# Patient Record
Sex: Female | Born: 1955 | Race: Black or African American | Hispanic: No | Marital: Single | Smoking: Former smoker
Health system: Southern US, Community
[De-identification: ages and names within clinical notes are randomized; demographics above are authoritative.]

## PROBLEM LIST (undated history)

## (undated) DIAGNOSIS — E785 Hyperlipidemia, unspecified: Secondary | ICD-10-CM

## (undated) DIAGNOSIS — M1990 Unspecified osteoarthritis, unspecified site: Secondary | ICD-10-CM

## (undated) DIAGNOSIS — I10 Essential (primary) hypertension: Secondary | ICD-10-CM

## (undated) DIAGNOSIS — F32A Depression, unspecified: Secondary | ICD-10-CM

## (undated) DIAGNOSIS — D649 Anemia, unspecified: Secondary | ICD-10-CM

## (undated) DIAGNOSIS — Z87410 Personal history of cervical dysplasia: Secondary | ICD-10-CM

## (undated) DIAGNOSIS — T7840XA Allergy, unspecified, initial encounter: Secondary | ICD-10-CM

## (undated) DIAGNOSIS — K219 Gastro-esophageal reflux disease without esophagitis: Secondary | ICD-10-CM

## (undated) DIAGNOSIS — G709 Myoneural disorder, unspecified: Secondary | ICD-10-CM

## (undated) HISTORY — DX: Allergy, unspecified, initial encounter: T78.40XA

## (undated) HISTORY — DX: Unspecified osteoarthritis, unspecified site: M19.90

## (undated) HISTORY — DX: Anemia, unspecified: D64.9

## (undated) HISTORY — DX: Myoneural disorder, unspecified: G70.9

## (undated) HISTORY — DX: Hyperlipidemia, unspecified: E78.5

## (undated) HISTORY — DX: Depression, unspecified: F32.A

## (undated) HISTORY — DX: Essential (primary) hypertension: I10

---

## 2006-12-22 HISTORY — PX: LEEP: SHX91

## 2010-11-05 ENCOUNTER — Other Ambulatory Visit: Admission: RE | Admit: 2010-11-05 | Discharge: 2010-11-05 | Payer: Self-pay | Admitting: Obstetrics and Gynecology

## 2010-11-18 ENCOUNTER — Ambulatory Visit (HOSPITAL_COMMUNITY)
Admission: RE | Admit: 2010-11-18 | Discharge: 2010-11-18 | Payer: Self-pay | Source: Home / Self Care | Admitting: Obstetrics and Gynecology

## 2011-11-03 ENCOUNTER — Other Ambulatory Visit (HOSPITAL_COMMUNITY): Payer: Self-pay | Admitting: Obstetrics and Gynecology

## 2011-11-03 DIAGNOSIS — Z1231 Encounter for screening mammogram for malignant neoplasm of breast: Secondary | ICD-10-CM

## 2011-11-11 ENCOUNTER — Other Ambulatory Visit (HOSPITAL_COMMUNITY)
Admission: RE | Admit: 2011-11-11 | Discharge: 2011-11-11 | Disposition: A | Discharge status: Home / Self Care | Payer: 59 | Source: Ambulatory Visit | Attending: Obstetrics and Gynecology | Admitting: Obstetrics and Gynecology

## 2011-11-11 DIAGNOSIS — Z01419 Encounter for gynecological examination (general) (routine) without abnormal findings: Secondary | ICD-10-CM | POA: Insufficient documentation

## 2011-12-03 ENCOUNTER — Ambulatory Visit (HOSPITAL_COMMUNITY)
Admission: RE | Admit: 2011-12-03 | Discharge: 2011-12-03 | Disposition: A | Discharge status: Home / Self Care | Payer: 59 | Source: Ambulatory Visit | Attending: Obstetrics and Gynecology | Admitting: Obstetrics and Gynecology

## 2011-12-03 DIAGNOSIS — Z1231 Encounter for screening mammogram for malignant neoplasm of breast: Secondary | ICD-10-CM | POA: Insufficient documentation

## 2016-09-23 ENCOUNTER — Other Ambulatory Visit: Payer: Self-pay | Admitting: Internal Medicine

## 2016-09-23 DIAGNOSIS — Z1231 Encounter for screening mammogram for malignant neoplasm of breast: Secondary | ICD-10-CM

## 2016-09-29 ENCOUNTER — Ambulatory Visit
Admission: RE | Admit: 2016-09-29 | Discharge: 2016-09-29 | Disposition: A | Discharge status: Home / Self Care | Payer: Commercial Managed Care - HMO | Source: Ambulatory Visit | Attending: Internal Medicine | Admitting: Internal Medicine

## 2016-09-29 DIAGNOSIS — Z1231 Encounter for screening mammogram for malignant neoplasm of breast: Secondary | ICD-10-CM

## 2019-12-29 ENCOUNTER — Ambulatory Visit: Payer: Self-pay | Attending: Internal Medicine

## 2019-12-29 DIAGNOSIS — Z20822 Contact with and (suspected) exposure to covid-19: Secondary | ICD-10-CM

## 2019-12-31 LAB — NOVEL CORONAVIRUS, NAA: SARS-CoV-2, NAA: NOT DETECTED

## 2020-03-24 ENCOUNTER — Ambulatory Visit: Payer: Self-pay | Attending: Internal Medicine

## 2020-03-24 DIAGNOSIS — Z23 Encounter for immunization: Secondary | ICD-10-CM

## 2020-03-24 NOTE — Progress Notes (Signed)
   Covid-19 Vaccination Clinic  Name:  Madison Becker    MRN: [id number] DOB: 1956-10-07  03/24/2020  [name] was observed post Covid-19 immunization for 15 minutes without incident. She was provided with Vaccine Information Sheet and instruction to access the V-Safe system.   [name] was instructed to call 911 with any severe reactions post vaccine: Marland Kitchen Difficulty breathing  . Swelling of face and throat  . A fast heartbeat  . A bad rash all over body  . Dizziness and weakness   Immunizations Administered    Name Date Dose VIS Date Route   Pfizer COVID-19 Vaccine 03/24/2020  8:23 AM 0.3 mL 12/02/2019 Intramuscular   Manufacturer: [place]   Lot: [id number]   [place]: [id number]

## 2020-04-17 ENCOUNTER — Ambulatory Visit: Payer: Self-pay

## 2020-04-23 ENCOUNTER — Ambulatory Visit: Payer: Self-pay | Attending: Internal Medicine

## 2020-04-23 DIAGNOSIS — Z23 Encounter for immunization: Secondary | ICD-10-CM

## 2020-04-23 NOTE — Progress Notes (Signed)
   Covid-19 Vaccination Clinic  Name:  Madison Becker    MRN: [id number] DOB: [birth year]  04/23/2020  [name] was observed post Covid-19 immunization for 15 minutes without incident. She was provided with Vaccine Information Sheet and instruction to access the V-Safe system.   [name] was instructed to call 911 with any severe reactions post vaccine: Marland Kitchen Difficulty breathing  . Swelling of face and throat  . A fast heartbeat  . A bad rash all over body  . Dizziness and weakness   Immunizations Administered    Name Date Dose VIS Date Route   Pfizer COVID-19 Vaccine 04/23/2020 12:35 PM 0.3 mL 02/15/2019 Intramuscular   Manufacturer: [place]   Lot: [id number]   [place]: [id number]

## 2021-09-21 DIAGNOSIS — C50919 Malignant neoplasm of unspecified site of unspecified female breast: Secondary | ICD-10-CM

## 2021-09-21 HISTORY — DX: Malignant neoplasm of unspecified site of unspecified female breast: C50.919

## 2021-10-04 ENCOUNTER — Other Ambulatory Visit: Payer: Self-pay | Admitting: Surgery

## 2021-10-09 ENCOUNTER — Other Ambulatory Visit: Payer: Self-pay | Admitting: Radiation Oncology

## 2021-10-09 ENCOUNTER — Ambulatory Visit
Admission: RE | Admit: 2021-10-09 | Discharge: 2021-10-09 | Disposition: A | Discharge status: Home / Self Care | Payer: Self-pay | Source: Ambulatory Visit | Attending: Radiation Oncology | Admitting: Radiation Oncology

## 2021-10-09 ENCOUNTER — Telehealth: Payer: Self-pay | Admitting: Hematology and Oncology

## 2021-10-09 ENCOUNTER — Inpatient Hospital Stay
Admission: RE | Admit: 2021-10-09 | Discharge: 2021-10-09 | Disposition: A | Discharge status: Home / Self Care | Payer: Self-pay | Source: Ambulatory Visit | Attending: Radiation Oncology | Admitting: Radiation Oncology

## 2021-10-09 ENCOUNTER — Other Ambulatory Visit: Payer: Self-pay | Admitting: Surgery

## 2021-10-09 DIAGNOSIS — Z853 Personal history of malignant neoplasm of breast: Secondary | ICD-10-CM

## 2021-10-09 NOTE — Telephone Encounter (Signed)
Scheduled appt per 10/19 referral. Pt is aware of appt date and time.  

## 2021-10-21 NOTE — Progress Notes (Signed)
New Breast Cancer Diagnosis: Left Breast UIQ  Did patient present with symptoms (if so, please note symptoms) or screening mammography?:Palpable mass    Location and Extent of disease :left breast. Located at 10.30 position, measured  2.2 cm in greatest dimension. Adenopathy no.  MRI Breast 11/02/2021:  Histology per Pathology Report: grade 3, Invasive Ductal Carcinoma and DCIS   Receptor Status: ER(positive), PR (positive), Her2-neu (negative), Ki-(50%)  Surgeon and surgical plan, if any:  Dr. Ninfa Linden 10/04/2021 -She has 2 separate areas in the breast, 1 with invasive cancer and the other with DCIS. -Total size of both areas is greater than 6 cm, I am uncertain of how far apart these 2 areas are. -I suspect this will eventually need a mastectomy given it is multiple quadrants. -We will get a MRI to determine the exact size as well as to make sure there is nothing in the left breast preoperatively. -Referral to medical and radiation oncology. -She reports currently she is not interested in immediate reconstruction for work reasons. -I will tentatively schedule her for a mastectomy as she is leaning that way. -She will see the oncologist and have the MRI preoperatively.   I will call her back with the results and again we will determine how to further proceed.   Medical oncologist, treatment if any:   Dr. Lindi Adie 10/28/2021  Family History of Breast/Ovarian/Prostate Cancer: Maternal great Aunt had breast cancer.  Lymphedema issues, if any: No     Pain issues, if any: has mild tenderness     SAFETY ISSUES: Prior radiation? No Pacemaker/ICD? No Possible current pregnancy? Postmenopausal Is the patient on methotrexate? No  Current Complaints / other details:

## 2021-10-22 ENCOUNTER — Other Ambulatory Visit: Payer: Self-pay

## 2021-10-22 ENCOUNTER — Encounter: Payer: Self-pay | Admitting: Radiation Oncology

## 2021-10-22 ENCOUNTER — Ambulatory Visit
Admission: RE | Admit: 2021-10-22 | Discharge: 2021-10-22 | Disposition: A | Discharge status: Home / Self Care | Payer: BC Managed Care – PPO | Source: Ambulatory Visit | Attending: Radiation Oncology | Admitting: Radiation Oncology

## 2021-10-22 VITALS — BP 139/74 | HR 64 | Temp 97.7°F | Resp 18 | Ht 64.5 in | Wt 225.2 lb

## 2021-10-22 DIAGNOSIS — Z170 Estrogen receptor positive status [ER+]: Secondary | ICD-10-CM | POA: Insufficient documentation

## 2021-10-22 DIAGNOSIS — C50212 Malignant neoplasm of upper-inner quadrant of left female breast: Secondary | ICD-10-CM | POA: Diagnosis present

## 2021-10-22 DIAGNOSIS — Z87891 Personal history of nicotine dependence: Secondary | ICD-10-CM | POA: Insufficient documentation

## 2021-10-22 DIAGNOSIS — K219 Gastro-esophageal reflux disease without esophagitis: Secondary | ICD-10-CM | POA: Diagnosis not present

## 2021-10-22 DIAGNOSIS — Z803 Family history of malignant neoplasm of breast: Secondary | ICD-10-CM | POA: Diagnosis not present

## 2021-10-22 HISTORY — DX: Personal history of cervical dysplasia: Z87.410

## 2021-10-22 HISTORY — DX: Gastro-esophageal reflux disease without esophagitis: K21.9

## 2021-10-22 NOTE — Progress Notes (Signed)
Radiation Oncology         [phone]) [phone] ________________________________  Name: Madison Becker        MRN: [id number]  Date of Service: 10/22/2021 DOB: 1956/09/13  [id number], No Pcp Per (Inactive)  [name], MD     REFERRING PHYSICIAN: [name], MD   DIAGNOSIS: The encounter diagnosis was Malignant neoplasm of upper-inner quadrant of left breast in female, estrogen receptor positive ([place]).   HISTORY OF PRESENT ILLNESS: Madison Becker is a 65 y.o. female seen at the request of Dr. Ninfa Linden who noted a palpable mass in the left breast. Diagnostic imaging revealed a mass in the 10:30 position measuring up to 2.2 cm and a separate area, the total distance was greater than 6 cm. 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 Her tumor was ER/PR positive, HER2 negative with a Ki 67 of 50%. Her DCIS was also ER/PR positive. She is planning to undergo an MRI for extent of disease on 11/02/21. She may undergo mastectomy versus breast conservation surgery. She's seen today if she is to undergo breast conservation. She is scheduled to meet with Dr. Lindi Adie as well next Monday.  PREVIOUS RADIATION THERAPY: No   PAST MEDICAL HISTORY:  Past Medical History:  Diagnosis Date   Breast cancer ([place]) 09/2021   GERD (gastroesophageal reflux disease)    History of cervical dysplasia        PAST SURGICAL HISTORY: Past Surgical History:  Procedure Laterality Date   LEEP  2008      FAMILY HISTORY:  Family History  Problem Relation Age of Onset   Hypertension Mother    Deep vein thrombosis Mother    Coronary artery disease Father    Breast cancer Maternal Aunt      SOCIAL HISTORY:  reports that she has quit smoking. Her smoking use  included cigarettes. She has never used smokeless tobacco. She reports current alcohol use. She reports that she does not use drugs. The patient is single and lives in [place]. 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   ALLERGIES: Patient has no allergy information on record.   MEDICATIONS:  No current outpatient medications on file.   No current facility-administered medications for this encounter.     REVIEW OF SYSTEMS: On review of systems, the patient reports that she is doing pretty well overall. She's trying not to over investigate her diagnosis on the Internet. She is contemplating what her recovery and cosmetic outcomes would be like between breast conservation and mastectomy options. She's done well since biopsies but has had some soreness over the biopsy location. No other complaints are verbalized.       PHYSICAL EXAM:  Wt Readings from Last 3 Encounters:  10/22/21 225 lb 4 oz (102.2 kg)   Temp Readings from Last 3 Encounters:  10/22/21 97.7 F (36.5 C) (Temporal)   BP Readings from Last 3 Encounters:  10/22/21 139/74   Pulse Readings from Last 3 Encounters:  10/22/21 64    In general this is a well appearing African American female in no acute distress. She's alert and oriented x4 and appropriate throughout the examination. Cardiopulmonary assessment is negative for acute distress and she exhibits normal effort. Bilateral breast exam is deferred.    ECOG =  1  0 - Asymptomatic (Fully active, able to carry on all predisease activities without restriction)  1 - Symptomatic but completely ambulatory (Restricted in physically strenuous activity but ambulatory and able to carry out work of a light or sedentary nature. For example, light housework, office work)  2 - Symptomatic, <50% in bed during the day (Ambulatory and capable of all self care but unable to carry out any work activities. Up and about more than 50% of waking hours)  3 -  Symptomatic, >50% in bed, but not bedbound (Capable of only limited self-care, confined to bed or chair 50% or more of waking hours)  4 - Bedbound (Completely disabled. Cannot carry on any self-care. Totally confined to bed or chair)  5 - Death   Eustace Pen MM, Creech RH, Tormey DC, et al. [phone]). "Toxicity and response criteria of the [facility]". [place] Oncol. 5 (6): 649-55    LABORATORY DATA:  No results found for: WBC, HGB, HCT, MCV, PLT No results found for: NA, K, CL, CO2 No results found for: ALT, AST, GGT, ALKPHOS, BILITOT    RADIOGRAPHY: No results found.     IMPRESSION/PLAN: 1. 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 Dr. Lisbeth Renshaw discusses the pathology findings and reviews the nature of early stage breast disease. Dr. Lisbeth Renshaw agrees with the role of MRI for extent of disease. She may either undergo breast conservation versus mastectomy with sentinel node sampling. Depending on the size of the final tumor measurements rendered by pathology, the tumor may be tested for Oncotype Dx score to determine a role for systemic therapy. Provided that chemotherapy is not indicated, the patient's course would then be followed by external radiotherapy to the breast  to reduce risks of local recurrence followed by antiestrogen therapy if she has breast conservation surgery. We discussed the risks, benefits, short, and long term effects of radiotherapy, as well as the curative intent, and the patient is interested in proceeding if she is able to undergo breast conservation. Dr. Lisbeth Renshaw discusses the delivery and logistics of radiotherapy and anticipates a course of 4 or up to 6 1/2 weeks of radiotherapy to the left breast with deep inspiration breath hold technique. We will see her back a few weeks after surgery to discuss the simulation process and anticipate we starting radiotherapy about 4-6 weeks after  surgery.    In a visit lasting 60 minutes, greater than 50% of the time was spent face to face reviewing her case, as well as in preparation of, discussing, and coordinating the patient's care.  The above documentation reflects my direct findings during this shared patient visit. Please see the separate note by Dr. Lisbeth Renshaw on this date for the remainder of the patient's plan of care.    Carola Rhine, [facility]    **Disclaimer: This note was dictated with voice recognition software. Similar sounding words can inadvertently be transcribed and this note may contain transcription errors which may not have been corrected upon publication of note.**

## 2021-10-23 ENCOUNTER — Encounter: Payer: Self-pay | Admitting: General Practice

## 2021-10-23 NOTE — Progress Notes (Signed)
[place] Psychosocial Distress Screening Clinical Social Work  Clinical Social Work was referred by distress screening protocol.  The patient scored a 6 on the Psychosocial Distress Thermometer which indicates moderate distress. Clinical Social Worker contacted patient by phone to assess for distress and other psychosocial needs. She is in the process of "trying to wrap my brain around everything."  Biggest challenge is "things you dont know."  Anxious about MRI and similar.  Looking forward to having treatment plan and clarity.  CSW and patient discussed common feeling and emotions when being diagnosed with cancer, and the importance of support during treatment.  CSW informed patient of the support team and support services at [facility].  CSW provided contact information and encouraged patient to call with any questions or concerns.   ONCBCN DISTRESS SCREENING 10/22/2021  Screening Type Initial Screening  Distress experienced in past week (1-10) 6  Emotional problem type Nervousness/Anxiety;Adjusting to illness  Information Concerns Type Lack of info about diagnosis;Lack of info about treatment  Physical Problem type Tingling hands/feet;Skin dry/itchy;Swollen arms/legs  Other Contact via phone    Clinical Social Worker follow up needed: No.  If yes, follow up plan:  Beverely Pace, [place], LCSW Clinical Social Worker Phone:  [phone]

## 2021-10-26 NOTE — Progress Notes (Incomplete)
[place] CONSULT NOTE  Patient Care Team: Patient, No Pcp Per (Inactive) as PCP - General (General Practice)  CHIEF COMPLAINTS/PURPOSE OF CONSULTATION:  Newly diagnosed breast cancer  HISTORY OF PRESENTING ILLNESS:  Madison Becker 65 y.o. female is here because of recent diagnosis of mass of the left breast. She palpated a mass within her left breast. Diagnostic mammogram and [place] on 09/16/2021 showed an irregular mass within the upper inner left breast, middle to posterior depth. She presents to the clinic today for initial evaluation and discussion of treatment options.   I reviewed her records extensively and collaborated the history with the patient.  SUMMARY OF ONCOLOGIC HISTORY: Oncology History  Malignant neoplasm of upper-inner quadrant of left breast in female, estrogen receptor positive ([place])  10/22/2021 Initial Diagnosis   Malignant neoplasm of upper-inner quadrant of left breast in female, estrogen receptor positive ([place])  She palpated a mass within her left breast. Diagnostic mammogram and [place]: an irregular mass within the upper inner left breast, middle to posterior depth.      MEDICAL HISTORY:  Past Medical History:  Diagnosis Date   Breast cancer ([place]) 09/2021   GERD (gastroesophageal reflux disease)    History of cervical dysplasia     SURGICAL HISTORY: Past Surgical History:  Procedure Laterality Date   LEEP  2008    SOCIAL HISTORY: Social History   Socioeconomic History   Marital status: Single    Spouse name: Not on file   Number of children: Not on file   Years of education: Not on file   Highest education level: Not on file  Occupational History   Not on file  Tobacco Use   Smoking status: Former    Types: Cigarettes   Smokeless tobacco: Never  Vaping Use   Vaping Use: Never used  Substance and Sexual Activity   Alcohol use: Yes   Drug use: Never   Sexual activity: Not on file  Other Topics Concern   Not on file   Social History Narrative   Not on file   Social Determinants of Health   Financial Resource Strain: Not on file  Food Insecurity: Not on file  Transportation Needs: Not on file  Physical Activity: Not on file  Stress: Not on file  Social Connections: Not on file  Intimate Partner Violence: Not At Risk   Fear of Current or Ex-Partner: No   Emotionally Abused: No   Physically Abused: No   Sexually Abused: No    FAMILY HISTORY: Family History  Problem Relation Age of Onset   Hypertension Mother    Deep vein thrombosis Mother    Coronary artery disease Father    Breast cancer Maternal Aunt     ALLERGIES:  has no allergies on file.  MEDICATIONS:  No current outpatient medications on file.   No current facility-administered medications for this visit.    REVIEW OF SYSTEMS:   Constitutional: Denies fevers, chills or abnormal night sweats Eyes: Denies blurriness of vision, double vision or watery eyes Ears, nose, mouth, throat, and face: Denies mucositis or sore throat Respiratory: Denies cough, dyspnea or wheezes Cardiovascular: Denies palpitation, chest discomfort or lower extremity swelling Gastrointestinal:  Denies nausea, heartburn or change in bowel habits Skin: Denies abnormal skin rashes Lymphatics: Denies new lymphadenopathy or easy bruising Neurological:Denies numbness, tingling or new weaknesses Behavioral/Psych: Mood is stable, no new changes  Breast: *** Denies any palpable lumps or discharge All other systems were reviewed with the patient and are negative.  PHYSICAL EXAMINATION: ECOG PERFORMANCE STATUS: {CHL ONC ECOG [phone]}  There were no vitals filed for this visit. There were no vitals filed for this visit.  GENERAL:alert, no distress and comfortable SKIN: skin color, texture, turgor are normal, no rashes or significant lesions EYES: normal, conjunctiva are pink and non-injected, sclera clear OROPHARYNX:no exudate, no erythema and lips,  buccal mucosa, and tongue normal  NECK: supple, thyroid normal size, non-tender, without nodularity LYMPH:  no palpable lymphadenopathy in the cervical, axillary or inguinal LUNGS: clear to auscultation and percussion with normal breathing effort HEART: regular rate & rhythm and no murmurs and no lower extremity edema ABDOMEN:abdomen soft, non-tender and normal bowel sounds Musculoskeletal:no cyanosis of digits and no clubbing  PSYCH: alert & oriented x 3 with fluent speech NEURO: no focal motor/sensory deficits BREAST:*** No palpable nodules in breast. No palpable axillary or supraclavicular lymphadenopathy (exam performed in the presence of a chaperone)   LABORATORY DATA:  I have reviewed the data as listed No results found for: WBC, HGB, HCT, MCV, PLT No results found for: NA, K, CL, CO2  RADIOGRAPHIC STUDIES: I have personally reviewed the radiological reports and agreed with the findings in the report.  ASSESSMENT AND PLAN:  No problem-specific Assessment & Plan notes found for this encounter.   All questions were answered. The patient knows to call the clinic with any problems, questions or concerns.   Rulon Eisenmenger, MD, MPH 10/26/2021    I, Thana Ates, am acting as scribe for [name], MD.  {Add scribe attestation statement}

## 2021-10-28 ENCOUNTER — Inpatient Hospital Stay: Payer: BC Managed Care – PPO | Attending: Hematology and Oncology | Admitting: Hematology and Oncology

## 2021-10-28 NOTE — Assessment & Plan Note (Deleted)
10/22/2021:Palpable left breast mass. Diagnostic mammogram and [place]: an irregular mass within the upper inner left breast, middle to posterior depth 2.2 cm ER 99%, PR 99%, HER2 negative, Ki-67 50%.  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  Total area of involvement 6 cm  Pathology and radiology counseling:Discussed with the patient, the details of pathology including the type of breast cancer,the clinical staging, the significance of ER, PR and HER-2/neu receptors and the implications for treatment. After reviewing the pathology in detail, we proceeded to discuss the different treatment options between surgery, radiation, chemotherapy, antiestrogen therapies.  Recommendations: 1. Breast conserving surgery followed by 2. Oncotype DX testing to determine if chemotherapy would be of any benefit followed by 3. Adjuvant radiation therapy followed by 4. Adjuvant antiestrogen therapy  Oncotype counseling: I discussed Oncotype DX test. I explained to the patient that this is a 21 gene panel to evaluate patient tumors DNA to calculate recurrence score. This would help determine whether patient has high risk or low risk breast cancer. She understands that if her tumor was found to be high risk, she would benefit from systemic chemotherapy. If low risk, no need of chemotherapy.  Return to clinic after surgery to discuss final pathology report and then determine if Oncotype DX testing will need to be sent.

## 2021-10-29 ENCOUNTER — Telehealth: Payer: Self-pay | Admitting: Hematology and Oncology

## 2021-10-29 NOTE — Telephone Encounter (Signed)
Called pt to r/s appt missed from 11/7. Pt did not answer the phone. I left a msg for her to call me back to r/s appt.

## 2021-10-30 ENCOUNTER — Telehealth: Payer: Self-pay | Admitting: Hematology and Oncology

## 2021-10-30 NOTE — Telephone Encounter (Signed)
Called pt about r/s her missed appt with Dr. Lindi Adie from 11/7. Pt didn't answer, so I left a vm requesting for the pt to call me back to r/s appt.

## 2021-10-31 ENCOUNTER — Telehealth: Payer: Self-pay | Admitting: Hematology and Oncology

## 2021-10-31 NOTE — Telephone Encounter (Signed)
Called pt to r/s her missed appt with Dr. Lindi Adie on 11/7. No answer. Left msg for pt to call back to r/s appt.

## 2021-11-02 ENCOUNTER — Ambulatory Visit
Admission: RE | Admit: 2021-11-02 | Discharge: 2021-11-02 | Disposition: A | Discharge status: Home / Self Care | Payer: BC Managed Care – PPO | Source: Ambulatory Visit | Attending: Surgery | Admitting: Surgery

## 2021-11-02 ENCOUNTER — Other Ambulatory Visit: Payer: Self-pay

## 2021-11-02 DIAGNOSIS — Z853 Personal history of malignant neoplasm of breast: Secondary | ICD-10-CM

## 2021-11-02 MED ORDER — GADOBENATE DIMEGLUMINE 529 MG/ML IV SOLN
10.0000 mL | Freq: Once | INTRAVENOUS | Status: AC | PRN
  Administered 2021-11-02: 10 mL via INTRAVENOUS

## 2021-11-05 ENCOUNTER — Other Ambulatory Visit: Payer: Self-pay | Admitting: Surgery

## 2021-11-29 ENCOUNTER — Other Ambulatory Visit: Payer: Self-pay | Admitting: Surgery

## 2021-12-10 ENCOUNTER — Telehealth: Payer: Self-pay | Admitting: Hematology and Oncology

## 2021-12-10 NOTE — Telephone Encounter (Signed)
Called pt to see if she would like to r/s her missed appt with Dr. Lindi Adie per 12/19 staff msg from nurse navigator. No answer. Left msg for pt to call back to r/s appt.

## 2021-12-19 ENCOUNTER — Telehealth: Payer: Self-pay | Admitting: Hematology and Oncology

## 2021-12-19 ENCOUNTER — Encounter: Payer: Self-pay | Admitting: *Deleted

## 2021-12-19 NOTE — Telephone Encounter (Signed)
Scheduled new patient appointment per referral. Patient is aware.

## 2022-01-02 ENCOUNTER — Inpatient Hospital Stay: Payer: BC Managed Care – PPO | Attending: Hematology and Oncology | Admitting: Hematology and Oncology

## 2022-01-02 ENCOUNTER — Other Ambulatory Visit: Payer: Self-pay

## 2022-01-02 DIAGNOSIS — Z87891 Personal history of nicotine dependence: Secondary | ICD-10-CM | POA: Diagnosis not present

## 2022-01-02 DIAGNOSIS — C50212 Malignant neoplasm of upper-inner quadrant of left female breast: Secondary | ICD-10-CM

## 2022-01-02 DIAGNOSIS — Z170 Estrogen receptor positive status [ER+]: Secondary | ICD-10-CM

## 2022-01-02 NOTE — Progress Notes (Signed)
[place] CONSULT NOTE  Patient Care Team: Patient, No Pcp Per (Inactive) as PCP - General (General Practice)  CHIEF COMPLAINTS/PURPOSE OF CONSULTATION:  Newly diagnosed left breast cancer  HISTORY OF PRESENTING ILLNESS:  Madison Becker 66 y.o. female is here because of recent diagnosis of a palpable left breast mass.  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  There was a separate area of calcifications measuring 4.5 cm that was DCIS and it was ER 90% and PR 2%.  Lymph node and axilla biopsy: Negative, breast MRI 11/04/2021: 2.5 cm irregular enhancing mass UIQ left breast IDC with DCIS.  Additional non-mass enhancement extends anterior and inferior to the mass 2.5 cm total span 7.1 cm.  I reviewed her records extensively and collaborated the history with the patient.  SUMMARY OF ONCOLOGIC HISTORY: Oncology History  Malignant neoplasm of upper-inner quadrant of left breast in female, estrogen receptor positive ([place])  10/22/2021 Initial Diagnosis   Palpable left breast mass. Diagnostic mammogram and [place]: an irregular mass within the upper inner left breast, IDC 2.2 cm ER 99%, PR 99%, HER2 negative, Ki-67 50%.  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  Total area of involvement 6 cm   10/22/2021 Cancer Staging   Staging form: Breast, AJCC 8th Edition - Clinical stage from 10/22/2021: Stage IIA (cT2, cN0(f), cM0, G3, ER+, PR+, HER2-) - Signed by [name], MD on 01/02/2022 Stage prefix: Initial diagnosis Method of lymph node assessment: Core biopsy Histologic grading system: 3 grade system    11/04/2021 Breast MRI   2.5 cm irregular enhancing mass UIQ left breast IDC with DCIS.  Additional non-mass enhancement extends anterior and inferior to the mass 2.5 cm total  span 7.1 cm.      MEDICAL HISTORY:  Past Medical History:  Diagnosis Date   Breast cancer ([place]) 09/2021   GERD (gastroesophageal reflux disease)    History of cervical dysplasia     SURGICAL HISTORY: Past Surgical History:  Procedure Laterality Date   LEEP  2008    SOCIAL HISTORY: Social History   Socioeconomic History   Marital status: Single    Spouse name: Not on file   Number of children: Not on file   Years of education: Not on file   Highest education level: Not on file  Occupational History   Not on file  Tobacco Use   Smoking status: Former    Types: Cigarettes   Smokeless tobacco: Never  Vaping Use   Vaping Use: Never used  Substance and Sexual Activity   Alcohol use: Yes   Drug use: Never   Sexual activity: Not on file  Other Topics Concern   Not on file  Social History Narrative   Not on file   Social Determinants of Health   Financial Resource Strain: Not on file  Food Insecurity: Not on file  Transportation Needs: Not on file  Physical Activity: Not on file  Stress: Not on file  Social Connections: Not on file  Intimate Partner Violence: Not At Risk   Fear of Current or Ex-Partner: No   Emotionally Abused: No   Physically Abused: No   Sexually Abused: No    FAMILY HISTORY: Family History  Problem Relation Age of Onset   Hypertension Mother    Deep  vein thrombosis Mother    Coronary artery disease Father    Breast cancer Maternal Aunt     ALLERGIES:  has no allergies on file.  MEDICATIONS:  No current outpatient medications on file.   No current facility-administered medications for this visit.    REVIEW OF SYSTEMS:   Constitutional: Denies fevers, chills or abnormal night sweats Eyes: Denies blurriness of vision, double vision or watery eyes Ears, nose, mouth, throat, and face: Denies mucositis or sore throat Respiratory: Denies cough, dyspnea or wheezes Cardiovascular: Denies palpitation, chest discomfort or lower  extremity swelling Gastrointestinal:  Denies nausea, heartburn or change in bowel habits Skin: Denies abnormal skin rashes Lymphatics: Denies new lymphadenopathy or easy bruising Neurological:Denies numbness, tingling or new weaknesses Behavioral/Psych: Mood is stable, no new changes  Breast: Palpable lump in the left breast All other systems were reviewed with the patient and are negative.  PHYSICAL EXAMINATION: ECOG PERFORMANCE STATUS: 0 - Asymptomatic  Vitals:   01/02/22 1316  BP: (!) 160/72  Pulse: 88  Resp: 18  Temp: (!) 97.2 F (36.2 C)  SpO2: 98%   Filed Weights   01/02/22 1316  Weight: 218 lb 1.6 oz (98.9 kg)    RADIOGRAPHIC STUDIES: I have personally reviewed the radiological reports and agreed with the findings in the report.  ASSESSMENT AND PLAN:  Malignant neoplasm of upper-inner quadrant of left breast in female, estrogen receptor positive ([place]) 10/22/2021: Palpable left breast mass. Diagnostic mammogram and [place]: an irregular mass within the upper inner left breast, middle to posterior depth 2.2 cm ER 99%, PR 99%, HER2 negative, Ki-67 50%.  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  Total area of involvement 6 cm 11/04/2021: Breast MRI:2.5 cm irregular enhancing mass UIQ left breast IDC with DCIS.  Additional non-mass enhancement extends anterior and inferior to the mass 2.5 cm total span 7.1 cm.  Pathology and radiology counseling:Discussed with the patient, the details of pathology including the type of breast cancer,the clinical staging, the significance of ER, PR and HER-2/neu receptors and the implications for treatment. After reviewing the pathology in detail, we proceeded to discuss the different treatment options between surgery, radiation, chemotherapy, antiestrogen therapies.  Recommendations: 1.  Mastectomy 2. Oncotype DX testing to determine if chemotherapy  would be of any benefit followed by 3. Adjuvant antiestrogen therapy  Oncotype counseling: I discussed Oncotype DX test. I explained to the patient that this is a 21 gene panel to evaluate patient tumors DNA to calculate recurrence score. This would help determine whether patient has high risk or low risk breast cancer. She understands that if her tumor was found to be high risk, she would benefit from systemic chemotherapy. If low risk, no need of chemotherapy.  Return to clinic after surgery to discuss final pathology report and then determine if Oncotype DX testing will need to be sent.    All questions were answered. The patient knows to call the clinic with any problems, questions or concerns.    [name], MD 01/02/22

## 2022-01-02 NOTE — Assessment & Plan Note (Addendum)
10/22/2021: Palpable left breast mass. Diagnostic mammogram and [place]: an irregular mass within the upper inner left breast, middle to posterior depth 2.2 cm ER 99%, PR 99%, HER2 negative, Ki-67 50%.  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  Total area of involvement 6 cm 11/04/2021: Breast MRI:2.5 cm irregular enhancing mass UIQ left breast IDC with DCIS.  Additional non-mass enhancement extends anterior and inferior to the mass 2.5 cm total span 7.1 cm.  Pathology and radiology counseling:Discussed with the patient, the details of pathology including the type of breast cancer,the clinical staging, the significance of ER, PR and HER-2/neu receptors and the implications for treatment. After reviewing the pathology in detail, we proceeded to discuss the different treatment options between surgery, radiation, chemotherapy, antiestrogen therapies.  Recommendations: 1.  Mastectomy 2. Oncotype DX testing to determine if chemotherapy would be of any benefit followed by 3. Adjuvant antiestrogen therapy  Oncotype counseling: I discussed Oncotype DX test. I explained to the patient that this is a 21 gene panel to evaluate patient tumors DNA to calculate recurrence score. This would help determine whether patient has high risk or low risk breast cancer. She understands that if her tumor was found to be high risk, she would benefit from systemic chemotherapy. If low risk, no need of chemotherapy.  Return to clinic after surgery to discuss final pathology report and then determine if Oncotype DX testing will need to be sent.

## 2022-01-03 ENCOUNTER — Encounter: Payer: Self-pay | Admitting: *Deleted

## 2022-01-03 ENCOUNTER — Telehealth: Payer: Self-pay | Admitting: *Deleted

## 2022-01-03 NOTE — Telephone Encounter (Signed)
Left message for a return phone call to follow from her new patient appt and assess navigation needs.

## 2022-01-06 ENCOUNTER — Other Ambulatory Visit: Payer: Self-pay | Admitting: Surgery

## 2022-01-09 ENCOUNTER — Encounter: Payer: Self-pay | Admitting: *Deleted

## 2022-01-13 ENCOUNTER — Telehealth: Payer: Self-pay | Admitting: *Deleted

## 2022-01-13 ENCOUNTER — Encounter: Payer: Self-pay | Admitting: *Deleted

## 2022-01-13 NOTE — Telephone Encounter (Signed)
Attempted to call patient to follow up and let her know that Dr. Trevor Mace office has been trying to reach her regarding scheduling her surgery.  Left her VM to give Madison Becker a call back as well as Dr. Trevor Mace office. Left contact information on VM.

## 2022-01-16 ENCOUNTER — Encounter: Payer: Self-pay | Admitting: *Deleted

## 2022-01-21 ENCOUNTER — Encounter: Payer: Self-pay | Admitting: *Deleted

## 2022-01-28 ENCOUNTER — Encounter: Payer: Self-pay | Admitting: *Deleted

## 2022-01-28 ENCOUNTER — Telehealth: Payer: Self-pay | Admitting: *Deleted

## 2022-01-28 NOTE — Telephone Encounter (Signed)
Left message for a return phone call regarding appts to schedule sx. [facility] has been trying to reach patient as well with no response.

## 2022-02-06 ENCOUNTER — Encounter: Payer: Self-pay | Admitting: *Deleted

## 2022-02-13 ENCOUNTER — Encounter: Payer: Self-pay | Admitting: *Deleted

## 2022-02-14 ENCOUNTER — Telehealth: Payer: Self-pay | Admitting: Hematology and Oncology

## 2022-02-14 NOTE — Telephone Encounter (Signed)
.  Called patient to schedule appointment per 2/23 inbasket, patient is aware of date and time.

## 2022-02-27 ENCOUNTER — Encounter (HOSPITAL_BASED_OUTPATIENT_CLINIC_OR_DEPARTMENT_OTHER): Payer: Self-pay | Admitting: Surgery

## 2022-03-03 ENCOUNTER — Encounter: Payer: Self-pay | Admitting: *Deleted

## 2022-03-04 MED ORDER — CHLORHEXIDINE GLUCONATE CLOTH 2 % EX PADS: 6.0000 | MEDICATED_PAD | Freq: Once | CUTANEOUS | Status: DC

## 2022-03-04 MED ORDER — ENSURE PRE-SURGERY PO LIQD: 296.0000 mL | Freq: Once | ORAL | Status: DC

## 2022-03-04 NOTE — Progress Notes (Signed)

## 2022-03-04 NOTE — H&P (Signed)
?REFERRING PHYSICIAN: Pollyann Kennedy, MD ? ?PROVIDER: Beverlee Nims, MD ? ?MRN: [id number] ?DOB: 05-Jun-1956 ?DATE OF ENCOUNTER: 10/04/2021 ?Subjective  ?Chief Complaint: New Consultation (Breast Cancer) ? ? ?History of Present Illness: ?Madison Becker is a 66 y.o. female who is seen  ? as an office consultation at the request of Dr. Quentin Cornwall for evaluation of New Consultation (Breast Cancer) ?.  ? ?She is here  ?for evaluation of the left breast cancer. She reported feeling a mass several months ago in the upper left breast. She had an upper undergoing mammograms, ultrasound, and biopsies of tubular areas in the left breast. She was found to have an invasive ductal carcinoma at the 10:30 position of the left breast measuring 2.2 cm. It was 99% ER, 99% PR positive, HER2 negative, and had a Ki-67 of 50%. ?She had a separate area of calcifications measuring 4.5 cm in the inner upper quadrant. Biopsy showed DCIS which was 90% ER and 2% PR positive. She had a lymph node biopsied in the axilla which was negative. She has no previous history of breast cancer. She denies nipple discharge. She does have a history of breast cancer in a great aunt. She is otherwise healthy without complaints. She has no cardiopulmonary issues. ? ?Review of Systems: ?A complete review of systems was obtained from the patient. I have reviewed this information and discussed as appropriate with the patient. See HPI as well for other ROS. ? ?ROS  ? ?Medical History: ?Past Medical History:  ?Diagnosis Date  ? GERD (gastroesophageal reflux disease)  ? History of cancer  ? ?Patient Active Problem List  ?Diagnosis  ? Abnormal mammogram of left breast  ? Invasive ductal carcinoma of breast, left (CMS-HCC)  ? ?Past Surgical History:  ?Procedure Laterality Date  ? CERVICAL BIOPSY W/ LOOP ELECTRODE EXCISION  ?2008  ? ? ?No Known Allergies ? ?No current outpatient medications on file prior to visit.  ? ?No current facility-administered  medications on file prior to visit.  ? ?Family History  ?Problem Relation Age of Onset  ? High blood pressure (Hypertension) Mother  ? Deep vein thrombosis (DVT or abnormal blood clot formation) Mother  ? Coronary Artery Disease (Blocked arteries around heart) Father  ? ? ?Social History  ? ?Tobacco Use  ?Smoking Status Former Smoker  ?Smokeless Tobacco Never Used  ? ? ?Social History  ? ?Socioeconomic History  ? Marital status: Single  ?Tobacco Use  ? Smoking status: Former Smoker  ? Smokeless tobacco: Never Used  ?Vaping Use  ? Vaping Use: Never used  ?Substance and Sexual Activity  ? Alcohol use: Yes  ? Drug use: Never  ? ?Objective:  ? ?Vitals:  ? ?BP: 126/82  ?Pulse: 83  ?Temp: 36.9 ?C (98.5 ?F)  ?SpO2: 98%  ?Weight: (!) 102.2 kg (225 lb 3.2 oz)  ?Height: 162.6 cm ($RemoveB'5\' 4"'LffPCVHg$ )  ? ?Body mass index is 38.66 kg/m?. ? ?Physical Exam  ? ?She appears well on exam t ? ?I can vaguely feel the mass in the upper breast at the 10:30 position. There are no other masses. There is mild ecchymosis from the biopsies. There is no axillary adenopathy ? ?Labs, Imaging and Diagnostic Testing: ?I have reviewed her pathology results as well as mammograms and ultrasound results ? ?Assessment and Plan:  ?Diagnoses and all orders for this visit: ? ?History of left breast cancer ?- Ambulatory Referral to Oncology-Medical ?- Ambulatory Referral to Radiation Oncology ? ? ? ?I had a long discussion with the  patient regarding her breast cancer. Again she has 2 separate areas in the breast 1 with invasive cancer and the other with DCIS. Total size of both areas is greater than and 6 cm. I am uncertain of how far apart these 2 areas are. We then discussed breast conservation versus mastectomy. I suspect this will eventually need a mastectomy given it is multiple quadrants. We will get an MRI to determine the exact size as well as to make sure there is nothing in the left breast preoperatively. I will refer her to medical and radiation oncology as  well for their opinion. She reports currently she is not interested in immediate reconstruction for work reasons. I will tentatively schedule her for a mastectomy as she is leaning that way. She will see the oncologist and have the MRI preoperatively. I will call her back with the results and again we will deter ? ?Addendum: The patient eventually had an MRI showing that the area of concern with the calcifications was at least 7.1 cm.  The invasive cancer measured 2.5 cm.  Given this, again, a left breast mastectomy with sentinel node biopsy is strongly recommended.  We have had a difficult time staying in touch with the patient.  She initially considered reconstruction but cannot get coordinated with plastic surgeons so she still wants to go ahead and proceed with the mastectomy and sentinel node biopsy.  We discussed the risks which includes but is not limited to bleeding, infection, injury to surrounding structures, drain placement postoperatively, the need for further surgery if the lymph nodes are positive, cardiopulmonary issues, etc.  Surgery is scheduled ?

## 2022-03-04 NOTE — Anesthesia Preprocedure Evaluation (Addendum)
Anesthesia Evaluation  ?Patient identified by MRN, date of birth, ID band ?Patient awake ? ? ? ?Reviewed: ?Allergy & Precautions, NPO status , Patient's Chart, lab work & pertinent test results ? ?Airway ?Mallampati: II ? ?TM Distance: >3 FB ? ? ? ? Dental ?  ?Pulmonary ?neg pulmonary ROS, former smoker,  ?  ?breath sounds clear to auscultation ? ? ? ? ? ? Cardiovascular ?negative cardio ROS ? ? ?Rhythm:Regular Rate:Normal ? ?History noted from [place]. As per patient work up negative. ?  ?Neuro/Psych ?  ? GI/Hepatic ?Neg liver ROS, GERD  ,  ?Endo/Other  ?negative endocrine ROS ? Renal/GU ?negative Renal ROS  ? ?  ?Musculoskeletal ? ? Abdominal ?  ?Peds ? Hematology ?  ?Anesthesia Other Findings ? ? Reproductive/Obstetrics ? ?  ? ? ? ? ? ? ? ? ? ? ? ? ? ?  ?  ? ? ? ? ? ?Anesthesia Physical ?Anesthesia Plan ? ?ASA: 3 ? ?Anesthesia Plan: General  ? ?Post-op Pain Management: Regional block*  ? ?Induction: Intravenous ? ?PONV Risk Score and Plan: 3 and Ondansetron, Dexamethasone and Midazolam ? ?Airway Management Planned: LMA ? ?Additional Equipment:  ? ?Intra-op Plan:  ? ?Post-operative Plan: Extubation in OR ? ?Informed Consent: I have reviewed the patients History and Physical, chart, labs and discussed the procedure including the risks, benefits and alternatives for the proposed anesthesia with the patient or authorized representative who has indicated his/her understanding and acceptance.  ? ? ? ?Dental advisory given ? ?Plan Discussed with: Anesthesiologist and CRNA ? ?Anesthesia Plan Comments:   ? ? ? ?Anesthesia Quick Evaluation ? ?

## 2022-03-05 ENCOUNTER — Other Ambulatory Visit: Payer: Self-pay

## 2022-03-05 ENCOUNTER — Observation Stay (HOSPITAL_BASED_OUTPATIENT_CLINIC_OR_DEPARTMENT_OTHER)
Admission: RE | Admit: 2022-03-05 | Discharge: 2022-03-06 | Disposition: A | Discharge status: Home / Self Care | Payer: BC Managed Care – PPO | Attending: Surgery | Admitting: Surgery

## 2022-03-05 ENCOUNTER — Encounter (HOSPITAL_BASED_OUTPATIENT_CLINIC_OR_DEPARTMENT_OTHER): Payer: Self-pay | Admitting: Surgery

## 2022-03-05 ENCOUNTER — Encounter (HOSPITAL_BASED_OUTPATIENT_CLINIC_OR_DEPARTMENT_OTHER)
Admission: RE | Disposition: A | Discharge status: Home / Self Care | Payer: Self-pay | Source: Home / Self Care | Attending: Surgery

## 2022-03-05 ENCOUNTER — Ambulatory Visit (HOSPITAL_BASED_OUTPATIENT_CLINIC_OR_DEPARTMENT_OTHER): Payer: BC Managed Care – PPO | Admitting: Anesthesiology

## 2022-03-05 DIAGNOSIS — C50912 Malignant neoplasm of unspecified site of left female breast: Principal | ICD-10-CM | POA: Insufficient documentation

## 2022-03-05 DIAGNOSIS — C773 Secondary and unspecified malignant neoplasm of axilla and upper limb lymph nodes: Secondary | ICD-10-CM | POA: Diagnosis not present

## 2022-03-05 DIAGNOSIS — Z9012 Acquired absence of left breast and nipple: Secondary | ICD-10-CM

## 2022-03-05 DIAGNOSIS — Z87891 Personal history of nicotine dependence: Secondary | ICD-10-CM | POA: Diagnosis not present

## 2022-03-05 SURGERY — MASTECTOMY WITH SENTINEL LYMPH NODE BIOPSY
Anesthesia: General | Site: Breast | Laterality: Left

## 2022-03-05 MED ORDER — CEFAZOLIN SODIUM-DEXTROSE 2-4 GM/100ML-% IV SOLN
2.0000 g | INTRAVENOUS | Status: AC
  Administered 2022-03-05: 2 g via INTRAVENOUS

## 2022-03-05 MED ORDER — FENTANYL CITRATE (PF) 100 MCG/2ML IJ SOLN
INTRAMUSCULAR | Status: AC
  Filled 2022-03-05: qty 2

## 2022-03-05 MED ORDER — DIPHENHYDRAMINE HCL 50 MG/ML IJ SOLN: 12.5000 mg | Freq: Four times a day (QID) | INTRAMUSCULAR | Status: DC | PRN

## 2022-03-05 MED ORDER — HYDROMORPHONE HCL 1 MG/ML IJ SOLN
0.2500 mg | INTRAMUSCULAR | Status: DC | PRN
  Administered 2022-03-05: 0.5 mg via INTRAVENOUS

## 2022-03-05 MED ORDER — ENOXAPARIN SODIUM 40 MG/0.4ML IJ SOSY: 40.0000 mg | PREFILLED_SYRINGE | INTRAMUSCULAR | Status: DC

## 2022-03-05 MED ORDER — ACETAMINOPHEN 500 MG PO TABS
1000.0000 mg | ORAL_TABLET | Freq: Four times a day (QID) | ORAL | Status: DC
  Administered 2022-03-05: 1000 mg via ORAL
  Filled 2022-03-05: qty 2

## 2022-03-05 MED ORDER — MIDAZOLAM HCL 2 MG/2ML IJ SOLN
INTRAMUSCULAR | Status: AC
  Filled 2022-03-05: qty 2

## 2022-03-05 MED ORDER — FENTANYL CITRATE (PF) 100 MCG/2ML IJ SOLN
INTRAMUSCULAR | Status: DC | PRN
  Administered 2022-03-05 (×2): 50 ug via INTRAVENOUS

## 2022-03-05 MED ORDER — DEXAMETHASONE SODIUM PHOSPHATE 4 MG/ML IJ SOLN
INTRAMUSCULAR | Status: DC | PRN
  Administered 2022-03-05: 5 mg via INTRAVENOUS

## 2022-03-05 MED ORDER — OXYCODONE HCL 5 MG PO TABS: 5.0000 mg | ORAL_TABLET | ORAL | Status: DC | PRN

## 2022-03-05 MED ORDER — SODIUM CHLORIDE 0.9 % IV SOLN: INTRAVENOUS | Status: DC

## 2022-03-05 MED ORDER — ONDANSETRON HCL 4 MG/2ML IJ SOLN
INTRAMUSCULAR | Status: AC
  Filled 2022-03-05: qty 2

## 2022-03-05 MED ORDER — BUPIVACAINE-EPINEPHRINE (PF) 0.25% -1:200000 IJ SOLN
INTRAMUSCULAR | Status: AC
  Filled 2022-03-05: qty 30

## 2022-03-05 MED ORDER — LACTATED RINGERS IV SOLN: INTRAVENOUS | Status: DC

## 2022-03-05 MED ORDER — MAGTRACE LYMPHATIC TRACER
INTRAMUSCULAR | Status: DC | PRN
  Administered 2022-03-05: 2 mL via INTRAMUSCULAR

## 2022-03-05 MED ORDER — PROPOFOL 500 MG/50ML IV EMUL
INTRAVENOUS | Status: AC
  Filled 2022-03-05: qty 50

## 2022-03-05 MED ORDER — ACETAMINOPHEN 500 MG PO TABS
ORAL_TABLET | ORAL | Status: AC
  Filled 2022-03-05: qty 2

## 2022-03-05 MED ORDER — METHOCARBAMOL 500 MG PO TABS: 500.0000 mg | ORAL_TABLET | Freq: Four times a day (QID) | ORAL | Status: DC | PRN

## 2022-03-05 MED ORDER — SUCCINYLCHOLINE CHLORIDE 200 MG/10ML IV SOSY
PREFILLED_SYRINGE | INTRAVENOUS | Status: DC | PRN
  Administered 2022-03-05: 100 mg via INTRAVENOUS

## 2022-03-05 MED ORDER — DEXAMETHASONE SODIUM PHOSPHATE 10 MG/ML IJ SOLN
INTRAMUSCULAR | Status: AC
  Filled 2022-03-05: qty 1

## 2022-03-05 MED ORDER — HYDROMORPHONE HCL 1 MG/ML IJ SOLN
INTRAMUSCULAR | Status: AC
  Filled 2022-03-05: qty 0.5

## 2022-03-05 MED ORDER — LIDOCAINE 2% (20 MG/ML) 5 ML SYRINGE
INTRAMUSCULAR | Status: AC
  Filled 2022-03-05: qty 5

## 2022-03-05 MED ORDER — BUPIVACAINE-EPINEPHRINE (PF) 0.5% -1:200000 IJ SOLN
INTRAMUSCULAR | Status: DC | PRN
  Administered 2022-03-05: 25 mL via PERINEURAL

## 2022-03-05 MED ORDER — ONDANSETRON 4 MG PO TBDP
4.0000 mg | ORAL_TABLET | Freq: Four times a day (QID) | ORAL | Status: DC | PRN
  Administered 2022-03-05: 4 mg via ORAL
  Filled 2022-03-05: qty 1

## 2022-03-05 MED ORDER — ACETAMINOPHEN 500 MG PO TABS
1000.0000 mg | ORAL_TABLET | ORAL | Status: AC
  Administered 2022-03-05: 1000 mg via ORAL

## 2022-03-05 MED ORDER — FENTANYL CITRATE (PF) 100 MCG/2ML IJ SOLN
100.0000 ug | Freq: Once | INTRAMUSCULAR | Status: AC
  Administered 2022-03-05: 50 ug via INTRAVENOUS

## 2022-03-05 MED ORDER — MORPHINE SULFATE (PF) 4 MG/ML IV SOLN: 1.0000 mg | INTRAVENOUS | Status: DC | PRN

## 2022-03-05 MED ORDER — ONDANSETRON HCL 4 MG/2ML IJ SOLN
INTRAMUSCULAR | Status: DC | PRN
  Administered 2022-03-05: 4 mg via INTRAVENOUS

## 2022-03-05 MED ORDER — DIPHENHYDRAMINE HCL 12.5 MG/5ML PO ELIX: 12.5000 mg | ORAL_SOLUTION | Freq: Four times a day (QID) | ORAL | Status: DC | PRN

## 2022-03-05 MED ORDER — PROPOFOL 10 MG/ML IV BOLUS
INTRAVENOUS | Status: DC | PRN
  Administered 2022-03-05: 200 mg via INTRAVENOUS

## 2022-03-05 MED ORDER — MIDAZOLAM HCL 2 MG/2ML IJ SOLN
2.0000 mg | Freq: Once | INTRAMUSCULAR | Status: AC
Start: 2022-03-05 — End: 2022-03-05
  Administered 2022-03-05: 1.5 mg via INTRAVENOUS

## 2022-03-05 MED ORDER — LIDOCAINE HCL (CARDIAC) PF 100 MG/5ML IV SOSY
PREFILLED_SYRINGE | INTRAVENOUS | Status: DC | PRN
Start: 2022-03-05 — End: 2022-03-05
  Administered 2022-03-05: 40 mg via INTRAVENOUS

## 2022-03-05 MED ORDER — ONDANSETRON HCL 4 MG/2ML IJ SOLN: 4.0000 mg | Freq: Four times a day (QID) | INTRAMUSCULAR | Status: DC | PRN

## 2022-03-05 MED ORDER — CEFAZOLIN SODIUM-DEXTROSE 2-4 GM/100ML-% IV SOLN
INTRAVENOUS | Status: AC
  Filled 2022-03-05: qty 100

## 2022-03-05 MED ORDER — TRAMADOL HCL 50 MG PO TABS: 50.0000 mg | ORAL_TABLET | Freq: Four times a day (QID) | ORAL | Status: DC | PRN

## 2022-03-05 MED ORDER — BUPIVACAINE-EPINEPHRINE (PF) 0.5% -1:200000 IJ SOLN
INTRAMUSCULAR | Status: AC
  Filled 2022-03-05: qty 30

## 2022-03-05 MED ORDER — 0.9 % SODIUM CHLORIDE (POUR BTL) OPTIME
TOPICAL | Status: DC | PRN
  Administered 2022-03-05: 800 mL

## 2022-03-05 SURGICAL SUPPLY — 56 items
APPLIER CLIP 9.375 MED OPEN (MISCELLANEOUS) ×6
BINDER BREAST XXLRG (GAUZE/BANDAGES/DRESSINGS) ×2 IMPLANT
BLADE CLIPPER SURG (BLADE) IMPLANT
BLADE HEX COATED 2.75 (ELECTRODE) ×1 IMPLANT
BLADE SURG 15 STRL LF DISP TIS (BLADE) ×1 IMPLANT
BLADE SURG 15 STRL SS (BLADE) ×1
CANISTER SUCT 1200ML W/VALVE (MISCELLANEOUS) ×2 IMPLANT
CHLORAPREP W/TINT 26 (MISCELLANEOUS) ×2 IMPLANT
CLIP APPLIE 9.375 MED OPEN (MISCELLANEOUS) IMPLANT
COVER BACK TABLE 60X90IN (DRAPES) ×2 IMPLANT
COVER MAYO STAND STRL (DRAPES) ×2 IMPLANT
COVER PROBE W GEL 5X96 (DRAPES) ×2 IMPLANT
DERMABOND ADVANCED (GAUZE/BANDAGES/DRESSINGS) ×2
DERMABOND ADVANCED .7 DNX12 (GAUZE/BANDAGES/DRESSINGS) ×1 IMPLANT
DRAIN CHANNEL 19F RND (DRAIN) ×3 IMPLANT
DRAPE LAPAROSCOPIC ABDOMINAL (DRAPES) ×2 IMPLANT
DRAPE UTILITY XL STRL (DRAPES) ×2 IMPLANT
DRSG PAD ABDOMINAL 8X10 ST (GAUZE/BANDAGES/DRESSINGS) ×2 IMPLANT
ELECT REM PT RETURN 9FT ADLT (ELECTROSURGICAL) ×2
ELECTRODE REM PT RTRN 9FT ADLT (ELECTROSURGICAL) ×1 IMPLANT
EVACUATOR SILICONE 100CC (DRAIN) ×3 IMPLANT
GAUZE SPONGE 4X4 12PLY STRL LF (GAUZE/BANDAGES/DRESSINGS) IMPLANT
GLOVE SURG POLYISO LF SZ7 (GLOVE) ×1 IMPLANT
GLOVE SURG SIGNA 7.5 PF LTX (GLOVE) ×2 IMPLANT
GLOVE SURG UNDER POLY LF SZ7 (GLOVE) ×4 IMPLANT
GOWN STRL REUS W/ TWL LRG LVL3 (GOWN DISPOSABLE) ×1 IMPLANT
GOWN STRL REUS W/ TWL XL LVL3 (GOWN DISPOSABLE) ×1 IMPLANT
GOWN STRL REUS W/TWL LRG LVL3 (GOWN DISPOSABLE) ×2
GOWN STRL REUS W/TWL XL LVL3 (GOWN DISPOSABLE) ×2
HEMOSTAT ARISTA ABSORB 3G PWDR (HEMOSTASIS) ×2 IMPLANT
HEMOSTAT SURGICEL 2X14 (HEMOSTASIS) IMPLANT
LIGHT WAVEGUIDE WIDE FLAT (MISCELLANEOUS) ×1 IMPLANT
NDL HYPO 25X1 1.5 SAFETY (NEEDLE) ×2 IMPLANT
NDL SAFETY ECLIPSE 18X1.5 (NEEDLE) ×1 IMPLANT
NEEDLE HYPO 18GX1.5 SHARP (NEEDLE)
NEEDLE HYPO 25X1 1.5 SAFETY (NEEDLE) IMPLANT
NS IRRIG 1000ML POUR BTL (IV SOLUTION) ×2 IMPLANT
PACK BASIN DAY SURGERY FS (CUSTOM PROCEDURE TRAY) ×2 IMPLANT
PENCIL SMOKE EVACUATOR (MISCELLANEOUS) ×2 IMPLANT
PIN SAFETY STERILE (MISCELLANEOUS) ×2 IMPLANT
SLEEVE SCD COMPRESS KNEE MED (STOCKING) ×2 IMPLANT
SPIKE FLUID TRANSFER (MISCELLANEOUS) IMPLANT
SPONGE T-LAP 18X18 ~~LOC~~+RFID (SPONGE) ×4 IMPLANT
SUT ETHILON 3 0 PS 1 (SUTURE) ×2 IMPLANT
SUT MNCRL AB 4-0 PS2 18 (SUTURE) ×3 IMPLANT
SUT VIC AB 2-0 SH 27 (SUTURE) ×1
SUT VIC AB 2-0 SH 27XBRD (SUTURE) IMPLANT
SUT VIC AB 3-0 SH 27 (SUTURE)
SUT VIC AB 3-0 SH 27X BRD (SUTURE) ×1 IMPLANT
SUT VICRYL 3-0 CR8 SH (SUTURE) ×2 IMPLANT
SYR BULB EAR ULCER 3OZ GRN STR (SYRINGE) ×1 IMPLANT
SYR CONTROL 10ML LL (SYRINGE) ×2 IMPLANT
TOWEL GREEN STERILE FF (TOWEL DISPOSABLE) ×2 IMPLANT
TRACER MAGTRACE VIAL (MISCELLANEOUS) ×1 IMPLANT
TUBE CONNECTING 20X1/4 (TUBING) ×2 IMPLANT
YANKAUER SUCT BULB TIP NO VENT (SUCTIONS) ×2 IMPLANT

## 2022-03-05 NOTE — Anesthesia Procedure Notes (Addendum)
?  Anesthesia Regional Block: Pectoralis block  ? ?Pre-Anesthetic Checklist: , timeout performed,  Correct Patient, Correct Site, Correct Laterality,  Correct Procedure, Correct Position, site marked,  Risks and benefits discussed,  Surgical consent,  Pre-op evaluation,  At surgeon's request and post-op pain management ? ?Laterality: Left ? ?Prep: chloraprep     ?  ?Needles:  ? ?Needle Type: Echogenic Stimulator Needle   ? ? ? ? ? ? ? ?Additional Needles: ? ? ?Procedures: Doppler guided,,,, ultrasound used (permanent image in chart),,    ?Narrative:  ?Start time: 03/05/2022 9:15 AM ?End time: 03/05/2022 9:30 AM ?Injection made incrementally with aspirations every 5 mL. ? ?Performed by: Personally  ?Anesthesiologist: Belinda Block, MD ? ? ? ?

## 2022-03-05 NOTE — Op Note (Signed)
? ?Madison Becker ?03/05/2022 ? ? ?Pre-op Diagnosis: LEFT BREAST CANCER ?    ?Post-op Diagnosis: same ? ?Procedure(s): ?LEFT MASTECTOMY WITH DEEP LEFT AXILLARY SENTINEL LYMPH NODE BIOPSY ? ? ?Surgeon(s): ?[name], MD ? ?Assistant:  Albin Felling, MD [facility] Resident ? ?Anesthesia: General ? ?Staff:  ?Circulator: Ted Mcalpine, RN ?Scrub Person: Matthew Folks, CST ?Circulator Assistant: Izora Ribas, RN ?Float Surgical Tech: Lorenza Burton, CST ? ?Estimated Blood Loss: less than 50 mL ?              ?Specimens: sent to path ? ?Indications: This is a 66 year old female who was found to have a large invasive ductal carcinoma of the left breast.  Initial preoperative imaging showed no enlarged lymph nodes.  The decision was made to proceed with a mastectomy given the size of the malignancy with a sentinel node biopsy ? ?Findings: The patient was found to have a very large sentinel lymph node.  There was extensive adenopathy in the axilla so a larger nodal package was taken as well. ? ?Procedure: The patient was brought to operating identifies the correct patient.  She is placed upon the operating table general anesthesia was induced.  Her left breast was prepped with alcohol and mag trace was injected into the nipple areolar complex.  The left breast was then gently massaged.  Next her left breast, chest, and axilla were prepped and draped in usual sterile fashion.  We created an elliptical incision transversely across the breast incorporating the nipple areolar complex with a scalpel.  We then dissected down to the breast tissue with electrocautery.  The superior skin flap was then dissected with the cautery going toward the level of the clavicle and down to the chest wall.  The large palpable mass can be palpated at approximately 10 o'clock position of the breast.  The inferior skin flap was then dissected with the cautery toward the inframammary ridge and then down to the chest  wall as well.  We carried the dissection laterally toward the axilla.  The patient had 1 large palpable lymph node which was at least 3 cm in size along with other adenopathy in the axilla.  The breast was dissected off of the chest wall medial to lateral with electrocautery.  Perforating vessels were controlled with a electrocautery and surgical clips.  We then reached the level of the axilla.  The mag trace probe was brought to the field.  The large palpable node was the sentinel node with some surrounding nodes.  This was taken out with the cautery and sent separately as the sentinel nodes.  Because of the extensive adenopathy the decision was made to continue the mastectomy taking the axillary fat pad as well.  The long thoracic and thoracodorsal nerves were easily identified and spared.  Several bridging veins and lymphatics were controlled with surgical clips.  We completed the dissection removing the rest of the breast and the nodal package.  This was sent to pathology for evaluation as well.  We then thoroughly irrigated the chest wall, skin flaps, and axilla with saline.  We placed Arista powder around the skin flaps including the chest wall and into the axilla.  Hemostasis peer to be achieved.  We made 2 separate skin incisions and placed 2 separate 19 French Blake drains with 1 going into the axilla and one along the chest wall.  These were sutured in place with nylon sutures.  The subcutaneous tissue was then closed  with interrupted 3-0 Vicryl sutures and the skin was closed with running 4-0 Monocryl.  Dermabond was then applied.  The drains were placed to bulb suction and held suction well.  ABDs and a breast binder were then applied.  The patient tolerated the procedure well.  All the counts were correct at the end of the procedure.  The patient was then extubated in the operating room and taken in stable condition to the recovery room. ? ? ?        ? ?[name]  ? ?Date: 03/05/2022  Time: 11:36  AM ? ? ? ?

## 2022-03-05 NOTE — Anesthesia Postprocedure Evaluation (Signed)
Anesthesia Post Note ? ?Patient: Madison Becker ? ?Procedure(s) Performed: LEFT MASTECTOMY WITH SENTINEL LYMPH NODE BIOPSY (Left: Breast) ? ?  ? ?Patient location during evaluation: PACU ?Anesthesia Type: General and Regional ?Level of consciousness: awake ?Pain management: pain level controlled ?Vital Signs Assessment: post-procedure vital signs reviewed and stable ?Respiratory status: spontaneous breathing, nonlabored ventilation, respiratory function stable and patient connected to nasal cannula oxygen ?Cardiovascular status: blood pressure returned to baseline and stable ?Postop Assessment: no apparent nausea or vomiting ?Anesthetic complications: no ? ? ?No notable events documented. ? ?Last Vitals:  ?Vitals:  ? 03/05/22 1245 03/05/22 1315  ?BP: (!) 159/97 (!) 162/84  ?Pulse: (!) 54 (!) 59  ?Resp: 19 16  ?Temp:  36.7 ?C  ?SpO2: 99% 96%  ?  ?Last Pain:  ?Vitals:  ? 03/05/22 1315  ?TempSrc:   ?PainSc: 3   ? ? ?  ?  ?  ?  ?  ?  ? ?Ileta Ofarrell P Derrico Zhong ? ? ? ? ?

## 2022-03-05 NOTE — Anesthesia Procedure Notes (Signed)
Procedure Name: Intubation ?Date/Time: 03/05/2022 10:00 AM ?Performed by: Maryella Shivers, CRNA ?Pre-anesthesia Checklist: Patient identified, Emergency Drugs available, Suction available and Patient being monitored ?Patient Re-evaluated:Patient Re-evaluated prior to induction ?Oxygen Delivery Method: Circle system utilized ?Preoxygenation: Pre-oxygenation with 100% oxygen ?Induction Type: IV induction ?Ventilation: Mask ventilation without difficulty ?Laryngoscope Size: Mac and 3 ?Grade View: Grade I ?Tube type: Oral ?Tube size: 7.0 mm ?Number of attempts: 1 ?Airway Equipment and Method: Stylet and Oral airway ?Placement Confirmation: ETT inserted through vocal cords under direct vision, positive ETCO2 and breath sounds checked- equal and bilateral ?Secured at: 21 cm ?Tube secured with: Tape ?Dental Injury: Teeth and Oropharynx as per pre-operative assessment  ? ? ? ? ?

## 2022-03-05 NOTE — Interval H&P Note (Signed)
History and Physical Interval Note:no change in H and P ? ?03/05/2022 ?9:40 AM ? ?Madison Becker  has presented today for surgery, with the diagnosis of LEFT BREAST CANCER.  The various methods of treatment have been discussed with the patient and family. After consideration of risks, benefits and other options for treatment, the patient has consented to  Procedure(s): ?LEFT MASTECTOMY WITH SENTINEL LYMPH NODE BIOPSY (Left) as a surgical intervention.  The patient's history has been reviewed, patient examined, no change in status, stable for surgery.  I have reviewed the patient's chart and labs.  Questions were answered to the patient's satisfaction.   ? ? ?[name] ? ? ?

## 2022-03-05 NOTE — Transfer of Care (Signed)
Immediate Anesthesia Transfer of Care Note ? ?Patient: Madison Becker ? ?Procedure(s) Performed: LEFT MASTECTOMY WITH SENTINEL LYMPH NODE BIOPSY (Left: Breast) ? ?Patient Location: PACU ? ?Anesthesia Type:GA combined with regional for post-op pain ? ?Level of Consciousness: sedated ? ?Airway & Oxygen Therapy: Patient Spontanous Breathing and Patient connected to face mask oxygen ? ?Post-op Assessment: Report given to RN and Post -op Vital signs reviewed and stable ? ?Post vital signs: Reviewed and stable ? ?Last Vitals:  ?Vitals Value Taken Time  ?BP 153/82 03/05/22 1150  ?Temp    ?Pulse 75 03/05/22 1152  ?Resp 10 03/05/22 1152  ?SpO2 100 % 03/05/22 1152  ?Vitals shown include unvalidated device data. ? ?Last Pain:  ?Vitals:  ? 03/05/22 0826  ?TempSrc: Oral  ?PainSc: 0-No pain  ?   ? ?Patients Stated Pain Goal: 4 (03/05/22 7949) ? ?Complications: No notable events documented. ?

## 2022-03-05 NOTE — Progress Notes (Signed)
Assisted Dr. Nyoka Cowden with left, ultrasound guided, pectoralis block. Side rails up, monitors on throughout procedure. See vital signs in flow sheet. Tolerated Procedure well. ?

## 2022-03-06 ENCOUNTER — Encounter (HOSPITAL_BASED_OUTPATIENT_CLINIC_OR_DEPARTMENT_OTHER): Payer: Self-pay | Admitting: Surgery

## 2022-03-06 MED ORDER — TRAMADOL HCL 50 MG PO TABS: 50.0000 mg | ORAL_TABLET | Freq: Four times a day (QID) | ORAL | 1 refills | Status: DC | PRN

## 2022-03-06 NOTE — Discharge Instructions (Addendum)
[facility] [place] [phone]  MASTECTOMY: POST OP INSTRUCTIONS  Always review your discharge instruction sheet given to you by the facility where your surgery was performed. IF YOU HAVE DISABILITY OR FAMILY LEAVE FORMS, YOU MUST BRING THEM TO THE OFFICE FOR PROCESSING.   DO NOT GIVE THEM TO YOUR DOCTOR. A prescription for pain medication may be given to you upon discharge.  Take your pain medication as prescribed, if needed.  If narcotic pain medicine is not needed, then you may take acetaminophen (Tylenol) or ibuprofen (Advil) as needed. Take your usually prescribed medications unless otherwise directed. If you need a refill on your pain medication, please contact your pharmacy.  They will contact our office to request authorization.  Prescriptions will not be filled after 5pm or on week-ends. You should follow a light diet the first few days after arrival home, such as soup and crackers, etc.  Resume your normal diet the day after surgery. Most patients will experience some swelling and bruising on the chest and underarm.  Ice packs will help.  Swelling and bruising can take several days to resolve.  It is common to experience some constipation if taking pain medication after surgery.  Increasing fluid intake and taking a stool softener (such as Colace) will usually help or prevent this problem from occurring.  A mild laxative (Milk of Magnesia or Miralax) should be taken according to package instructions if there are no bowel movements after 48 hours. Unless discharge instructions indicate otherwise, leave your bandage dry and in place until your next appointment in 3-5 days.  You may take a limited sponge bath.  No tube baths or showers until the drains are removed.  You may have steri-strips (small skin tapes) in place directly over the incision.  These strips should be left on the skin for 7-10 days.  If your surgeon used skin glue on the incision, you may shower in 24 hours.   The glue will flake off over the next 2-3 weeks.  Any sutures or staples will be removed at the office during your follow-up visit. DRAINS:  If you have drains in place, it is important to keep a list of the amount of drainage produced each day in your drains.  Before leaving the hospital, you should be instructed on drain care.  Call our office if you have any questions about your drains. ACTIVITIES:  You may resume regular (light) daily activities beginning the next day--such as daily self-care, walking, climbing stairs--gradually increasing activities as tolerated.  You may have sexual intercourse when it is comfortable.  Refrain from any heavy lifting or straining until approved by your doctor. You may drive when you are no longer taking prescription pain medication, you can comfortably wear a seatbelt, and you can safely maneuver your car and apply brakes. RETURN TO WORK:  __________________________________________________________ You should see your doctor in the office for a follow-up appointment approximately 3-5 days after your surgery.  Your doctor's nurse will typically make your follow-up appointment when she calls you with your pathology report.  Expect your pathology report 2-3 business days after your surgery.  You may call to check if you do not hear from us after three days.   OTHER INSTRUCTIONS: ______________________________________________________________________________________________ ____________________________________________________________________________________________ WHEN TO CALL YOUR DOCTOR: Fever over 101.0 Nausea and/or vomiting Extreme swelling or bruising Continued bleeding from incision. Increased pain, redness, or drainage from the incision. The clinic staff is available to answer your questions during regular business hours.  Please don't hesitate   to call and ask to speak to one of the nurses for clinical concerns.  If you have a medical emergency, go to the  nearest emergency room or call 911.  A surgeon from [facility] is always on call at the hospital. [address], [address], [place], [place]  [address] ? [address] [address], [place], [place]   [address] [phone] ? [phone] ? FAX [phone] Web site: [email or web address]   About my Jackson-Pratt Bulb Drain  What is a Jackson-Pratt bulb? A Jackson-Pratt is a soft, round device used to collect drainage. It is connected to a long, thin drainage catheter, which is held in place by one or two small stiches near your surgical incision site. When the bulb is squeezed, it forms a vacuum, forcing the drainage to empty into the bulb.  Emptying the Jackson-Pratt bulb- To empty the bulb: 1. Release the plug on the top of the bulb. 2. Pour the bulb's contents into a measuring container which your nurse will provide. 3. Record the time emptied and amount of drainage. Empty the drain(s) as often as your     doctor or nurse recommends.  Date                  Time                    Amount (Drain 1)                 Amount (Drain 2)  _____________________________________________________________________  _____________________________________________________________________  _____________________________________________________________________  _____________________________________________________________________  _____________________________________________________________________  _____________________________________________________________________  _____________________________________________________________________  _____________________________________________________________________  Squeezing the Jackson-Pratt Bulb- To squeeze the bulb: 1. Make sure the plug at the top of the bulb is open. 2. Squeeze the bulb tightly in your fist. You will hear air squeezing from the bulb. 3. Replace the plug while the bulb is squeezed. 4. Use a safety pin to attach the bulb to your clothing.  This will keep the catheter from     pulling at the bulb insertion site.  When to call your doctor- Call your doctor if: Drain site becomes red, swollen or hot. You have a fever greater than 101 degrees F. There is oozing at the drain site. Drain falls out (apply a guaze bandage over the drain hole and secure it with tape). Drainage increases daily not related to activity patterns. (You will usually have more drainage when you are active than when you are resting.) Drainage has a bad odor.  

## 2022-03-06 NOTE — Discharge Summary (Signed)
Physician Discharge Summary  ?Patient ID: ?Madison Becker ?MRN: [id number] ?DOB/AGE: Aug 02, 1956 66 y.o. ? ?Admit date: 03/05/2022 ?Discharge date: 03/06/2022 ? ?Admission Diagnoses: ? ?Discharge Diagnoses:  ?Principal Problem: ?  S/P left mastectomy ? ? ?Discharged Condition: good ? ?Hospital Course: uneventful post op course.  Discharged home POD#1 ? ?Consults: None ? ?Significant Diagnostic Studies:  ? ?Treatments: surgery: left mastectomy with sentinel node biopsy ? ?Discharge Exam: ?Blood pressure (!) 143/72, pulse 76, temperature 98.4 ?F (36.9 ?C), resp. rate 18, height '5\' 4"'$  (1.626 m), weight 97.9 kg, SpO2 99 %. ?General appearance: alert, cooperative, and no distress ?Resp: clear to auscultation bilaterally ?Cardio: regular rate and rhythm, S1, S2 normal, no murmur, click, rub or gallop ?Incision/Wound:left mastectomy flaps viable, drains serosang ? ?Disposition: Discharge disposition: 01-Home or Self Care ? ? ? ? ? ? ?Discharge Instructions   ? ? Diet - low sodium heart healthy   Complete by: As directed ?  ? Increase activity slowly   Complete by: As directed ?  ? ?  ? ?Allergies as of 03/06/2022   ?No Known Allergies ?  ? ?  ?Medication List  ?  ? ?TAKE these medications   ? ?traMADol 50 MG tablet ?Commonly known as: ULTRAM ?Take 1 tablet (50 mg total) by mouth every 6 (six) hours as needed for moderate pain or severe pain. ?  ? ?  ? ? Follow-up Information   ? ? [name], MD. Schedule an appointment as soon as possible for a visit on 03/17/2022.   ?Specialty: General Surgery ?Contact information: ?[address] ?[address] ?[place] [address] ?[phone] ? ? ?  ?  ? ?  ?  ? ?  ? ? ?Signed: ?[name] ?03/06/2022, 6:56 AM ? ? ?

## 2022-03-11 ENCOUNTER — Encounter: Payer: Self-pay | Admitting: *Deleted

## 2022-03-11 ENCOUNTER — Telehealth: Payer: Self-pay | Admitting: *Deleted

## 2022-03-11 NOTE — Telephone Encounter (Signed)
Received order for oncotype testing. Requisiton faxed to pathology and [place] ?

## 2022-03-12 LAB — SURGICAL PATHOLOGY

## 2022-03-14 NOTE — Progress Notes (Signed)
? ?Patient Care Team: ?Patient, No Pcp Per (Inactive) as PCP - General (General Practice) ?[name], RN as Oncology Nurse Navigator ?[name], RN as Oncology Nurse Navigator ?[name], MD as Consulting Physician (Hematology and Oncology) ? ?DIAGNOSIS:  ?Encounter Diagnosis  ?Name Primary?  ? Malignant neoplasm of upper-inner quadrant of left breast in female, estrogen receptor positive ([place])   ? ? ?SUMMARY OF ONCOLOGIC HISTORY: ?Oncology History  ?Malignant neoplasm of upper-inner quadrant of left breast in female, estrogen receptor positive ([place])  ?10/22/2021 Initial Diagnosis  ? Palpable left breast mass. Diagnostic mammogram and [place]: an irregular mass within the upper inner left breast, IDC 2.2 cm ER 99%, PR 99%, HER2 negative, Ki-67 50%.  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  Total area of involvement 6 cm ?  ?10/22/2021 Cancer Staging  ? Staging form: Breast, AJCC 8th Edition ?- Clinical stage from 10/22/2021: Stage IIA (cT2, cN0(f), cM0, G3, ER+, PR+, HER2-) - Signed by [name], MD on 01/02/2022 ?Stage prefix: Initial diagnosis ?Method of lymph node assessment: Core biopsy ?Histologic grading system: 3 grade system ? ?  ?11/04/2021 Breast MRI  ? 2.5 cm irregular enhancing mass UIQ left breast IDC with DCIS.  Additional non-mass enhancement extends anterior and inferior to the mass 2.5 cm total span 7.1 cm. ?  ?03/05/2022 Surgery  ? Left mastectomy: Grade 3 IDC 2.7 cm, focal high-grade DCIS, margins negative, 1/2 lymph nodes positive ER 99%, PR 99%, HER2 negative, Ki-67 50% ?  ? ? ?CHIEF COMPLIANT: Newly diagnosed left breast cancer ? ?INTERVAL HISTORY: Madison Becker is a 66 y.o. female is here to follow-up on recent surgery.  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  The tumor was ER/PR positive HER2  negative with a Ki-67 of 50%.  She is recovering very well from surgery.  She reports that the pain is manageable.  She has an appointment with Dr. Ninfa Linden today.  She is here today to discuss the pathology report. ? ? ?ALLERGIES:  has No Known Allergies. ? ?MEDICATIONS:  ?Current Outpatient Medications  ?Medication Sig Dispense Refill  ? traMADol (ULTRAM) 50 MG tablet Take 1 tablet (50 mg total) by mouth every 6 (six) hours as needed for moderate pain or severe pain. 30 tablet 1  ? ?No current facility-administered medications for this visit.  ? ? ?PHYSICAL EXAMINATION: ?ECOG PERFORMANCE STATUS: 1 - Symptomatic but completely ambulatory ? ?Vitals:  ? 03/17/22 0918  ?BP: 140/68  ?Pulse: 92  ?Resp: 17  ?Temp: 97.8 ?F (36.6 ?C)  ?SpO2: 100%  ? ?Filed Weights  ? 03/17/22 0918  ?Weight: 218 lb 14.4 oz (99.3 kg)  ? ? ASSESSMENT & PLAN:  ?Malignant neoplasm of upper-inner quadrant of left breast in female, estrogen receptor positive ([place]) ?03/05/2022:Left mastectomy: Grade 3 IDC 2.7 cm, focal high-grade DCIS, margins negative, 1/2 lymph nodes positive ER 99%, PR 99%, HER2 negative, Ki-67 50% ? ?Pathology counseling: I discussed the final pathology report of the patient provided  a copy of this report. I discussed the margins as well as lymph node surgeries. We also discussed the final staging along with previously performed ER/PR and HER-2/neu testing. ? ?Treatment plan: ?1.  Oncotype DX testing to determine chemotherapy benefit ?2.  Adjuvant radiation ?3.  Adjuvant antiestrogen therapy ? ?Patient had many questions related to  if she were to need chemotherapy what type of chemotherapy and how many treatments etc. ?She also inquired about how many radiation treatments and what to expect from all of that. ?I broadly gave her an overview of chemo and radiation but we will discuss both of these things after we see the Oncotype score.  The specifics of chemotherapy regimen will depend on her Oncotype score. ? ?Return to clinic  based upon Oncotype DX test result ? ? ? ?No orders of the defined types were placed in this encounter. ? ?The patient has a good understanding of the overall plan. she agrees with it. she will call with any problems that may develop before the next visit here. ?Total time spent: 30 mins including face to face time and time spent for planning, charting and co-ordination of care ? ? [name], MD ?03/17/22 ? ? ? I Gardiner Coins am scribing for Dr. Lindi Adie ? ?I have reviewed the above documentation for accuracy and completeness, and I agree with the above. ?. ?

## 2022-03-17 ENCOUNTER — Inpatient Hospital Stay: Payer: BC Managed Care – PPO | Attending: Hematology and Oncology | Admitting: Hematology and Oncology

## 2022-03-17 ENCOUNTER — Other Ambulatory Visit: Payer: Self-pay

## 2022-03-17 DIAGNOSIS — Z9012 Acquired absence of left breast and nipple: Secondary | ICD-10-CM | POA: Insufficient documentation

## 2022-03-17 DIAGNOSIS — Z170 Estrogen receptor positive status [ER+]: Secondary | ICD-10-CM | POA: Insufficient documentation

## 2022-03-17 DIAGNOSIS — C50212 Malignant neoplasm of upper-inner quadrant of left female breast: Secondary | ICD-10-CM | POA: Diagnosis present

## 2022-03-17 NOTE — Assessment & Plan Note (Addendum)
03/05/2022:Left mastectomy: Grade 3 IDC 2.7 cm, focal high-grade DCIS, margins negative, 1/2 lymph nodes positive ER 99%, PR 99%, HER2 negative, Ki-67 50% ? ?Pathology counseling: I discussed the final pathology report of the patient provided  a copy of this report. I discussed the margins as well as lymph node surgeries. We also discussed the final staging along with previously performed ER/PR and HER-2/neu testing. ? ?Treatment plan: ?1.  Oncotype DX testing to determine chemotherapy benefit ?2.  Adjuvant radiation ?3.  Adjuvant antiestrogen therapy ? ?Return to clinic based upon Oncotype DX test result ?

## 2022-03-25 ENCOUNTER — Other Ambulatory Visit: Payer: Self-pay | Admitting: *Deleted

## 2022-03-25 ENCOUNTER — Other Ambulatory Visit: Payer: Self-pay

## 2022-03-25 ENCOUNTER — Telehealth: Payer: Self-pay | Admitting: *Deleted

## 2022-03-25 ENCOUNTER — Encounter: Payer: Self-pay | Admitting: Emergency Medicine

## 2022-03-25 ENCOUNTER — Encounter: Payer: Self-pay | Admitting: *Deleted

## 2022-03-25 ENCOUNTER — Inpatient Hospital Stay: Payer: BC Managed Care – PPO | Attending: Hematology and Oncology | Admitting: Hematology and Oncology

## 2022-03-25 VITALS — BP 149/82 | HR 71 | Temp 98.1°F | Resp 18 | Ht 64.0 in | Wt 217.0 lb

## 2022-03-25 DIAGNOSIS — Z5111 Encounter for antineoplastic chemotherapy: Secondary | ICD-10-CM | POA: Insufficient documentation

## 2022-03-25 DIAGNOSIS — C50212 Malignant neoplasm of upper-inner quadrant of left female breast: Secondary | ICD-10-CM

## 2022-03-25 DIAGNOSIS — Z5189 Encounter for other specified aftercare: Secondary | ICD-10-CM | POA: Diagnosis not present

## 2022-03-25 DIAGNOSIS — Z79899 Other long term (current) drug therapy: Secondary | ICD-10-CM | POA: Insufficient documentation

## 2022-03-25 DIAGNOSIS — Z170 Estrogen receptor positive status [ER+]: Secondary | ICD-10-CM | POA: Diagnosis not present

## 2022-03-25 DIAGNOSIS — Z9012 Acquired absence of left breast and nipple: Secondary | ICD-10-CM | POA: Diagnosis not present

## 2022-03-25 MED ORDER — DEXAMETHASONE 4 MG PO TABS: 4.0000 mg | ORAL_TABLET | Freq: Every day | ORAL | 0 refills | Status: DC

## 2022-03-25 MED ORDER — PROCHLORPERAZINE MALEATE 10 MG PO TABS: 10.0000 mg | ORAL_TABLET | Freq: Four times a day (QID) | ORAL | 1 refills | Status: DC | PRN

## 2022-03-25 MED ORDER — LIDOCAINE-PRILOCAINE 2.5-2.5 % EX CREA: TOPICAL_CREAM | CUTANEOUS | 3 refills | Status: DC

## 2022-03-25 MED ORDER — ONDANSETRON HCL 8 MG PO TABS: 8.0000 mg | ORAL_TABLET | Freq: Two times a day (BID) | ORAL | 1 refills | Status: DC | PRN

## 2022-03-25 NOTE — Telephone Encounter (Signed)
Received oncotype results of 30/23%. Spoke with and she is aware.  Confirmed appt for today 4/4 at 12N to discuss.  ?

## 2022-03-25 NOTE — Progress Notes (Signed)
START ON PATHWAY REGIMEN - Breast ? ? ?  Cycles 1 through 4: A cycle is every 14 days: ?    Doxorubicin  ?    Cyclophosphamide  ?    Pegfilgrastim-xxxx  ?  Cycles 5 through 16: A cycle is every 7 days: ?    Paclitaxel  ? ?**Always confirm dose/schedule in your pharmacy ordering system** ? ?Patient Characteristics: ?Postoperative without Neoadjuvant Therapy (Pathologic Staging), Invasive Disease, Adjuvant Therapy, HER2 Negative/Unknown/Equivocal, ER Positive, Node Positive, Node Positive (1-3), Oncotyping Ordered, Oncotype High Risk (? 26) ?Therapeutic Status: Postoperative without Neoadjuvant Therapy (Pathologic Staging) ?AJCC Grade: G3 ?AJCC N Category: pN1 ?AJCC M Category: cM0 ?ER Status: Positive (+) ?AJCC 8 Stage Grouping: IIA ?HER2 Status: Negative (-) ?Oncotype Dx Recurrence Score: 30 ?AJCC T Category: pT2 ?PR Status: Positive (+) ?Adjuvant Therapy Status: No Adjuvant Therapy Received Yet or Changing Initial Adjuvant Regimen due to Tolerance ?Has this patient completed genomic testing<= Yes - Oncotype DX(R) ?Intent of Therapy: ?Curative Intent, Discussed with Patient ?

## 2022-03-25 NOTE — Assessment & Plan Note (Signed)
03/05/2022:Left mastectomy: Grade 3 IDC 2.7 cm, focal high-grade DCIS, margins negative, 1/2 lymph nodes positive ER 99%, PR 99%, HER2 negative, Ki-67 50% ?03/24/2022:Oncotype DX recurrence score: 30 (distant recurrence at 9 years with hormone therapy alone: 23% ? ?Recommendation: ?1.  Adjuvant chemotherapy with dose dense Adriamycin and Cytoxan x4 followed by Taxol x12 ?2. adjuvant antiestrogen therapy ? ?Chemotherapy Counseling: I discussed the risks and benefits of chemotherapy including the risks of nausea/ vomiting, risk of infection from low WBC count, fatigue due to chemo or anemia, bruising or bleeding due to low platelets, mouth sores, loss/ change in taste and decreased appetite. Liver and kidney function will be monitored through out chemotherapy as abnormalities in liver and kidney function may be a side effect of treatment. Cardiac dysfunction due to Adriamycin and neuropathy risk from Taxol or discussed in detail. Risk of permanent bone marrow dysfunction and leukemia due to chemo were also discussed. ? ?Plan: Port, chemo class, echo, chemo consent ? ?Return to clinic to start chemotherapy ?

## 2022-03-25 NOTE — Progress Notes (Signed)
? ?Patient Care Team: ?Patient, No Pcp Per (Inactive) as PCP - General (General Practice) ?[name], RN as Oncology Nurse Navigator ?[name], RN as Oncology Nurse Navigator ?[name], MD as Consulting Physician (Hematology and Oncology) ? ?DIAGNOSIS:  ?Encounter Diagnosis  ?Name Primary?  ? Malignant neoplasm of upper-inner quadrant of left breast in female, estrogen receptor positive (Madison Becker) Yes  ? ? ?SUMMARY OF ONCOLOGIC HISTORY: ?Oncology History  ?Malignant neoplasm of upper-inner quadrant of left breast in female, estrogen receptor positive ([place])  ?10/22/2021 Initial Diagnosis  ? Palpable left breast mass. Diagnostic mammogram and [place]: an irregular mass within the upper inner left breast, IDC 2.2 cm ER 99%, PR 99%, HER2 negative, Ki-67 50%.  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  Total area of involvement 6 cm ?  ?10/22/2021 Cancer Staging  ? Staging form: Breast, AJCC 8th Edition ?- Clinical stage from 10/22/2021: Stage IIA (cT2, cN0(f), cM0, G3, ER+, PR+, HER2-) - Signed by [name], MD on 01/02/2022 ?Stage prefix: Initial diagnosis ?Method of lymph node assessment: Core biopsy ?Histologic grading system: 3 grade system ? ?  ?11/04/2021 Breast MRI  ? 2.5 cm irregular enhancing mass UIQ left breast IDC with DCIS.  Additional non-mass enhancement extends anterior and inferior to the mass 2.5 cm total span 7.1 cm. ?  ?03/05/2022 Surgery  ? Left mastectomy: Grade 3 IDC 2.7 cm, focal high-grade DCIS, margins negative, 1/2 lymph nodes positive ER 99%, PR 99%, HER2 negative, Ki-67 50% ?  ?03/24/2022 Oncotype testing  ? Oncotype DX recurrence score: 30 (distant recurrence at 9 years with hormone therapy alone: 23% ?  ? ? ?CHIEF COMPLIANT: Discuss oncotype report ? ?INTERVAL HISTORY: Madison Becker is a 66 y.o. She presents to the clinic today to discuss oncotype report.  She is healing and recovering very well from  the prior surgery of the left mastectomy. ? ? ?ALLERGIES:  has No Known Allergies. ? ?MEDICATIONS:  ?Current Outpatient Medications  ?Medication Sig Dispense Refill  ? dexamethasone (DECADRON) 4 MG tablet Take 1 tablet (4 mg total) by mouth daily. Take daily for 3 days after chemo. Take with food. 8 tablet 0  ? lidocaine-prilocaine (EMLA) cream Apply to affected area once 30 g 3  ? ondansetron (ZOFRAN) 8 MG tablet Take 1 tablet (8 mg total) by mouth 2 (two) times daily as needed. Start on the third day after chemotherapy. 30 tablet 1  ? prochlorperazine (COMPAZINE) 10 MG tablet Take 1 tablet (10 mg total) by mouth every 6 (six) hours as needed (Nausea or vomiting). 30 tablet 1  ? traMADol (ULTRAM) 50 MG tablet Take 1 tablet (50 mg total) by mouth every 6 (six) hours as needed for moderate pain or severe pain. 30 tablet 1  ? ?No current facility-administered medications for this visit.  ? ? ?PHYSICAL EXAMINATION: ?ECOG PERFORMANCE STATUS: 1 - Symptomatic but completely ambulatory ? ?Vitals:  ? 03/25/22 1206  ?BP: (!) 149/82  ?Pulse: 71  ?Resp: 18  ?Temp: 98.1 ?F (36.7 ?C)  ?SpO2: 100%  ? ?Filed Weights  ? 03/25/22 1206  ?Weight: 217 lb (98.4 kg)  ? ? ?ASSESSMENT & PLAN:  ?Malignant neoplasm of upper-inner quadrant of left breast in female, estrogen receptor positive ([place]) ?03/05/2022:Left mastectomy: Grade 3 IDC 2.7 cm, focal high-grade DCIS, margins negative, 1/2 lymph nodes positive ER 99%, PR 99%, HER2 negative, Ki-67 50% ?03/24/2022:Oncotype DX recurrence score: 30 (distant recurrence  at 9 years with hormone therapy alone: 23% ? ?Recommendation: ?1.  Adjuvant chemotherapy with dose dense Adriamycin and Cytoxan x4 followed by Taxol x12 ?2. adjuvant antiestrogen therapy ? ?Chemotherapy Counseling: I discussed the risks and benefits of chemotherapy including the risks of nausea/ vomiting, risk of infection from low WBC count, fatigue due to chemo or anemia, bruising or bleeding due to low platelets, mouth sores, loss/  change in taste and decreased appetite. Liver and kidney function will be monitored through out chemotherapy as abnormalities in liver and kidney function may be a side effect of treatment. Cardiac dysfunction due to Adriamycin and neuropathy risk from Taxol or discussed in detail. Risk of permanent bone marrow dysfunction and leukemia due to chemo were also discussed. ? ?Plan: Port, chemo class, echo, chemo consent ? ?Return to clinic to start chemotherapy ? ? ? ?Orders Placed This Encounter  ?Procedures  ? CT CHEST ABDOMEN PELVIS W CONTRAST  ?  Standing Status:   Future  ?  Standing Expiration Date:   03/26/2023  ?  Order Specific Question:   Preferred imaging location?  ?  Answer:   [facility]  ?  Order Specific Question:   Release to patient  ?  Answer:   Immediate  ?  Order Specific Question:   Is Oral Contrast requested for this exam?  ?  Answer:   Yes, Per Radiology protocol  ? NM Bone Scan Whole Body  ?  Standing Status:   Future  ?  Standing Expiration Date:   03/25/2023  ?  Order Specific Question:   If indicated for the ordered procedure, I authorize the administration of a radiopharmaceutical per Radiology protocol  ?  Answer:   Yes  ?  Order Specific Question:   Preferred imaging location?  ?  Answer:   [facility]  ?  Order Specific Question:   Release to patient  ?  Answer:   Immediate  ? CBC with Differential ([place] Only)  ?  Standing Status:   Standing  ?  Number of Occurrences:   20  ?  Standing Expiration Date:   03/26/2023  ? CMP ([place] only)  ?  Standing Status:   Standing  ?  Number of Occurrences:   20  ?  Standing Expiration Date:   03/26/2023  ? CBC with Differential  ?  Standing Status:   Standing  ?  Number of Occurrences:   20  ?  Standing Expiration Date:   03/26/2023  ? Comprehensive metabolic panel  ?  Standing Status:   Standing  ?  Number of Occurrences:   20  ?  Standing Expiration Date:   03/26/2023  ? PHYSICIAN COMMUNICATION ORDER  ?  A baseline Echo/ [place]  should be obtained prior to initiation of Anthracycline Chemotherapy  ? ECHOCARDIOGRAM COMPLETE  ?  Standing Status:   Future  ?  Standing Expiration Date:   03/26/2023  ?  Order Specific Question:   Where should this test be performed  ?  Answer:   [facility]  ?  Order Specific Question:   Perflutren DEFINITY (image enhancing agent) should be administered unless hypersensitivity or allergy exist  ?  Answer:   Administer Perflutren  ?  Order Specific Question:   Reason for exam-Echo  ?  Answer:   [id number]  [id number]  ?  Order Specific Question:   Other Comments  ?  Answer:   Pre Adriamycin assessment  ?  Order Specific Question:  Release to patient  ?  Answer:   Immediate  ? ?The patient has a good understanding of the overall plan. she agrees with it. she will call with any problems that may develop before the next visit here. ?Total time spent: 30 mins including face to face time and time spent for planning, charting and co-ordination of care ? ? [name], MD ?03/25/22 ? ? ? I Gardiner Coins am scribing for Dr. Lindi Adie ? ?I have reviewed the above documentation for accuracy and completeness, and I agree with the above. ?  ?

## 2022-03-25 NOTE — Research (Signed)
[id number] - TREATMENT OF REFRACTORY NAUSEA ? ?03/25/22 ? ?Patient Madison Becker was identified by Dr. Lindi Adie as a potential candidate for the above listed study.  This Clinical Research Coordinator met with Madison Becker, MRN [id number], on 03/25/22 in a manner and location that ensures patient privacy to discuss participation in the above listed research study.  Patient is Unaccompanied.  A copy of the informed consent document and separate HIPAA Authorization was provided to the patient.  Patient reads, speaks, and understands [place].   ? ?Patient was provided with the business card of this Coordinator and encouraged to contact the research team with any questions.  Approximately 15 minutes were spent with the patient reviewing the informed consent documents.  Patient was provided the option of taking informed consent documents home to review and was encouraged to review at their convenience with their support network, including other care providers. Patient took the consent documents home to review. ? ?Will follow up with the patient next week regarding interest in participating in this study. ? ?Clabe Seal ?Clinical Research Coordinator I  ?03/25/22 1:04 PM ?

## 2022-03-26 ENCOUNTER — Telehealth: Payer: Self-pay | Admitting: Hematology and Oncology

## 2022-03-26 ENCOUNTER — Encounter (HOSPITAL_COMMUNITY): Payer: Self-pay

## 2022-03-26 NOTE — Telephone Encounter (Signed)
Spoke to patient to give updated appointments, patient wanted different dates, arranged for appointments to be moved up, called patient back to inform her of changes, patient didn't answer, left message letting her know appointments will show up on mychart and gave her updated appointments as well, also let her know that scan appointments are hard to get rescheduled and to try and keep all appointments if possible ?

## 2022-03-28 ENCOUNTER — Encounter: Payer: Self-pay | Admitting: *Deleted

## 2022-03-31 ENCOUNTER — Telehealth: Payer: Self-pay | Admitting: Emergency Medicine

## 2022-03-31 NOTE — Telephone Encounter (Signed)
[id number] - TREATMENT OF REFRACTORY NAUSEA ? ?03/31/22 ? ?Called patient to follow up regarding interest in this study.  The patient states she has not had time to review the consent documents at this time.  She requests a call back in a couple days after she has had some more time to review the study.  Will plan to call back on Wednesday of this week. ? ?Clabe Seal ?Clinical Research Coordinator I  ?03/31/22  3:51 PM ? ?

## 2022-04-01 ENCOUNTER — Ambulatory Visit (HOSPITAL_COMMUNITY)
Admission: RE | Admit: 2022-04-01 | Discharge: 2022-04-01 | Disposition: A | Discharge status: Home / Self Care | Payer: BC Managed Care – PPO | Source: Ambulatory Visit | Attending: Hematology and Oncology | Admitting: Hematology and Oncology

## 2022-04-01 ENCOUNTER — Telehealth: Payer: Self-pay | Admitting: Hematology and Oncology

## 2022-04-01 DIAGNOSIS — C50212 Malignant neoplasm of upper-inner quadrant of left female breast: Secondary | ICD-10-CM | POA: Diagnosis present

## 2022-04-01 DIAGNOSIS — Z170 Estrogen receptor positive status [ER+]: Secondary | ICD-10-CM | POA: Insufficient documentation

## 2022-04-01 MED ORDER — IOHEXOL 300 MG/ML  SOLN
100.0000 mL | Freq: Once | INTRAMUSCULAR | Status: AC | PRN
  Administered 2022-04-01: 100 mL via INTRAVENOUS

## 2022-04-01 MED ORDER — SODIUM CHLORIDE (PF) 0.9 % IJ SOLN
INTRAMUSCULAR | Status: AC
  Filled 2022-04-01: qty 50

## 2022-04-01 NOTE — Telephone Encounter (Signed)
.  Called patient to schedule appointment per 4/7 inbasket, patient is aware of date and time.   ?

## 2022-04-02 ENCOUNTER — Telehealth: Payer: Self-pay | Admitting: Emergency Medicine

## 2022-04-02 ENCOUNTER — Encounter: Payer: Self-pay | Admitting: *Deleted

## 2022-04-02 NOTE — Telephone Encounter (Signed)
[id number] - TREATMENT OF REFRACTORY NAUSEA ? ?04/02/22 ? ?Called to follow up regarding this research study.  Patient did not answer, left voicemail requesting return call. ? ?Clabe Seal ?Clinical Research Coordinator I  ?04/02/22  2:32 PM ? ?

## 2022-04-03 NOTE — Progress Notes (Addendum)
Pharmacist Chemotherapy Monitoring - Initial Assessment   ? ?Anticipated start date: 04/10/22  ? ?The following has been reviewed per standard work regarding the patient's treatment regimen: ?The patient's diagnosis, treatment plan and drug doses, and organ/hematologic function ?Lab orders and baseline tests specific to treatment regimen  ?The treatment plan start date, drug sequencing, and pre-medications ?Prior authorization status  ?Patient's documented medication list, including drug-drug interaction screen and prescriptions for anti-emetics and supportive care specific to the treatment regimen ?The drug concentrations, fluid compatibility, administration routes, and timing of the medications to be used ?The patient's access for treatment and lifetime cumulative dose history, if applicable  ?The patient's medication allergies and previous infusion related reactions, if applicable  ? ?Changes made to treatment plan:  ?N/A ? ?Follow up needed:  ?1) Pending authorization for treatment  ?2) ECHO, scheduled for 04/04/22 ?3) F/u labs ? ? ?Raul Del [place], Page, BCPS, BCOP ?04/03/2022  3:40 PM ? ?

## 2022-04-03 NOTE — Progress Notes (Signed)
? ?Patient Care Team: ?Patient, No Pcp Per (Inactive) as PCP - General (General Practice) ?[name], RN as Oncology Nurse Navigator ?[name], RN as Oncology Nurse Navigator ?[name], MD as Consulting Physician (Hematology and Oncology) ? ?DIAGNOSIS:  ?Encounter Diagnosis  ?Name Primary?  ? Malignant neoplasm of upper-inner quadrant of left breast in female, estrogen receptor positive ([place])   ? ? ?SUMMARY OF ONCOLOGIC HISTORY: ?Oncology History  ?Malignant neoplasm of upper-inner quadrant of left breast in female, estrogen receptor positive ([place])  ?10/22/2021 Initial Diagnosis  ? Palpable left breast mass. Diagnostic mammogram and [place]: an irregular mass within the upper inner left breast, IDC 2.2 cm ER 99%, PR 99%, HER2 negative, Ki-67 50%.  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  Total area of involvement 6 cm ?  ?10/22/2021 Cancer Staging  ? Staging form: Breast, AJCC 8th Edition ?- Clinical stage from 10/22/2021: Stage IIA (cT2, cN0(f), cM0, G3, ER+, PR+, HER2-) - Signed by [name], MD on 01/02/2022 ?Stage prefix: Initial diagnosis ?Method of lymph node assessment: Core biopsy ?Histologic grading system: 3 grade system ? ?  ?11/04/2021 Breast MRI  ? 2.5 cm irregular enhancing mass UIQ left breast IDC with DCIS.  Additional non-mass enhancement extends anterior and inferior to the mass 2.5 cm total span 7.1 cm. ?  ?03/05/2022 Surgery  ? Left mastectomy: Grade 3 IDC 2.7 cm, focal high-grade DCIS, margins negative, 1/2 lymph nodes positive ER 99%, PR 99%, HER2 negative, Ki-67 50% ?  ?03/24/2022 Oncotype testing  ? Oncotype DX recurrence score: 30 (distant recurrence at 9 years with hormone therapy alone: 23% ?  ?04/10/2022 -  Chemotherapy  ? Patient is on Treatment Plan : BREAST ADJUVANT DOSE DENSE AC q14d / PACLitaxel q7d  ? ?  ?  ? ? ?CHIEF COMPLIANT: follow-up on Adriamycin and Cytoxan  ? ?INTERVAL HISTORY:  Madison Becker is a 66 y.o. She presents to the clinic today for follow-up and treatment. She tolerated treatment well. She states that nausea was very light. She state Monday her temperature was elevated it went up to 104. She state that she had a nagging headache that day. She also complained of discomfort in her abdomen. She states that she had a little bit of constipation but she has been moving bowels consistently. She also state that her energy level is good. ? ?ALLERGIES:  has No Known Allergies. ? ?MEDICATIONS:  ?Current Outpatient Medications  ?Medication Sig Dispense Refill  ? dexamethasone (DECADRON) 4 MG tablet Take 1 tablet (4 mg total) by mouth daily. Take daily for 3 days after chemo. Take with food. 8 tablet 0  ? lidocaine-prilocaine (EMLA) cream Apply to affected area once 30 g 3  ? ondansetron (ZOFRAN) 8 MG tablet Take 1 tablet (8 mg total) by mouth 2 (two) times daily as needed. Start on the third day after chemotherapy. 30 tablet 1  ? prochlorperazine (COMPAZINE) 10 MG tablet Take 1 tablet (10 mg total) by mouth every 6 (six) hours as needed (Nausea or vomiting). 30 tablet 1  ? traMADol (ULTRAM) 50 MG tablet Take 1 tablet (50 mg total) by mouth every 6 (six) hours as needed for moderate pain or severe pain. 30 tablet 1  ? ?No current facility-administered medications for this visit.  ? ? ?PHYSICAL EXAMINATION: ?ECOG PERFORMANCE STATUS: 1 - Symptomatic but completely ambulatory ? ?Vitals:  ? 04/17/22 0855  ?BP: 132/66  ?Pulse:  61  ?Resp: 18  ?Temp: (!) 97.5 ?F (36.4 ?C)  ?SpO2: 100%  ? ?Filed Weights  ? 04/17/22 0855  ?Weight: 216 lb 8 oz (98.2 kg)  ?  ? ?LABORATORY DATA:  ?I have reviewed the data as listed ? ?  Latest Ref Rng & Units 04/10/2022  ?  8:13 AM  ?CMP  ?Glucose 70 - 99 mg/dL 106    ?BUN 8 - 23 mg/dL 14    ?Creatinine 0.44 - 1.00 mg/dL 0.90    ?Sodium 135 - 145 mmol/L 139    ?Potassium 3.5 - 5.1 mmol/L 3.6    ?Chloride 98 - 111 mmol/L 108    ?CO2 22 - 32 mmol/L 26    ?Calcium 8.9 -  10.3 mg/dL 8.5    ?Total Protein 6.5 - 8.1 g/dL 6.6    ?Total Bilirubin 0.3 - 1.2 mg/dL 0.5    ?Alkaline Phos 38 - 126 U/L 63    ?AST 15 - 41 U/L 14    ?ALT 0 - 44 U/L 14    ? ? ?Lab Results  ?Component Value Date  ? WBC 1.8 (L) 04/17/2022  ? HGB 10.1 (L) 04/17/2022  ? HCT 29.8 (L) 04/17/2022  ? MCV 85.1 04/17/2022  ? PLT 72 (L) 04/17/2022  ? NEUTROABS PENDING 04/17/2022  ? ? ?ASSESSMENT & PLAN:  ?Malignant neoplasm of upper-inner quadrant of left breast in female, estrogen receptor positive ([place]) ?03/05/2022:Left mastectomy: Grade 3 IDC 2.7 cm, focal high-grade DCIS, margins negative, 1/2 lymph nodes positive ER 99%, PR 99%, HER2 negative, Ki-67 50% ?03/24/2022:Oncotype DX recurrence score: 30 (distant recurrence at 9 years with hormone therapy alone: 23% ?  ?Treatment plan: ?1.  Adjuvant chemotherapy with dose dense Adriamycin and Cytoxan x4 followed by Taxol x12 ?2. adjuvant antiestrogen therapy ?----------------------------------------------------------------------------------------------------------------------------------------- ?Current treatment: Cycle 1 day 8 dose dense Adriamycin and Cytoxan ?Chemotoxicities: ?Leukopenia: Awaiting the ANC from today: We might have to reduce the dosage of cycle 2. ?Anemia: Patient started at a baseline hemoglobin of 10.7 and today is 10.1. ?Thrombocytopenia: Monitoring ? ?Patient did not experience significant nausea vomiting or any other adverse effects. ?The low-grade temperature that she had after treatment and Neulasta is probably because of Neulasta. ? ?Return to clinic in 1 week for cycle 2 ? ? ? ?No orders of the defined types were placed in this encounter. ? ?The patient has a good understanding of the overall plan. she agrees with it. she will call with any problems that may develop before the next visit here. ?Total time spent: 30 mins including face to face time and time spent for planning, charting and co-ordination of care ? ? [name], MD ?04/17/22 ? ? ? I  Gardiner Coins am scribing for Dr. Lindi Adie ? ?I have reviewed the above documentation for accuracy and completeness, and I agree with the above. ?  ?

## 2022-04-04 ENCOUNTER — Ambulatory Visit (HOSPITAL_COMMUNITY)
Admission: RE | Admit: 2022-04-04 | Discharge: 2022-04-04 | Disposition: A | Discharge status: Home / Self Care | Payer: BC Managed Care – PPO | Source: Ambulatory Visit | Attending: Hematology and Oncology | Admitting: Hematology and Oncology

## 2022-04-04 ENCOUNTER — Other Ambulatory Visit: Payer: Self-pay

## 2022-04-04 ENCOUNTER — Inpatient Hospital Stay: Payer: BC Managed Care – PPO

## 2022-04-04 DIAGNOSIS — Z0189 Encounter for other specified special examinations: Secondary | ICD-10-CM | POA: Diagnosis not present

## 2022-04-04 DIAGNOSIS — Z170 Estrogen receptor positive status [ER+]: Secondary | ICD-10-CM | POA: Diagnosis not present

## 2022-04-04 DIAGNOSIS — C50212 Malignant neoplasm of upper-inner quadrant of left female breast: Secondary | ICD-10-CM | POA: Diagnosis not present

## 2022-04-04 DIAGNOSIS — Z01818 Encounter for other preprocedural examination: Secondary | ICD-10-CM | POA: Insufficient documentation

## 2022-04-04 DIAGNOSIS — K219 Gastro-esophageal reflux disease without esophagitis: Secondary | ICD-10-CM | POA: Insufficient documentation

## 2022-04-04 LAB — ECHOCARDIOGRAM COMPLETE
Area-P 1/2: 3.08 cm2
S' Lateral: 2.9 cm

## 2022-04-04 NOTE — Progress Notes (Signed)
2D Echocardiogram has been performed. ? Madison Becker ?04/04/2022, 8:59 AM ?

## 2022-04-07 ENCOUNTER — Other Ambulatory Visit: Payer: BC Managed Care – PPO

## 2022-04-07 ENCOUNTER — Encounter (HOSPITAL_COMMUNITY)
Admission: RE | Admit: 2022-04-07 | Discharge: 2022-04-07 | Disposition: A | Discharge status: Home / Self Care | Payer: BC Managed Care – PPO | Source: Ambulatory Visit | Attending: Hematology and Oncology | Admitting: Hematology and Oncology

## 2022-04-07 ENCOUNTER — Other Ambulatory Visit (HOSPITAL_COMMUNITY): Payer: BC Managed Care – PPO

## 2022-04-07 DIAGNOSIS — C50212 Malignant neoplasm of upper-inner quadrant of left female breast: Secondary | ICD-10-CM | POA: Insufficient documentation

## 2022-04-07 DIAGNOSIS — Z170 Estrogen receptor positive status [ER+]: Secondary | ICD-10-CM | POA: Diagnosis present

## 2022-04-07 MED ORDER — TECHNETIUM TC 99M MEDRONATE IV KIT
20.0000 | PACK | Freq: Once | INTRAVENOUS | Status: AC | PRN
  Administered 2022-04-07: 20 via INTRAVENOUS

## 2022-04-08 ENCOUNTER — Telehealth: Payer: Self-pay | Admitting: Emergency Medicine

## 2022-04-08 ENCOUNTER — Other Ambulatory Visit: Payer: Self-pay | Admitting: Radiology

## 2022-04-08 NOTE — Telephone Encounter (Signed)
[id number] - TREATMENT OF REFRACTORY NAUSEA ? ?04/08/22 ? ?Called again to follow up regarding this patients interest in this study.  The patient did not answer, left voicemail requesting return call. ? ?Clabe Seal ?Clinical Research Coordinator I  ?04/08/22  11:34 AM ? ?

## 2022-04-09 ENCOUNTER — Encounter: Payer: Self-pay | Admitting: *Deleted

## 2022-04-09 ENCOUNTER — Other Ambulatory Visit: Payer: Self-pay

## 2022-04-09 ENCOUNTER — Ambulatory Visit (HOSPITAL_COMMUNITY)
Admission: RE | Admit: 2022-04-09 | Discharge: 2022-04-09 | Disposition: A | Discharge status: Home / Self Care | Payer: BC Managed Care – PPO | Source: Ambulatory Visit | Attending: Hematology and Oncology | Admitting: Hematology and Oncology

## 2022-04-09 ENCOUNTER — Encounter (HOSPITAL_COMMUNITY): Payer: Self-pay

## 2022-04-09 ENCOUNTER — Other Ambulatory Visit: Payer: Self-pay | Admitting: Hematology and Oncology

## 2022-04-09 DIAGNOSIS — Z9012 Acquired absence of left breast and nipple: Secondary | ICD-10-CM | POA: Insufficient documentation

## 2022-04-09 DIAGNOSIS — K219 Gastro-esophageal reflux disease without esophagitis: Secondary | ICD-10-CM | POA: Diagnosis not present

## 2022-04-09 DIAGNOSIS — Z87891 Personal history of nicotine dependence: Secondary | ICD-10-CM | POA: Diagnosis not present

## 2022-04-09 DIAGNOSIS — C50912 Malignant neoplasm of unspecified site of left female breast: Secondary | ICD-10-CM | POA: Insufficient documentation

## 2022-04-09 DIAGNOSIS — Z170 Estrogen receptor positive status [ER+]: Secondary | ICD-10-CM

## 2022-04-09 DIAGNOSIS — Z95828 Presence of other vascular implants and grafts: Secondary | ICD-10-CM | POA: Insufficient documentation

## 2022-04-09 HISTORY — DX: Presence of other vascular implants and grafts: Z95.828

## 2022-04-09 HISTORY — PX: IR IMAGING GUIDED PORT INSERTION: IMG5740

## 2022-04-09 MED ORDER — HEPARIN SOD (PORK) LOCK FLUSH 100 UNIT/ML IV SOLN
INTRAVENOUS | Status: AC | PRN
Start: 2022-04-09 — End: 2022-04-09
  Administered 2022-04-09: 500 [IU] via INTRAVENOUS

## 2022-04-09 MED ORDER — LIDOCAINE-EPINEPHRINE 1 %-1:100000 IJ SOLN
INTRAMUSCULAR | Status: AC | PRN
  Administered 2022-04-09: 10 mL via INTRADERMAL

## 2022-04-09 MED ORDER — MIDAZOLAM HCL 2 MG/2ML IJ SOLN
INTRAMUSCULAR | Status: AC | PRN
  Administered 2022-04-09: 1 mg via INTRAVENOUS

## 2022-04-09 MED ORDER — LIDOCAINE HCL 1 % IJ SOLN
INTRAMUSCULAR | Status: AC
  Filled 2022-04-09: qty 20

## 2022-04-09 MED ORDER — MIDAZOLAM HCL 2 MG/2ML IJ SOLN
INTRAMUSCULAR | Status: AC
  Filled 2022-04-09: qty 2

## 2022-04-09 MED ORDER — FENTANYL CITRATE (PF) 100 MCG/2ML IJ SOLN
INTRAMUSCULAR | Status: AC | PRN
  Administered 2022-04-09: 50 ug via INTRAVENOUS

## 2022-04-09 MED ORDER — LIDOCAINE-EPINEPHRINE 1 %-1:100000 IJ SOLN
INTRAMUSCULAR | Status: AC | PRN
Start: 2022-04-09 — End: 2022-04-09
  Administered 2022-04-09: 10 mL via INTRADERMAL

## 2022-04-09 MED ORDER — HEPARIN SOD (PORK) LOCK FLUSH 100 UNIT/ML IV SOLN
INTRAVENOUS | Status: AC
  Filled 2022-04-09: qty 5

## 2022-04-09 MED ORDER — FENTANYL CITRATE (PF) 100 MCG/2ML IJ SOLN
INTRAMUSCULAR | Status: AC
  Filled 2022-04-09: qty 2

## 2022-04-09 MED ORDER — SODIUM CHLORIDE 0.9 % IV SOLN: INTRAVENOUS | Status: DC

## 2022-04-09 NOTE — Consult Note (Signed)
? ?Chief Complaint: ?Patient was seen in consultation today for Port-A-Cath placement ? ?Referring Physician(s): ?Gudena,Vinay ? ?Supervising Physician: Corrie Mckusick ? ?Patient Status: [place] ? ?History of Present Illness: ?Madison Becker is a 66 y.o. female with past medical history of GERD, cervical dysplasia and now with newly diagnosed left breast carcinoma.  She is status post left mastectomy on 03/05/2022.  She has poor venous access and presents today for Port-A-Cath placement to assist with treatment. ? ?Past Medical History:  ?Diagnosis Date  ? Breast cancer ([place]) 09/2021  ? GERD (gastroesophageal reflux disease)   ? History of cervical dysplasia   ? ? ?Past Surgical History:  ?Procedure Laterality Date  ? LEEP  2008  ? MASTECTOMY W/ SENTINEL NODE BIOPSY Left 03/05/2022  ? Procedure: LEFT MASTECTOMY WITH SENTINEL LYMPH NODE BIOPSY;  Surgeon: [name], MD;  Location: [facility];  Service: General;  Laterality: Left;  ? ? ?Allergies: ?Patient has no known allergies. ? ?Medications: ?Prior to Admission medications   ?Medication Sig Start Date End Date Taking? Authorizing Provider  ?dexamethasone (DECADRON) 4 MG tablet Take 1 tablet (4 mg total) by mouth daily. Take daily for 3 days after chemo. Take with food. 03/25/22   [name], MD  ?lidocaine-prilocaine (EMLA) cream Apply to affected area once 03/25/22   [name], MD  ?ondansetron (ZOFRAN) 8 MG tablet Take 1 tablet (8 mg total) by mouth 2 (two) times daily as needed. Start on the third day after chemotherapy. 03/25/22   [name], MD  ?prochlorperazine (COMPAZINE) 10 MG tablet Take 1 tablet (10 mg total) by mouth every 6 (six) hours as needed (Nausea or vomiting). 03/25/22   [name], MD  ?traMADol (ULTRAM) 50 MG tablet Take 1 tablet (50 mg total) by mouth every 6 (six) hours as needed for moderate pain or severe pain. 03/06/22   [name], MD  ?  ? ?Family History  ?Problem Relation Age of Onset  ?  Hypertension Mother   ? Deep vein thrombosis Mother   ? Coronary artery disease Father   ? Breast cancer Maternal Aunt   ? ? ?Social History  ? ?Socioeconomic History  ? Marital status: Single  ?  Spouse name: Not on file  ? Number of children: Not on file  ? Years of education: Not on file  ? Highest education level: Not on file  ?Occupational History  ? Not on file  ?Tobacco Use  ? Smoking status: Former  ?  Types: Cigarettes  ? Smokeless tobacco: Never  ?Vaping Use  ? Vaping Use: Never used  ?Substance and Sexual Activity  ? Alcohol use: Yes  ?  Comment: occ  ? Drug use: Never  ? Sexual activity: Not on file  ?Other Topics Concern  ? Not on file  ?Social History Narrative  ? Not on file  ? ?Social Determinants of Health  ? ?Financial Resource Strain: Not on file  ?Food Insecurity: Not on file  ?Transportation Needs: Not on file  ?Physical Activity: Not on file  ?Stress: Not on file  ?Social Connections: Not on file  ? ? ? ?Review of Systems currently denies fever, headache, chest pain, dyspnea, cough, abdominal/back pain, nausea, vomiting or bleeding ? ?Vital Signs:pending ? ? ? ?Physical Exam awake, alert.  Chest clear to auscultation bilaterally.  Heart with regular rate /rhythm.   Abd soft, positive bowel sounds, nontender.  Extremities with full range of motion ? ?Imaging: ?NM Bone Scan Whole Body ? ?Result Date:  04/09/2022 ?CLINICAL DATA:  History of left breast cancer. EXAM: NUCLEAR MEDICINE WHOLE BODY BONE SCAN TECHNIQUE: Whole body anterior and posterior images were obtained approximately 3 hours after intravenous injection of radiopharmaceutical. RADIOPHARMACEUTICALS:  21.9 mCi Technetium-32mMDP IV COMPARISON:  CT 04/01/2022. FINDINGS: Bilateral renal function excretion. Increased activity noted over the left breast, possibly related to the patient's known left breast cancer. Thoracic spine scoliosis noted. Mild increased activity noted over both shoulders and knees, most likely degenerative. Increased  activity noted over both ankles and feet, particularly on the right. Findings most likely degenerative. X-rays of the ankles and feet can be obtained for further evaluation as needed. No other bony abnormalities identified. IMPRESSION: 1. Increased activity noted over the left breast, possibly related to the patient's known left breast cancer. 2. Mild increased activity noted over both shoulders and knees, most likely degenerative. Increased activity noted over both ankles feet, particularly on the right. Findings most likely degenerative. X-rays of the ankles and feet can be obtained for further evaluation as needed. 3. No other bony abnormalities noted to suggest bony metastatic disease. Electronically Signed   By: TMarcello Moores Register M.D.   On: 04/09/2022 07:36  ? ?CT CHEST ABDOMEN PELVIS W CONTRAST ? ?Result Date: 04/02/2022 ?CLINICAL DATA:  Initial workup of invasive breast cancer. Abdominal and back pain. * Tracking Code: BO * EXAM: CT CHEST, ABDOMEN, AND PELVIS WITH CONTRAST TECHNIQUE: Multidetector CT imaging of the chest, abdomen and pelvis was performed following the standard protocol during bolus administration of intravenous contrast. RADIATION DOSE REDUCTION: This exam was performed according to the departmental dose-optimization program which includes automated exposure control, adjustment of the mA and/or kV according to patient size and/or use of iterative reconstruction technique. CONTRAST:  1075mOMNIPAQUE IOHEXOL 300 MG/ML  SOLN COMPARISON:  Limited correlation made with breast MRI 11/02/2021 FINDINGS: CT CHEST FINDINGS Cardiovascular: No significant vascular findings. The heart size is normal. There is no pericardial effusion. Mediastinum/Nodes: Interval left mastectomy and left axillary sentinel lymph node biopsy. There is a postsurgical fluid collection laterally in the left breast measuring 4.4 x 2.6 cm on image 34/2. In the left axilla, there is a fluid density collection measuring 2.4 cm on  image 17/2. There are adjacent left axillary surgical clips and small lymph nodes measuring up to 7 mm in diameter. No enlarged mediastinal, hilar or internal mammary lymph nodes. The thyroid gland, trachea and esophagus demonstrate no significant findings. Lungs/Pleura: No pleural effusion or pneumothorax. There are several small nonspecific pulmonary nodules bilaterally, largest measuring 5 mm in the right lower lobe (image 64/6) and 6 mm in the right lower lobe (image 76/6). Musculoskeletal/Chest wall: No chest wall mass or suspicious osseous findings. Convex right thoracic scoliosis. Postsurgical fluid collections in the left breast and axilla as described above. CT ABDOMEN AND PELVIS FINDINGS Hepatobiliary: The liver is normal in density without suspicious focal abnormality. No evidence of gallstones, gallbladder wall thickening or biliary dilatation. Pancreas: Unremarkable. No pancreatic ductal dilatation or surrounding inflammatory changes. Spleen: Normal in size without focal abnormality. Adrenals/Urinary Tract: Both adrenal glands appear normal. The kidneys appear normal without evidence of urinary tract calculus, suspicious lesion or hydronephrosis. No bladder abnormalities are seen. Stomach/Bowel: Enteric contrast was administered and has passed into the distal colon. The stomach appears unremarkable for its degree of distension. No evidence of bowel wall thickening, distention or surrounding inflammatory change. The appendix appears normal. Moderate stool in the rectum. Vascular/Lymphatic: There are no enlarged abdominal or pelvic lymph nodes. No significant  vascular findings. Reproductive: The uterus and ovaries appear unremarkable. No adnexal mass. Other: Small umbilical hernia containing only fat. No ascites or peritoneal nodularity. Musculoskeletal: No acute or significant osseous findings. Convex left lumbar scoliosis. IMPRESSION: 1. Postsurgical fluid collections in the left breast and left axilla  status post mastectomy and axillary node dissection. No enlarged axillary or other thoracic lymph nodes. 2. Nonspecific small pulmonary nodules bilaterally which could reflect small metastases. Fleischner criter

## 2022-04-09 NOTE — Discharge Instructions (Signed)

## 2022-04-09 NOTE — Procedures (Signed)
Interventional Radiology Procedure Note  Procedure: Placement of a right IJ approach single lumen PowerPort.  Tip is positioned at the superior cavoatrial junction and catheter is ready for immediate use.  Complications: None Recommendations:  - Ok to shower tomorrow - Do not submerge for 7 days - Routine line care   Signed,  Niobe Dick S. Shailene Demonbreun, DO   

## 2022-04-10 ENCOUNTER — Encounter: Payer: Self-pay | Admitting: Emergency Medicine

## 2022-04-10 ENCOUNTER — Inpatient Hospital Stay: Payer: BC Managed Care – PPO

## 2022-04-10 ENCOUNTER — Encounter: Payer: Self-pay | Admitting: *Deleted

## 2022-04-10 VITALS — BP 147/87 | HR 66 | Temp 98.4°F | Resp 20

## 2022-04-10 DIAGNOSIS — C50212 Malignant neoplasm of upper-inner quadrant of left female breast: Secondary | ICD-10-CM

## 2022-04-10 DIAGNOSIS — Z95828 Presence of other vascular implants and grafts: Secondary | ICD-10-CM

## 2022-04-10 DIAGNOSIS — Z5111 Encounter for antineoplastic chemotherapy: Secondary | ICD-10-CM | POA: Diagnosis not present

## 2022-04-10 LAB — CBC WITH DIFFERENTIAL (CANCER CENTER ONLY)
Abs Immature Granulocytes: 0.01 10*3/uL (ref 0.00–0.07)
Basophils Absolute: 0 10*3/uL (ref 0.0–0.1)
Basophils Relative: 1 %
Eosinophils Absolute: 0.1 10*3/uL (ref 0.0–0.5)
Eosinophils Relative: 3 %
HCT: 32.6 % — ABNORMAL LOW (ref 36.0–46.0)
Hemoglobin: 10.7 g/dL — ABNORMAL LOW (ref 12.0–15.0)
Immature Granulocytes: 0 %
Lymphocytes Relative: 29 %
Lymphs Abs: 1.2 10*3/uL (ref 0.7–4.0)
MCH: 28.3 pg (ref 26.0–34.0)
MCHC: 32.8 g/dL (ref 30.0–36.0)
MCV: 86.2 fL (ref 80.0–100.0)
Monocytes Absolute: 0.5 10*3/uL (ref 0.1–1.0)
Monocytes Relative: 12 %
Neutro Abs: 2.2 10*3/uL (ref 1.7–7.7)
Neutrophils Relative %: 55 %
Platelet Count: 144 10*3/uL — ABNORMAL LOW (ref 150–400)
RBC: 3.78 MIL/uL — ABNORMAL LOW (ref 3.87–5.11)
RDW: 13.5 % (ref 11.5–15.5)
Smear Review: NORMAL
WBC Count: 4 10*3/uL (ref 4.0–10.5)
nRBC: 0 % (ref 0.0–0.2)

## 2022-04-10 LAB — CMP (CANCER CENTER ONLY)
ALT: 14 U/L (ref 0–44)
AST: 14 U/L — ABNORMAL LOW (ref 15–41)
Albumin: 3.6 g/dL (ref 3.5–5.0)
Alkaline Phosphatase: 63 U/L (ref 38–126)
Anion gap: 5 (ref 5–15)
BUN: 14 mg/dL (ref 8–23)
CO2: 26 mmol/L (ref 22–32)
Calcium: 8.5 mg/dL — ABNORMAL LOW (ref 8.9–10.3)
Chloride: 108 mmol/L (ref 98–111)
Creatinine: 0.9 mg/dL (ref 0.44–1.00)
GFR, Estimated: >60 mL/min (ref >60)
Glucose, Bld: 106 mg/dL — ABNORMAL HIGH (ref 70–99)
Potassium: 3.6 mmol/L (ref 3.5–5.1)
Sodium: 139 mmol/L (ref 135–145)
Total Bilirubin: 0.5 mg/dL (ref 0.3–1.2)
Total Protein: 6.6 g/dL (ref 6.5–8.1)

## 2022-04-10 MED ORDER — DOXORUBICIN HCL CHEMO IV INJECTION 2 MG/ML
60.0000 mg/m2 | Freq: Once | INTRAVENOUS | Status: AC
  Administered 2022-04-10: 126 mg via INTRAVENOUS
  Filled 2022-04-10: qty 63

## 2022-04-10 MED ORDER — SODIUM CHLORIDE 0.9 % IV SOLN: Freq: Once | INTRAVENOUS | Status: AC

## 2022-04-10 MED ORDER — HEPARIN SOD (PORK) LOCK FLUSH 100 UNIT/ML IV SOLN
500.0000 [IU] | Freq: Once | INTRAVENOUS | Status: AC | PRN
  Administered 2022-04-10: 500 [IU]

## 2022-04-10 MED ORDER — SODIUM CHLORIDE 0.9% FLUSH
10.0000 mL | INTRAVENOUS | Status: DC | PRN
  Administered 2022-04-10: 10 mL

## 2022-04-10 MED ORDER — SODIUM CHLORIDE 0.9 % IV SOLN
600.0000 mg/m2 | Freq: Once | INTRAVENOUS | Status: AC
  Administered 2022-04-10: 1260 mg via INTRAVENOUS
  Filled 2022-04-10: qty 63

## 2022-04-10 MED ORDER — PALONOSETRON HCL INJECTION 0.25 MG/5ML
0.2500 mg | Freq: Once | INTRAVENOUS | Status: AC
  Administered 2022-04-10: 0.25 mg via INTRAVENOUS
  Filled 2022-04-10: qty 5

## 2022-04-10 MED ORDER — SODIUM CHLORIDE 0.9 % IV SOLN
10.0000 mg | Freq: Once | INTRAVENOUS | Status: AC
  Administered 2022-04-10: 10 mg via INTRAVENOUS
  Filled 2022-04-10: qty 10

## 2022-04-10 MED ORDER — SODIUM CHLORIDE 0.9 % IV SOLN
150.0000 mg | Freq: Once | INTRAVENOUS | Status: AC
  Administered 2022-04-10: 150 mg via INTRAVENOUS
  Filled 2022-04-10: qty 150

## 2022-04-10 MED ORDER — SODIUM CHLORIDE 0.9% FLUSH
10.0000 mL | Freq: Once | INTRAVENOUS | Status: AC
  Administered 2022-04-10: 10 mL

## 2022-04-10 NOTE — Progress Notes (Signed)
? ?Patient Care Team: ?Patient, No Pcp Per (Inactive) as PCP - General (General Practice) ?[name], RN as Oncology Nurse Navigator ?[name], RN as Oncology Nurse Navigator ?[name], MD as Consulting Physician (Hematology and Oncology) ? ?DIAGNOSIS:  ?Encounter Diagnosis  ?Name Primary?  ? Malignant neoplasm of upper-inner quadrant of left breast in female, estrogen receptor positive ([place])   ? ? ?SUMMARY OF ONCOLOGIC HISTORY: ?Oncology History  ?Malignant neoplasm of upper-inner quadrant of left breast in female, estrogen receptor positive ([place])  ?10/22/2021 Initial Diagnosis  ? Palpable left breast mass. Diagnostic mammogram and [place]: an irregular mass within the upper inner left breast, IDC 2.2 cm ER 99%, PR 99%, HER2 negative, Ki-67 50%.  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  Total area of involvement 6 cm ?  ?10/22/2021 Cancer Staging  ? Staging form: Breast, AJCC 8th Edition ?- Clinical stage from 10/22/2021: Stage IIA (cT2, cN0(f), cM0, G3, ER+, PR+, HER2-) - Signed by [name], MD on 01/02/2022 ?Stage prefix: Initial diagnosis ?Method of lymph node assessment: Core biopsy ?Histologic grading system: 3 grade system ? ?  ?11/04/2021 Breast MRI  ? 2.5 cm irregular enhancing mass UIQ left breast IDC with DCIS.  Additional non-mass enhancement extends anterior and inferior to the mass 2.5 cm total span 7.1 cm. ?  ?03/05/2022 Surgery  ? Left mastectomy: Grade 3 IDC 2.7 cm, focal high-grade DCIS, margins negative, 1/2 lymph nodes positive ER 99%, PR 99%, HER2 negative, Ki-67 50% ?  ?03/24/2022 Oncotype testing  ? Oncotype DX recurrence score: 30 (distant recurrence at 9 years with hormone therapy alone: 23% ?  ?04/10/2022 -  Chemotherapy  ? Patient is on Treatment Plan : BREAST ADJUVANT DOSE DENSE AC q14d / PACLitaxel q7d  ? ?  ?  ? ? ?CHIEF COMPLIANT: Left breast cancer on Adriamycin and Cytoxan  ? ?INTERVAL  HISTORY: Geonna Lockyer is a  ?66 y.o. with the above mention  Left breast cancer. She presents to the clinic today for treatment and follow-up. ? ? ?ALLERGIES:  has No Known Allergies. ? ?MEDICATIONS:  ?Current Outpatient Medications  ?Medication Sig Dispense Refill  ? dexamethasone (DECADRON) 4 MG tablet Take 1 tablet (4 mg total) by mouth daily. Take daily for 3 days after chemo. Take with food. 8 tablet 0  ? lidocaine-prilocaine (EMLA) cream Apply to affected area once 30 g 3  ? ondansetron (ZOFRAN) 8 MG tablet Take 1 tablet (8 mg total) by mouth 2 (two) times daily as needed. Start on the third day after chemotherapy. 30 tablet 1  ? prochlorperazine (COMPAZINE) 10 MG tablet Take 1 tablet (10 mg total) by mouth every 6 (six) hours as needed (Nausea or vomiting). 30 tablet 1  ? ?No current facility-administered medications for this visit.  ? ? ?PHYSICAL EXAMINATION: ?ECOG PERFORMANCE STATUS: 1 - Symptomatic but completely ambulatory ? ?Vitals:  ? 04/24/22 0913  ?BP: (!) 146/67  ?Pulse: 97  ?Resp: 18  ?Temp: (!) 97.3 ?F (36.3 ?C)  ?SpO2: 99%  ? ?Filed Weights  ? 04/24/22 0913  ?Weight: 212 lb 4.8 oz (96.3 kg)  ? ?  ? ?LABORATORY DATA:  ?I have reviewed the data as listed ? ?  Latest Ref Rng & Units 04/17/2022  ?  8:46 AM 04/10/2022  ?  8:13 AM  ?CMP  ?Glucose 70 - 99 mg/dL 140   106    ?BUN 8 - 23  mg/dL 18   14    ?Creatinine 0.44 - 1.00 mg/dL 0.93   0.90    ?Sodium 135 - 145 mmol/L 139   139    ?Potassium 3.5 - 5.1 mmol/L 3.7   3.6    ?Chloride 98 - 111 mmol/L 108   108    ?CO2 22 - 32 mmol/L 27   26    ?Calcium 8.9 - 10.3 mg/dL 8.6   8.5    ?Total Protein 6.5 - 8.1 g/dL 6.3   6.6    ?Total Bilirubin 0.3 - 1.2 mg/dL 0.5   0.5    ?Alkaline Phos 38 - 126 U/L 67   63    ?AST 15 - 41 U/L 17   14    ?ALT 0 - 44 U/L 36   14    ? ? ?Lab Results  ?Component Value Date  ? WBC 5.1 04/24/2022  ? HGB 9.8 (L) 04/24/2022  ? HCT 29.6 (L) 04/24/2022  ? MCV 86.0 04/24/2022  ? PLT 146 (L) 04/24/2022  ? NEUTROABS 3.6 04/24/2022   ? ? ?ASSESSMENT & PLAN:  ?Malignant neoplasm of upper-inner quadrant of left breast in female, estrogen receptor positive ([place]) ?03/05/2022:Left mastectomy: Grade 3 IDC 2.7 cm, focal high-grade DCIS, margins negative, 1/2 lymph nodes positive ER 99%, PR 99%, HER2 negative, Ki-67 50% ?03/24/2022:Oncotype DX recurrence score: 30 (distant recurrence at 9 years with hormone therapy alone: 23% ?  ?Treatment plan: ?1.  Adjuvant chemotherapy with dose dense Adriamycin and Cytoxan x4 followed by Taxol x12 ?2. adjuvant antiestrogen therapy ?----------------------------------------------------------------------------------------------------------------------------------------- ?Current treatment: Cycle 2 dose dense Adriamycin and Cytoxan ?Chemotoxicities: ?Hair loss. ?Anemia: Patient started at a baseline hemoglobin of 10.7 and today is 9.8. ?Thrombocytopenia: Monitoring today it is 146 ?  ?Patient did not experience significant nausea vomiting or any other adverse effects. ?  ?Return to clinic in 2 weeks for cycle 3 ? ? ? ?No orders of the defined types were placed in this encounter. ? ?The patient has a good understanding of the overall plan. she agrees with it. she will call with any problems that may develop before the next visit here. ?Total time spent: 30 mins including face to face time and time spent for planning, charting and co-ordination of care ? ? [name], MD ?04/24/22 ? ? ? I Gardiner Coins am scribing for Dr. Lindi Adie ? ?I have reviewed the above documentation for accuracy and completeness, and I agree with the above. ?  ?

## 2022-04-10 NOTE — Progress Notes (Signed)
Blood return noted before, during, and after adriamycin infusion  ?

## 2022-04-10 NOTE — Research (Signed)
DCP-001: Use of a Clinical Trial Screening Tool to Address Cancer Health Disparities in the [facility]) ? ?04/10/22 ? ?Patient Madison Becker was identified by as a potential candidate for the above listed study because she was screened for [place].  This Clinical Research Coordinator met with Deone Omahoney, [id number], on 04/10/22 in a manner and location that ensures patient privacy to discuss participation in the above listed research study.  Patient is Unaccompanied.  A copy of the informed consent document and separate HIPAA Authorization was provided to the patient.  Patient reads, speaks, and understands [place].   ? ?Patient was provided with the business card of this Coordinator and encouraged to contact the research team with any questions.  Patient was provided the option of taking informed consent documents home to review and was encouraged to review at their convenience with their support network, including other care providers. Patient is comfortable with making a decision regarding study participation today. ? ?As outlined in the informed consent form, this Coordinator and Annaly Skop discussed the purpose of the research study, the investigational nature of the study, study procedures and requirements for study participation, potential risks and benefits of study participation, as well as alternatives to participation. This study is not blinded. The patient understands participation is voluntary and they may withdraw from study participation at any time.  This study does not involve randomization.  This study does not involve an investigational drug or device. This study does not involve a placebo. Patient understands enrollment is pending full eligibility review.  ? ?Confidentiality and how the patient's information will be used as part of study participation were discussed.  Patient was informed there is not reimbursement provided for their time and  effort spent on trial participation.  The patient is encouraged to discuss research study participation with their insurance provider to determine what costs they may incur as part of study participation, including research related injury.   ? ?All questions were answered to patient's satisfaction.  The informed consent and separate HIPAA Authorization was reviewed page by page.  The patient's mental and emotional status is appropriate to provide informed consent, and the patient verbalizes an understanding of study participation.  Patient has agreed to participate in the above listed research study and has voluntarily signed the informed consent version date 10/01/2021, [place] active date 01/16/2022 and separate HIPAA Authorization, version 5, revised 12/07/2019  on 04/10/22 at 10:31AM.  The patient was provided with a copy of the signed informed consent form and separate HIPAA Authorization for their reference.  No study specific procedures were obtained prior to the signing of the informed consent document.  Approximately 10 minutes were spent with the patient reviewing the informed consent documents.  Patient was not requested to complete a Release of Information form. ? ?Data collection was performed using DCP-001 data collection worksheet.  Patient denied questions at this time.  She was thanked for her time and participation in this study. ? ?Clabe Seal ?Clinical Research Coordinator I  ?04/10/22 10:37 AM ?

## 2022-04-10 NOTE — Patient Instructions (Signed)
[facility] ?Discharge Instructions for Patients Receiving Chemotherapy ? ?Today you received the following chemotherapy agents adriamycin/cytoxan  ? ?To help prevent nausea and vomiting after your treatment, we encourage you to take your nausea medication as directed.  NO ZOFRAN FOR 3 DAYS AFTER TREATMENT  ?  ?If you develop nausea and vomiting that is not controlled by your nausea medication, call the clinic.  ? ?BELOW ARE SYMPTOMS THAT SHOULD BE REPORTED IMMEDIATELY: ?*FEVER GREATER THAN 100.5 F ?*CHILLS WITH OR WITHOUT FEVER ?NAUSEA AND VOMITING THAT IS NOT CONTROLLED WITH YOUR NAUSEA MEDICATION ?*UNUSUAL SHORTNESS OF BREATH ?*UNUSUAL BRUISING OR BLEEDING ?TENDERNESS IN MOUTH AND THROAT WITH OR WITHOUT PRESENCE OF ULCERS ?*URINARY PROBLEMS ?*BOWEL PROBLEMS ?UNUSUAL RASH ?Items with * indicate a potential emergency and should be followed up as soon as possible. ? ?Feel free to call the clinic you have any questions or concerns. The clinic phone number is [phone]. ? ?

## 2022-04-11 ENCOUNTER — Telehealth: Payer: Self-pay

## 2022-04-11 NOTE — Telephone Encounter (Signed)
-----   Message from Regan Rakers, RN sent at 04/10/2022 11:29 AM EDT ----- ?Regarding: first time chemo/gudena ?First time a/c.  Tolerated well.  Dr Lindi Adie  ? ?

## 2022-04-11 NOTE — Telephone Encounter (Signed)
LM for patient that this nurse was calling to see how they were doing after their treatment. Please call back to Dr. Gudena's nurse at [phone] if they have any questions or concerns regarding the treatment. 

## 2022-04-12 ENCOUNTER — Inpatient Hospital Stay: Payer: BC Managed Care – PPO

## 2022-04-12 VITALS — BP 148/70 | HR 50 | Temp 97.5°F | Resp 16

## 2022-04-12 DIAGNOSIS — Z5111 Encounter for antineoplastic chemotherapy: Secondary | ICD-10-CM | POA: Diagnosis not present

## 2022-04-12 DIAGNOSIS — C50212 Malignant neoplasm of upper-inner quadrant of left female breast: Secondary | ICD-10-CM

## 2022-04-12 MED ORDER — PEGFILGRASTIM-CBQV 6 MG/0.6ML ~~LOC~~ SOSY
6.0000 mg | PREFILLED_SYRINGE | Freq: Once | SUBCUTANEOUS | Status: AC
  Administered 2022-04-12: 6 mg via SUBCUTANEOUS
  Filled 2022-04-12: qty 0.6

## 2022-04-16 NOTE — Assessment & Plan Note (Signed)
03/05/2022:Left mastectomy: Grade 3 IDC 2.7 cm, focal high-grade DCIS, margins negative, 1/2 lymph nodes positive ER 99%, PR 99%, HER2 negative, Ki-67 50% ?03/24/2022:Oncotype DX recurrence score: 30 (distant recurrence at 9 years with hormone therapy alone: 23% ?? ?Treatment plan: ?1.  Adjuvant chemotherapy with dose dense Adriamycin and Cytoxan x4 followed by Taxol x12 ?2. adjuvant antiestrogen therapy ?----------------------------------------------------------------------------------------------------------------------------------------- ?Current treatment: Cycle 1 day 8 dose dense Adriamycin and Cytoxan ?Chemotoxicities: ? ?Return to clinic in 1 week for cycle 2 ?

## 2022-04-17 ENCOUNTER — Inpatient Hospital Stay (HOSPITAL_BASED_OUTPATIENT_CLINIC_OR_DEPARTMENT_OTHER): Payer: BC Managed Care – PPO | Admitting: Hematology and Oncology

## 2022-04-17 ENCOUNTER — Other Ambulatory Visit: Payer: Self-pay

## 2022-04-17 ENCOUNTER — Inpatient Hospital Stay: Payer: BC Managed Care – PPO

## 2022-04-17 DIAGNOSIS — C50212 Malignant neoplasm of upper-inner quadrant of left female breast: Secondary | ICD-10-CM | POA: Diagnosis not present

## 2022-04-17 DIAGNOSIS — Z95828 Presence of other vascular implants and grafts: Secondary | ICD-10-CM

## 2022-04-17 DIAGNOSIS — Z5111 Encounter for antineoplastic chemotherapy: Secondary | ICD-10-CM | POA: Diagnosis not present

## 2022-04-17 DIAGNOSIS — Z170 Estrogen receptor positive status [ER+]: Secondary | ICD-10-CM

## 2022-04-17 LAB — CBC WITH DIFFERENTIAL/PLATELET
Abs Immature Granulocytes: 0.03 10*3/uL (ref 0.00–0.07)
Basophils Absolute: 0 10*3/uL (ref 0.0–0.1)
Basophils Relative: 1 %
Eosinophils Absolute: 0 10*3/uL (ref 0.0–0.5)
Eosinophils Relative: 2 %
HCT: 29.8 % — ABNORMAL LOW (ref 36.0–46.0)
Hemoglobin: 10.1 g/dL — ABNORMAL LOW (ref 12.0–15.0)
Immature Granulocytes: 2 %
Lymphocytes Relative: 38 %
Lymphs Abs: 0.7 10*3/uL (ref 0.7–4.0)
MCH: 28.9 pg (ref 26.0–34.0)
MCHC: 33.9 g/dL (ref 30.0–36.0)
MCV: 85.1 fL (ref 80.0–100.0)
Monocytes Absolute: 0.1 10*3/uL (ref 0.1–1.0)
Monocytes Relative: 3 %
Neutro Abs: 1 10*3/uL — ABNORMAL LOW (ref 1.7–7.7)
Neutrophils Relative %: 54 %
Platelets: 72 10*3/uL — ABNORMAL LOW (ref 150–400)
RBC: 3.5 MIL/uL — ABNORMAL LOW (ref 3.87–5.11)
RDW: 13 % (ref 11.5–15.5)
Smear Review: NORMAL
WBC: 1.8 10*3/uL — ABNORMAL LOW (ref 4.0–10.5)
nRBC: 0 % (ref 0.0–0.2)

## 2022-04-17 LAB — COMPREHENSIVE METABOLIC PANEL
ALT: 36 U/L (ref 0–44)
AST: 17 U/L (ref 15–41)
Albumin: 3.4 g/dL — ABNORMAL LOW (ref 3.5–5.0)
Alkaline Phosphatase: 67 U/L (ref 38–126)
Anion gap: 4 — ABNORMAL LOW (ref 5–15)
BUN: 18 mg/dL (ref 8–23)
CO2: 27 mmol/L (ref 22–32)
Calcium: 8.6 mg/dL — ABNORMAL LOW (ref 8.9–10.3)
Chloride: 108 mmol/L (ref 98–111)
Creatinine, Ser: 0.93 mg/dL (ref 0.44–1.00)
GFR, Estimated: >60 mL/min (ref >60)
Glucose, Bld: 140 mg/dL — ABNORMAL HIGH (ref 70–99)
Potassium: 3.7 mmol/L (ref 3.5–5.1)
Sodium: 139 mmol/L (ref 135–145)
Total Bilirubin: 0.5 mg/dL (ref 0.3–1.2)
Total Protein: 6.3 g/dL — ABNORMAL LOW (ref 6.5–8.1)

## 2022-04-17 MED ORDER — HEPARIN SOD (PORK) LOCK FLUSH 100 UNIT/ML IV SOLN
500.0000 [IU] | Freq: Once | INTRAVENOUS | Status: AC
  Administered 2022-04-17: 500 [IU]

## 2022-04-17 MED ORDER — SODIUM CHLORIDE 0.9% FLUSH
10.0000 mL | Freq: Once | INTRAVENOUS | Status: AC
  Administered 2022-04-17: 10 mL

## 2022-04-18 ENCOUNTER — Telehealth: Payer: Self-pay | Admitting: *Deleted

## 2022-04-18 NOTE — Telephone Encounter (Signed)
Received call from pt requesting letter from MD requesting pt to be out of work until the completion of treatment.  RN reviewed with pt if she has signed up for FMLA through her employer.  Pt states original FMLA was completed by her surgeon Dr. Ninfa Linden.  RN educated pt that FMLA paperwork will need to be sent to our office to be completed.  RN provided pt with [facility] department fax number as well as office numbers.  Pt states she will contact HR and have appropriate forms faxed.  ?

## 2022-04-24 ENCOUNTER — Inpatient Hospital Stay: Payer: BC Managed Care – PPO | Attending: Hematology and Oncology | Admitting: Hematology and Oncology

## 2022-04-24 ENCOUNTER — Inpatient Hospital Stay: Payer: BC Managed Care – PPO

## 2022-04-24 ENCOUNTER — Other Ambulatory Visit: Payer: Self-pay

## 2022-04-24 DIAGNOSIS — Z5111 Encounter for antineoplastic chemotherapy: Secondary | ICD-10-CM | POA: Insufficient documentation

## 2022-04-24 DIAGNOSIS — Z170 Estrogen receptor positive status [ER+]: Secondary | ICD-10-CM

## 2022-04-24 DIAGNOSIS — Z5189 Encounter for other specified aftercare: Secondary | ICD-10-CM | POA: Diagnosis not present

## 2022-04-24 DIAGNOSIS — C50212 Malignant neoplasm of upper-inner quadrant of left female breast: Secondary | ICD-10-CM | POA: Insufficient documentation

## 2022-04-24 DIAGNOSIS — Z95828 Presence of other vascular implants and grafts: Secondary | ICD-10-CM

## 2022-04-24 LAB — CBC WITH DIFFERENTIAL/PLATELET
Abs Immature Granulocytes: 0.1 10*3/uL — ABNORMAL HIGH (ref 0.00–0.07)
Basophils Absolute: 0 10*3/uL (ref 0.0–0.1)
Basophils Relative: 0 %
Eosinophils Absolute: 0 10*3/uL (ref 0.0–0.5)
Eosinophils Relative: 0 %
HCT: 29.6 % — ABNORMAL LOW (ref 36.0–46.0)
Hemoglobin: 9.8 g/dL — ABNORMAL LOW (ref 12.0–15.0)
Immature Granulocytes: 2 %
Lymphocytes Relative: 10 %
Lymphs Abs: 0.5 10*3/uL — ABNORMAL LOW (ref 0.7–4.0)
MCH: 28.5 pg (ref 26.0–34.0)
MCHC: 33.1 g/dL (ref 30.0–36.0)
MCV: 86 fL (ref 80.0–100.0)
Monocytes Absolute: 0.8 10*3/uL (ref 0.1–1.0)
Monocytes Relative: 16 %
Neutro Abs: 3.6 10*3/uL (ref 1.7–7.7)
Neutrophils Relative %: 72 %
Platelets: 146 10*3/uL — ABNORMAL LOW (ref 150–400)
RBC: 3.44 MIL/uL — ABNORMAL LOW (ref 3.87–5.11)
RDW: 12.9 % (ref 11.5–15.5)
WBC: 5.1 10*3/uL (ref 4.0–10.5)
nRBC: 0 % (ref 0.0–0.2)

## 2022-04-24 LAB — COMPREHENSIVE METABOLIC PANEL
ALT: 21 U/L (ref 0–44)
AST: 14 U/L — ABNORMAL LOW (ref 15–41)
Albumin: 3.5 g/dL (ref 3.5–5.0)
Alkaline Phosphatase: 74 U/L (ref 38–126)
Anion gap: 5 (ref 5–15)
BUN: 14 mg/dL (ref 8–23)
CO2: 27 mmol/L (ref 22–32)
Calcium: 8.7 mg/dL — ABNORMAL LOW (ref 8.9–10.3)
Chloride: 109 mmol/L (ref 98–111)
Creatinine, Ser: 0.95 mg/dL (ref 0.44–1.00)
GFR, Estimated: >60 mL/min (ref >60)
Glucose, Bld: 111 mg/dL — ABNORMAL HIGH (ref 70–99)
Potassium: 3.7 mmol/L (ref 3.5–5.1)
Sodium: 141 mmol/L (ref 135–145)
Total Bilirubin: 0.2 mg/dL — ABNORMAL LOW (ref 0.3–1.2)
Total Protein: 6.2 g/dL — ABNORMAL LOW (ref 6.5–8.1)

## 2022-04-24 MED ORDER — SODIUM CHLORIDE 0.9 % IV SOLN
10.0000 mg | Freq: Once | INTRAVENOUS | Status: AC
  Administered 2022-04-24: 10 mg via INTRAVENOUS
  Filled 2022-04-24: qty 10

## 2022-04-24 MED ORDER — SODIUM CHLORIDE 0.9 % IV SOLN: Freq: Once | INTRAVENOUS | Status: AC

## 2022-04-24 MED ORDER — SODIUM CHLORIDE 0.9 % IV SOLN
600.0000 mg/m2 | Freq: Once | INTRAVENOUS | Status: AC
  Administered 2022-04-24: 1260 mg via INTRAVENOUS
  Filled 2022-04-24: qty 63

## 2022-04-24 MED ORDER — SODIUM CHLORIDE 0.9% FLUSH
10.0000 mL | INTRAVENOUS | Status: DC | PRN
  Administered 2022-04-24: 10 mL via INTRAVENOUS

## 2022-04-24 MED ORDER — HEPARIN SOD (PORK) LOCK FLUSH 100 UNIT/ML IV SOLN
500.0000 [IU] | Freq: Once | INTRAVENOUS | Status: AC | PRN
  Administered 2022-04-24: 500 [IU]

## 2022-04-24 MED ORDER — SODIUM CHLORIDE 0.9 % IV SOLN
150.0000 mg | Freq: Once | INTRAVENOUS | Status: AC
  Administered 2022-04-24: 150 mg via INTRAVENOUS
  Filled 2022-04-24: qty 150

## 2022-04-24 MED ORDER — PALONOSETRON HCL INJECTION 0.25 MG/5ML
0.2500 mg | Freq: Once | INTRAVENOUS | Status: AC
  Administered 2022-04-24: 0.25 mg via INTRAVENOUS
  Filled 2022-04-24: qty 5

## 2022-04-24 MED ORDER — DOXORUBICIN HCL CHEMO IV INJECTION 2 MG/ML
60.0000 mg/m2 | Freq: Once | INTRAVENOUS | Status: AC
  Administered 2022-04-24: 126 mg via INTRAVENOUS
  Filled 2022-04-24: qty 63

## 2022-04-24 MED ORDER — SODIUM CHLORIDE 0.9% FLUSH
10.0000 mL | INTRAVENOUS | Status: DC | PRN
  Administered 2022-04-24: 10 mL

## 2022-04-24 NOTE — Progress Notes (Signed)
Blood return noted before, during, and after adriamycin infusion  ?

## 2022-04-24 NOTE — Patient Instructions (Signed)
[facility] ?Discharge Instructions for Patients Receiving Chemotherapy ? ?Today you received the following chemotherapy agents adriamycin/cytoxan  ? ?To help prevent nausea and vomiting after your treatment, we encourage you to take your nausea medication as directed.  NO ZOFRAN FOR 3 DAYS AFTER TREATMENT  ?  ?If you develop nausea and vomiting that is not controlled by your nausea medication, call the clinic.  ? ?BELOW ARE SYMPTOMS THAT SHOULD BE REPORTED IMMEDIATELY: ?*FEVER GREATER THAN 100.5 F ?*CHILLS WITH OR WITHOUT FEVER ?NAUSEA AND VOMITING THAT IS NOT CONTROLLED WITH YOUR NAUSEA MEDICATION ?*UNUSUAL SHORTNESS OF BREATH ?*UNUSUAL BRUISING OR BLEEDING ?TENDERNESS IN MOUTH AND THROAT WITH OR WITHOUT PRESENCE OF ULCERS ?*URINARY PROBLEMS ?*BOWEL PROBLEMS ?UNUSUAL RASH ?Items with * indicate a potential emergency and should be followed up as soon as possible. ? ?Feel free to call the clinic you have any questions or concerns. The clinic phone number is [phone]. ? ?

## 2022-04-24 NOTE — Assessment & Plan Note (Signed)
03/05/2022:Left mastectomy: Grade 3 IDC 2.7 cm, focal high-grade DCIS, margins negative, 1/2 lymph nodes positive ER 99%, PR 99%, HER2 negative, Ki-67 50% ?03/24/2022:Oncotype DX recurrence score: 30 (distant recurrence at 9 years with hormone therapy alone: 23% ?? ?Treatment plan: ?1.??Adjuvant chemotherapy with dose dense Adriamycin and Cytoxan x4 followed by Taxol x12 ?2.?adjuvant antiestrogen therapy ?----------------------------------------------------------------------------------------------------------------------------------------- ?Current treatment: Cycle 2 dose dense Adriamycin and Cytoxan ?Chemotoxicities: ?1. Leukopenia: Awaiting the ANC from today: We might have to reduce the dosage of cycle 2. ?2. Anemia: Patient started at a baseline hemoglobin of 10.7 and today is 10.1. ?3. Thrombocytopenia: Monitoring ?? ?Patient did not experience significant nausea vomiting or any other adverse effects. ?The low-grade temperature that she had after treatment and Neulasta is probably because of Neulasta. ?? ?Return to clinic in 2 weeks for cycle 3 ?

## 2022-04-26 ENCOUNTER — Inpatient Hospital Stay: Payer: BC Managed Care – PPO

## 2022-04-26 ENCOUNTER — Other Ambulatory Visit: Payer: Self-pay

## 2022-04-26 VITALS — BP 141/74 | HR 74 | Temp 98.6°F | Resp 15

## 2022-04-26 DIAGNOSIS — Z170 Estrogen receptor positive status [ER+]: Secondary | ICD-10-CM

## 2022-04-26 DIAGNOSIS — Z5111 Encounter for antineoplastic chemotherapy: Secondary | ICD-10-CM | POA: Diagnosis not present

## 2022-04-26 MED ORDER — PEGFILGRASTIM-CBQV 6 MG/0.6ML ~~LOC~~ SOSY
6.0000 mg | PREFILLED_SYRINGE | Freq: Once | SUBCUTANEOUS | Status: AC
  Administered 2022-04-26: 6 mg via SUBCUTANEOUS
  Filled 2022-04-26: qty 0.6

## 2022-04-29 NOTE — Progress Notes (Incomplete)
? ?Patient Care Team: ?Patient, No Pcp Per (Inactive) as PCP - General (General Practice) ?[name], RN as Oncology Nurse Navigator ?[name], RN as Oncology Nurse Navigator ?[name], MD as Consulting Physician (Hematology and Oncology) ? ?DIAGNOSIS: No diagnosis found. ? ?SUMMARY OF ONCOLOGIC HISTORY: ?Oncology History  ?Malignant neoplasm of upper-inner quadrant of left breast in female, estrogen receptor positive ([place])  ?10/22/2021 Initial Diagnosis  ? Palpable left breast mass. Diagnostic mammogram and [place]: an irregular mass within the upper inner left breast, IDC 2.2 cm ER 99%, PR 99%, HER2 negative, Ki-67 50%.  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  Total area of involvement 6 cm ?  ?10/22/2021 Cancer Staging  ? Staging form: Breast, AJCC 8th Edition ?- Clinical stage from 10/22/2021: Stage IIA (cT2, cN0(f), cM0, G3, ER+, PR+, HER2-) - Signed by [name], MD on 01/02/2022 ?Stage prefix: Initial diagnosis ?Method of lymph node assessment: Core biopsy ?Histologic grading system: 3 grade system ? ?  ?11/04/2021 Breast MRI  ? 2.5 cm irregular enhancing mass UIQ left breast IDC with DCIS.  Additional non-mass enhancement extends anterior and inferior to the mass 2.5 cm total span 7.1 cm. ?  ?03/05/2022 Surgery  ? Left mastectomy: Grade 3 IDC 2.7 cm, focal high-grade DCIS, margins negative, 1/2 lymph nodes positive ER 99%, PR 99%, HER2 negative, Ki-67 50% ?  ?03/24/2022 Oncotype testing  ? Oncotype DX recurrence score: 30 (distant recurrence at 9 years with hormone therapy alone: 23% ?  ?04/10/2022 -  Chemotherapy  ? Patient is on Treatment Plan : BREAST ADJUVANT DOSE DENSE AC q14d / PACLitaxel q7d  ? ?  ?  ? ? ?CHIEF COMPLIANT: Left breast cancer on Adriamycin and Cytoxan  ? ?INTERVAL HISTORY: Amberli Ruegg is a  ?66 y.o. with the above mention  Left breast cancer. She presents to the clinic today for treatment  and follow-up. ? ?ALLERGIES:  has No Known Allergies. ? ?MEDICATIONS:  ?Current Outpatient Medications  ?Medication Sig Dispense Refill  ? dexamethasone (DECADRON) 4 MG tablet Take 1 tablet (4 mg total) by mouth daily. Take daily for 3 days after chemo. Take with food. 8 tablet 0  ? lidocaine-prilocaine (EMLA) cream Apply to affected area once 30 g 3  ? ondansetron (ZOFRAN) 8 MG tablet Take 1 tablet (8 mg total) by mouth 2 (two) times daily as needed. Start on the third day after chemotherapy. 30 tablet 1  ? prochlorperazine (COMPAZINE) 10 MG tablet Take 1 tablet (10 mg total) by mouth every 6 (six) hours as needed (Nausea or vomiting). 30 tablet 1  ? ?No current facility-administered medications for this visit.  ? ? ?PHYSICAL EXAMINATION: ?ECOG PERFORMANCE STATUS: {CHL ONC ECOG JK:[id number]} ? ?There were no vitals filed for this visit. ?There were no vitals filed for this visit. ? ?BREAST:*** No palpable masses or nodules in either right or left breasts. No palpable axillary supraclavicular or infraclavicular adenopathy no breast tenderness or nipple discharge. (exam performed in the presence of a chaperone) ? ?LABORATORY DATA:  ?I have reviewed the data as listed ? ?  Latest Ref Rng & Units 04/24/2022  ?  8:47 AM 04/17/2022  ?  8:46 AM 04/10/2022  ?  8:13 AM  ?CMP  ?Glucose 70 - 99 mg/dL 111   140   106    ?BUN 8 - 23 mg/dL _0 ?  Creatinine 0.44 - 1.00 mg/dL 0.95   0.93   0.90    ?Sodium 135 - 145 mmol/L 141   139   139    ?Potassium 3.5 - 5.1 mmol/L 3.7   3.7   3.6    ?Chloride 98 - 111 mmol/L 109   108   108    ?CO2 22 - 32 mmol/L _0 ?Calcium 8.9 - 10.3 mg/dL 8.7   8.6   8.5    ?Total Protein 6.5 - 8.1 g/dL 6.2   6.3   6.6    ?Total Bilirubin 0.3 - 1.2 mg/dL 0.2   0.5   0.5    ?Alkaline Phos 38 - 126 U/L 74   67   63    ?AST 15 - 41 U/L _1 ?ALT 0 - 44 U/L 21   36   14    ? ? ?Lab Results  ?Component Value Date  ? WBC 5.1 04/24/2022  ? HGB 9.8 (L) 04/24/2022  ? HCT 29.6 (L)  04/24/2022  ? MCV 86.0 04/24/2022  ? PLT 146 (L) 04/24/2022  ? NEUTROABS 3.6 04/24/2022  ? ? ?ASSESSMENT & PLAN:  ?No problem-specific Assessment & Plan notes found for this encounter. ? ? ? ?No orders of the defined types were placed in this encounter. ? ?The patient has a good understanding of the overall plan. she agrees with it. she will call with any problems that may develop before the next visit here. ?Total time spent: 30 mins including face to face time and time spent for planning, charting and co-ordination of care ? ? [name], CMA ?04/29/22 ? ? ? I Kaian Fahs, Sukhman Martine am scribing for Dr. Lindi Adie ? ?***  ?

## 2022-05-07 ENCOUNTER — Encounter (HOSPITAL_COMMUNITY): Payer: Self-pay

## 2022-05-07 ENCOUNTER — Emergency Department (HOSPITAL_COMMUNITY): Payer: BC Managed Care – PPO

## 2022-05-07 ENCOUNTER — Other Ambulatory Visit: Payer: Self-pay

## 2022-05-07 ENCOUNTER — Inpatient Hospital Stay (HOSPITAL_COMMUNITY)
Admission: EM | Admit: 2022-05-07 | Discharge: 2022-05-09 | DRG: 871 | Disposition: A | Discharge status: Home / Self Care | Payer: BC Managed Care – PPO | Attending: Internal Medicine | Admitting: Internal Medicine

## 2022-05-07 DIAGNOSIS — Z803 Family history of malignant neoplasm of breast: Secondary | ICD-10-CM | POA: Diagnosis not present

## 2022-05-07 DIAGNOSIS — Z8249 Family history of ischemic heart disease and other diseases of the circulatory system: Secondary | ICD-10-CM | POA: Diagnosis not present

## 2022-05-07 DIAGNOSIS — D84821 Immunodeficiency due to drugs: Secondary | ICD-10-CM | POA: Diagnosis present

## 2022-05-07 DIAGNOSIS — Z87410 Personal history of cervical dysplasia: Secondary | ICD-10-CM | POA: Diagnosis not present

## 2022-05-07 DIAGNOSIS — R590 Localized enlarged lymph nodes: Secondary | ICD-10-CM | POA: Diagnosis present

## 2022-05-07 DIAGNOSIS — C50212 Malignant neoplasm of upper-inner quadrant of left female breast: Secondary | ICD-10-CM | POA: Diagnosis present

## 2022-05-07 DIAGNOSIS — A419 Sepsis, unspecified organism: Secondary | ICD-10-CM | POA: Diagnosis not present

## 2022-05-07 DIAGNOSIS — R509 Fever, unspecified: Secondary | ICD-10-CM

## 2022-05-07 DIAGNOSIS — Z87891 Personal history of nicotine dependence: Secondary | ICD-10-CM | POA: Diagnosis not present

## 2022-05-07 DIAGNOSIS — T451X5A Adverse effect of antineoplastic and immunosuppressive drugs, initial encounter: Secondary | ICD-10-CM | POA: Diagnosis present

## 2022-05-07 DIAGNOSIS — D6481 Anemia due to antineoplastic chemotherapy: Secondary | ICD-10-CM | POA: Diagnosis present

## 2022-05-07 DIAGNOSIS — C50919 Malignant neoplasm of unspecified site of unspecified female breast: Principal | ICD-10-CM

## 2022-05-07 DIAGNOSIS — Z20822 Contact with and (suspected) exposure to covid-19: Secondary | ICD-10-CM | POA: Diagnosis present

## 2022-05-07 DIAGNOSIS — L818 Other specified disorders of pigmentation: Secondary | ICD-10-CM | POA: Diagnosis present

## 2022-05-07 DIAGNOSIS — J189 Pneumonia, unspecified organism: Secondary | ICD-10-CM | POA: Diagnosis not present

## 2022-05-07 DIAGNOSIS — Z170 Estrogen receptor positive status [ER+]: Secondary | ICD-10-CM | POA: Diagnosis not present

## 2022-05-07 DIAGNOSIS — Z9012 Acquired absence of left breast and nipple: Secondary | ICD-10-CM | POA: Diagnosis not present

## 2022-05-07 LAB — LIPASE, BLOOD: Lipase: 25 U/L (ref 11–51)

## 2022-05-07 LAB — COMPREHENSIVE METABOLIC PANEL
ALT: 16 U/L (ref 0–44)
AST: 14 U/L — ABNORMAL LOW (ref 15–41)
Albumin: 3.4 g/dL — ABNORMAL LOW (ref 3.5–5.0)
Alkaline Phosphatase: 76 U/L (ref 38–126)
Anion gap: 6 (ref 5–15)
BUN: 8 mg/dL (ref 8–23)
CO2: 25 mmol/L (ref 22–32)
Calcium: 8.6 mg/dL — ABNORMAL LOW (ref 8.9–10.3)
Chloride: 107 mmol/L (ref 98–111)
Creatinine, Ser: 0.98 mg/dL (ref 0.44–1.00)
GFR, Estimated: >60 mL/min (ref >60)
Glucose, Bld: 110 mg/dL — ABNORMAL HIGH (ref 70–99)
Potassium: 3.8 mmol/L (ref 3.5–5.1)
Sodium: 138 mmol/L (ref 135–145)
Total Bilirubin: 0.6 mg/dL (ref 0.3–1.2)
Total Protein: 6.7 g/dL (ref 6.5–8.1)

## 2022-05-07 LAB — RESP PANEL BY RT-PCR (FLU A&B, COVID) ARPGX2
Influenza A by PCR: NEGATIVE
Influenza B by PCR: NEGATIVE
SARS Coronavirus 2 by RT PCR: NEGATIVE

## 2022-05-07 LAB — PROTIME-INR
INR: 1.1 (ref 0.8–1.2)
Prothrombin Time: 14 seconds (ref 11.4–15.2)

## 2022-05-07 LAB — CBC WITH DIFFERENTIAL/PLATELET
Abs Immature Granulocytes: 0.35 10*3/uL — ABNORMAL HIGH (ref 0.00–0.07)
Basophils Absolute: 0 10*3/uL (ref 0.0–0.1)
Basophils Relative: 1 %
Eosinophils Absolute: 0 10*3/uL (ref 0.0–0.5)
Eosinophils Relative: 0 %
HCT: 29 % — ABNORMAL LOW (ref 36.0–46.0)
Hemoglobin: 9.6 g/dL — ABNORMAL LOW (ref 12.0–15.0)
Immature Granulocytes: 4 %
Lymphocytes Relative: 6 %
Lymphs Abs: 0.5 10*3/uL — ABNORMAL LOW (ref 0.7–4.0)
MCH: 28.7 pg (ref 26.0–34.0)
MCHC: 33.1 g/dL (ref 30.0–36.0)
MCV: 86.6 fL (ref 80.0–100.0)
Monocytes Absolute: 1.3 10*3/uL — ABNORMAL HIGH (ref 0.1–1.0)
Monocytes Relative: 16 %
Neutro Abs: 5.7 10*3/uL (ref 1.7–7.7)
Neutrophils Relative %: 73 %
Platelets: 155 10*3/uL (ref 150–400)
RBC: 3.35 MIL/uL — ABNORMAL LOW (ref 3.87–5.11)
RDW: 13 % (ref 11.5–15.5)
WBC: 7.9 10*3/uL (ref 4.0–10.5)
nRBC: 0.4 % — ABNORMAL HIGH (ref 0.0–0.2)

## 2022-05-07 LAB — APTT: aPTT: 29 seconds (ref 24–36)

## 2022-05-07 LAB — LACTIC ACID, PLASMA: Lactic Acid, Venous: 0.8 mmol/L (ref 0.5–1.9)

## 2022-05-07 MED ORDER — SODIUM CHLORIDE 0.9 % IV SOLN
2.0000 g | Freq: Once | INTRAVENOUS | Status: AC
  Administered 2022-05-07: 2 g via INTRAVENOUS
  Filled 2022-05-07: qty 12.5

## 2022-05-07 MED ORDER — VANCOMYCIN HCL IN DEXTROSE 1-5 GM/200ML-% IV SOLN
1000.0000 mg | Freq: Once | INTRAVENOUS | Status: AC
  Administered 2022-05-07: 1000 mg via INTRAVENOUS
  Filled 2022-05-07: qty 200

## 2022-05-07 MED ORDER — METRONIDAZOLE 500 MG/100ML IV SOLN
500.0000 mg | Freq: Once | INTRAVENOUS | Status: AC
  Administered 2022-05-07: 500 mg via INTRAVENOUS
  Filled 2022-05-07: qty 100

## 2022-05-07 MED ORDER — LACTATED RINGERS IV SOLN: INTRAVENOUS | Status: DC

## 2022-05-07 MED ORDER — MORPHINE SULFATE (PF) 4 MG/ML IV SOLN
4.0000 mg | Freq: Once | INTRAVENOUS | Status: AC
  Administered 2022-05-07: 4 mg via INTRAVENOUS
  Filled 2022-05-07: qty 1

## 2022-05-07 MED ORDER — IOHEXOL 350 MG/ML SOLN
100.0000 mL | Freq: Once | INTRAVENOUS | Status: AC | PRN
  Administered 2022-05-07: 100 mL via INTRAVENOUS

## 2022-05-07 MED ORDER — LACTATED RINGERS IV BOLUS (SEPSIS)
1000.0000 mL | Freq: Once | INTRAVENOUS | Status: AC
  Administered 2022-05-07: 1000 mL via INTRAVENOUS

## 2022-05-07 MED FILL — Dexamethasone Sodium Phosphate Inj 100 MG/10ML: INTRAMUSCULAR | Qty: 1 | Status: AC

## 2022-05-07 MED FILL — Fosaprepitant Dimeglumine For IV Infusion 150 MG (Base Eq): INTRAVENOUS | Qty: 5 | Status: AC

## 2022-05-07 NOTE — Progress Notes (Signed)
A consult was received from an ED physician for vancomycin and cefepime per pharmacy dosing.  The patient's profile has been reviewed for ht/wt/allergies/indication/available labs.   ?A one time order has been placed for vancomycin 1g and cefepime 2g.  Further antibiotics/pharmacy consults should be ordered by admitting physician if indicated.       ?                ?Thank you, ?Peggyann Juba, PharmD, BCPS ?05/07/2022  8:37 PM ? ?

## 2022-05-07 NOTE — ED Triage Notes (Signed)
Pt reports with fever and chills since last night. Pt is to receive a Ca tx tomorrow.  ?

## 2022-05-07 NOTE — ED Provider Notes (Signed)
[facility] DEPT Provider Note   CSN: [id number] Arrival date & time: 05/07/22  2012     History  Fever   Madison Becker is a 66 y.o. female history of breast cancer currently undergoing chemotherapy here for evaluation of fever.  Patient noted fever Tmax 101.9 at home earlier today.  She has felt overall poorly.  Cough which is nonproductive, myalgias, headache.  No neck pain, vision changes.  No chest pain, shortness of breath.  No pain or neck stiffness.  Did have some abdominal pain earlier today states initially located.  Initially located epigastric, periumbilical region, went to right lower abdomen, subsequently had bowel movement and pain improved.  No recent sick contacts.  Patient is also noted that she has had black spots on her tongue and growing to the palmar aspect bilateral hands over the last week.   Chemo meds>> Adriamycin and Cytoxan x4 followed by Taxol x 12  HPI     Home Medications Prior to Admission medications   Medication Sig Start Date End Date Taking? Authorizing Provider  dexamethasone (DECADRON) 4 MG tablet Take 1 tablet (4 mg total) by mouth daily. Take daily for 3 days after chemo. Take with food. 03/25/22   [name], MD  lidocaine-prilocaine (EMLA) cream Apply to affected area once 03/25/22   [name], MD  ondansetron (ZOFRAN) 8 MG tablet Take 1 tablet (8 mg total) by mouth 2 (two) times daily as needed. Start on the third day after chemotherapy. 03/25/22   [name], MD  prochlorperazine (COMPAZINE) 10 MG tablet Take 1 tablet (10 mg total) by mouth every 6 (six) hours as needed (Nausea or vomiting). 03/25/22   [name], MD      Allergies    Patient has no known allergies.    Review of Systems   Review of Systems  Constitutional: Negative.   HENT: Negative.    Respiratory:  Positive for cough. Negative for shortness of breath.   Cardiovascular: Negative.   Gastrointestinal:  Positive for abdominal pain  (resolved). Negative for anal bleeding, blood in stool, constipation, diarrhea, nausea, rectal pain and vomiting.  Genitourinary: Negative.   Musculoskeletal:  Positive for myalgias.  Skin:  Positive for rash.  Neurological:  Positive for headaches. Negative for dizziness, tremors, seizures, syncope, facial asymmetry, speech difficulty, weakness, light-headedness and numbness.  All other systems reviewed and are negative.  Physical Exam Updated Vital Signs BP (!) 133/95   Pulse 96   Temp (!) 100.9 F (38.3 C) (Oral)   Resp (!) 23   Ht '5\' 4"'$  (1.626 m)   Wt 98.4 kg   SpO2 98%   BMI 37.25 kg/m  Physical Exam Vitals and nursing note reviewed.  Constitutional:      General: She is not in acute distress.    Appearance: She is well-developed. She is not ill-appearing, toxic-appearing or diaphoretic.  HENT:     Head: Normocephalic and atraumatic.     Nose: Nose normal.     Mouth/Throat:     Mouth: Mucous membranes are moist.     Comments: Hyperpigmentation black spots to entire time. Eyes:     Pupils: Pupils are equal, round, and reactive to light.  Neck:     Comments: No neck stiffness or neck rigidity.  No meningismus Cardiovascular:     Rate and Rhythm: Tachycardia present.     Pulses: Normal pulses.     Heart sounds: Normal heart sounds.  Pulmonary:     Effort: Pulmonary effort  is normal. No respiratory distress.     Breath sounds: Normal breath sounds.     Comments: Clear bilaterally, speaks in full sentences without difficulty Abdominal:     General: Bowel sounds are normal. There is no distension.     Palpations: Abdomen is soft.     Tenderness: There is no abdominal tenderness. There is no right CVA tenderness, left CVA tenderness, guarding or rebound.     Comments: Soft, nontender  Musculoskeletal:        General: Normal range of motion.     Cervical back: Normal range of motion and neck supple.     Comments: No bony tenderness, full range of motion  Skin:     General: Skin is warm and dry.     Capillary Refill: Capillary refill takes less than 2 seconds.     Comments: Darkening hyperpigmentation to palmar aspect bilateral hands. No vesicles, erythema or warmth  Neurological:     General: No focal deficit present.     Mental Status: She is alert and oriented to person, place, and time.     Comments: Cranial nerves II through XII grossly intact Ambulatory without difficulty Intact sensation  Psychiatric:        Mood and Affect: Mood normal.   ED Results / Procedures / Treatments   Labs (all labs ordered are listed, but only abnormal results are displayed) Labs Reviewed  COMPREHENSIVE METABOLIC PANEL - Abnormal; Notable for the following components:      Result Value   Glucose, Bld 110 (*)    Calcium 8.6 (*)    Albumin 3.4 (*)    AST 14 (*)    All other components within normal limits  CBC WITH DIFFERENTIAL/PLATELET - Abnormal; Notable for the following components:   RBC 3.35 (*)    Hemoglobin 9.6 (*)    HCT 29.0 (*)    nRBC 0.4 (*)    Lymphs Abs 0.5 (*)    Monocytes Absolute 1.3 (*)    Abs Immature Granulocytes 0.35 (*)    All other components within normal limits  RESP PANEL BY RT-PCR (FLU A&B, COVID) ARPGX2  CULTURE, BLOOD (ROUTINE X 2)  CULTURE, BLOOD (ROUTINE X 2)  URINE CULTURE  LACTIC ACID, PLASMA  PROTIME-INR  APTT  LIPASE, BLOOD  LACTIC ACID, PLASMA  URINALYSIS, ROUTINE W REFLEX MICROSCOPIC    EKG EKG Interpretation  Date/Time:  Wednesday May 07 2022 20:23:07 EDT Ventricular Rate:  103 PR Interval:  142 QRS Duration: 84 QT Interval:  310 QTC Calculation: 406 R Axis:   3 Text Interpretation: Sinus tachycardia Borderline T abnormalities, anterior leads No significant change since last tracing Confirmed by Gareth Morgan [phone]) on 05/07/2022 11:34:12 PM  Radiology CT Angio Chest PE W and/or Wo Contrast  Result Date: 05/07/2022 CLINICAL DATA:  Acute abdominal pain. Shortness of breath and cough. Left breast  cancer. EXAM: CT ANGIOGRAPHY CHEST CT ABDOMEN AND PELVIS WITH CONTRAST TECHNIQUE: Multidetector CT imaging of the chest was performed using the standard protocol during bolus administration of intravenous contrast. Multiplanar CT image reconstructions and MIPs were obtained to evaluate the vascular anatomy. Multidetector CT imaging of the abdomen and pelvis was performed using the standard protocol during bolus administration of intravenous contrast. RADIATION DOSE REDUCTION: This exam was performed according to the departmental dose-optimization program which includes automated exposure control, adjustment of the mA and/or kV according to patient size and/or use of iterative reconstruction technique. CONTRAST:  179m OMNIPAQUE IOHEXOL 350 MG/ML SOLN COMPARISON:  CT  chest abdomen and pelvis 04/01/2022. FINDINGS: CTA CHEST FINDINGS Cardiovascular: Satisfactory opacification of the pulmonary arteries to the segmental level. No evidence of pulmonary embolism. Normal heart size. No pericardial effusion. Right-sided chest port catheter tip ends at the cavoatrial junction. Mediastinum/Nodes: There are prominent, but nonenlarged prevascular, subcarinal and left hilar lymph nodes, new from prior. The esophagus and visualized thyroid gland are within normal limits. Lungs/Pleura: There are new patchy and slightly nodular ground-glass opacities throughout both upper lobes and minimally in the left lower lobe. Scattered pulmonary nodules measuring 4 mm or less otherwise appear unchanged from prior. There is no pleural effusion or pneumothorax. Trachea and central airways are patent. Musculoskeletal: No acute fractures are visualized. Left mastectomy changes are again seen. There are surgical clips in the left axilla. 1.8 x 2.5 cm fluid collection in the left axilla appears unchanged left chest wall fluid collection has resolved in the interval. Again seen is scoliosis of the thoracic spine. No focal osseous lesions are seen.  Review of the MIP images confirms the above findings. CT ABDOMEN and PELVIS FINDINGS Hepatobiliary: No focal liver abnormality is seen. No gallstones, gallbladder wall thickening, or biliary dilatation. Pancreas: Unremarkable. No pancreatic ductal dilatation or surrounding inflammatory changes. Spleen: Normal in size without focal abnormality. Adrenals/Urinary Tract: Adrenal glands are unremarkable. Kidneys are normal, without renal calculi, focal lesion, or hydronephrosis. Bladder is unremarkable. Stomach/Bowel: Stomach is within normal limits. Appendix appears normal. No evidence of bowel wall thickening, distention, or inflammatory changes. There are scattered colonic diverticula. Vascular/Lymphatic: No significant vascular findings are present. No enlarged abdominal or pelvic lymph nodes. Reproductive: Uterus and bilateral adnexa are unremarkable. Other: No abdominal wall hernia or abnormality. There is a small fat containing umbilical hernia. There is no ascites. Musculoskeletal: No acute or significant osseous findings. Review of the MIP images confirms the above findings. IMPRESSION: 1. No evidence for pulmonary embolism. 2. New multifocal patchy ground-glass and nodular ground-glass opacities in the bilateral upper lobes and left lower lobe, likely infectious/inflammatory. 3. New prominent prevascular and left hilar lymph nodes, most likely reactive. Follow-up recommended in this patient with history of cancer. 4. Other small pulmonary nodules are stable. Attention on follow-up studies recommended. 5. Left chest wall fluid collection has resolved. Left axillary fluid collection appears stable. 6. No acute localizing process in the abdomen or pelvis. Electronically Signed   By: Ronney Asters M.D.   On: 05/07/2022 23:26   CT ABDOMEN PELVIS W CONTRAST  Result Date: 05/07/2022 CLINICAL DATA:  Acute abdominal pain. Shortness of breath and cough. Left breast cancer. EXAM: CT ANGIOGRAPHY CHEST CT ABDOMEN AND  PELVIS WITH CONTRAST TECHNIQUE: Multidetector CT imaging of the chest was performed using the standard protocol during bolus administration of intravenous contrast. Multiplanar CT image reconstructions and MIPs were obtained to evaluate the vascular anatomy. Multidetector CT imaging of the abdomen and pelvis was performed using the standard protocol during bolus administration of intravenous contrast. RADIATION DOSE REDUCTION: This exam was performed according to the departmental dose-optimization program which includes automated exposure control, adjustment of the mA and/or kV according to patient size and/or use of iterative reconstruction technique. CONTRAST:  134m OMNIPAQUE IOHEXOL 350 MG/ML SOLN COMPARISON:  CT chest abdomen and pelvis 04/01/2022. FINDINGS: CTA CHEST FINDINGS Cardiovascular: Satisfactory opacification of the pulmonary arteries to the segmental level. No evidence of pulmonary embolism. Normal heart size. No pericardial effusion. Right-sided chest port catheter tip ends at the cavoatrial junction. Mediastinum/Nodes: There are prominent, but nonenlarged prevascular, subcarinal and left hilar lymph  nodes, new from prior. The esophagus and visualized thyroid gland are within normal limits. Lungs/Pleura: There are new patchy and slightly nodular ground-glass opacities throughout both upper lobes and minimally in the left lower lobe. Scattered pulmonary nodules measuring 4 mm or less otherwise appear unchanged from prior. There is no pleural effusion or pneumothorax. Trachea and central airways are patent. Musculoskeletal: No acute fractures are visualized. Left mastectomy changes are again seen. There are surgical clips in the left axilla. 1.8 x 2.5 cm fluid collection in the left axilla appears unchanged left chest wall fluid collection has resolved in the interval. Again seen is scoliosis of the thoracic spine. No focal osseous lesions are seen. Review of the MIP images confirms the above  findings. CT ABDOMEN and PELVIS FINDINGS Hepatobiliary: No focal liver abnormality is seen. No gallstones, gallbladder wall thickening, or biliary dilatation. Pancreas: Unremarkable. No pancreatic ductal dilatation or surrounding inflammatory changes. Spleen: Normal in size without focal abnormality. Adrenals/Urinary Tract: Adrenal glands are unremarkable. Kidneys are normal, without renal calculi, focal lesion, or hydronephrosis. Bladder is unremarkable. Stomach/Bowel: Stomach is within normal limits. Appendix appears normal. No evidence of bowel wall thickening, distention, or inflammatory changes. There are scattered colonic diverticula. Vascular/Lymphatic: No significant vascular findings are present. No enlarged abdominal or pelvic lymph nodes. Reproductive: Uterus and bilateral adnexa are unremarkable. Other: No abdominal wall hernia or abnormality. There is a small fat containing umbilical hernia. There is no ascites. Musculoskeletal: No acute or significant osseous findings. Review of the MIP images confirms the above findings. IMPRESSION: 1. No evidence for pulmonary embolism. 2. New multifocal patchy ground-glass and nodular ground-glass opacities in the bilateral upper lobes and left lower lobe, likely infectious/inflammatory. 3. New prominent prevascular and left hilar lymph nodes, most likely reactive. Follow-up recommended in this patient with history of cancer. 4. Other small pulmonary nodules are stable. Attention on follow-up studies recommended. 5. Left chest wall fluid collection has resolved. Left axillary fluid collection appears stable. 6. No acute localizing process in the abdomen or pelvis. Electronically Signed   By: Ronney Asters M.D.   On: 05/07/2022 23:26   DG Chest Port 1 View  Result Date: 05/07/2022 CLINICAL DATA:  Fever, history of breast cancer EXAM: PORTABLE CHEST 1 VIEW COMPARISON:  CT chest dated 04/01/2022 FINDINGS: Lungs are clear.  No pleural effusion or pneumothorax. The  heart is normal in size. Right chest power port terminates at the cavoatrial junction. Surgical clips in the left chest wall/axilla. IMPRESSION: No evidence of acute cardiopulmonary disease. Electronically Signed   By: Julian Hy M.D.   On: 05/07/2022 20:57    Procedures .Critical Care Performed by: Nettie Elm, PA-C Authorized by: Nettie Elm, PA-C   Critical care provider statement:    Critical care time (minutes):  35   Critical care was necessary to treat or prevent imminent or life-threatening deterioration of the following conditions:  Sepsis   Critical care was time spent personally by me on the following activities:  Development of treatment plan with patient or surrogate, discussions with consultants, evaluation of patient's response to treatment, examination of patient, ordering and review of laboratory studies, ordering and review of radiographic studies, ordering and performing treatments and interventions, pulse oximetry, re-evaluation of patient's condition and review of old charts    Medications Ordered in ED Medications  lactated ringers infusion (has no administration in time range)  lactated ringers bolus 1,000 mL (1,000 mLs Intravenous New Bag/Given 05/07/22 2203)  ceFEPIme (MAXIPIME) 2 g in sodium  chloride 0.9 % 100 mL IVPB (2 g Intravenous New Bag/Given 05/07/22 2206)  metroNIDAZOLE (FLAGYL) IVPB 500 mg (500 mg Intravenous New Bag/Given 05/07/22 2241)  vancomycin (VANCOCIN) IVPB 1000 mg/200 mL premix (1,000 mg Intravenous New Bag/Given 05/07/22 2230)  morphine (PF) 4 MG/ML injection 4 mg (4 mg Intravenous Given 05/07/22 2236)  iohexol (OMNIPAQUE) 350 MG/ML injection 100 mL (100 mLs Intravenous Contrast Given 05/07/22 2302)   ED Course/ Medical Decision Making/ A&P    66 year old here for evaluation of fever.  Currently undergoing chemotherapy, last session 04/24/2022.  Temp max 101.9 at home.  Patient with mild aching headache, myalgias, cough.  No sick  contacts.  Has also noted rash to palmar aspect hand, tongue, hyperpigmentation in nature over 1 week.  Abdominal pain earlier today, resolved after bowel movement.  Code sepsis called on initial evaluation, tachycardic, febrile.  Broad-spectrum antibiotics given.  We will plan on labs and imaging  Labs and imaging personally viewed and interpreted:  CBC CBC without leukocytosis, hemoglobin 9.6, similar to prior CMP glucose 110, normal LFTs UA pending Lactic 0.8 Dg chest without acute abnormality Lipase 25 CTA chest multifocal infection versus inflammatory, given fever, new onset cough suspect infectious.  Given IV antibiotics CT abdomen pelvis without acute abnormality  Discussed result with patient in room.  Will admit for sepsis due to multifocal pneumonia.  CONSULT with Dr. Myna Hidalgo with [facility] who is agreeable to evaluate patient for admission  The patient appears reasonably stabilized for admission considering the current resources, flow, and capabilities available in the ED at this time, and I doubt any other [facility] requiring further screening and/or treatment in the ED prior to admission.                            Medical Decision Making Amount and/or Complexity of Data Reviewed Labs: ordered. Radiology: ordered. ECG/medicine tests: ordered.  Risk Prescription drug management. Decision regarding hospitalization.         Final Clinical Impression(s) / ED Diagnoses Final diagnoses:  Malignant neoplasm of female breast, unspecified estrogen receptor status, unspecified laterality, unspecified site of breast (Newborn)  Fever, unspecified fever cause  Sepsis with acute hypoxic respiratory failure without septic shock, due to unspecified organism [facility])  Multifocal pneumonia    Rx / DC Orders ED Discharge Orders     None         Amelia Burgard A, PA-C 05/07/22 2347    Gareth Morgan, MD 05/08/22 1025

## 2022-05-07 NOTE — Sepsis Progress Note (Signed)
Monitoring for the code sepsis protocol. °

## 2022-05-08 ENCOUNTER — Encounter (HOSPITAL_COMMUNITY): Payer: Self-pay | Admitting: Family Medicine

## 2022-05-08 ENCOUNTER — Inpatient Hospital Stay: Payer: BC Managed Care – PPO

## 2022-05-08 ENCOUNTER — Inpatient Hospital Stay: Payer: BC Managed Care – PPO | Admitting: Hematology and Oncology

## 2022-05-08 DIAGNOSIS — J189 Pneumonia, unspecified organism: Secondary | ICD-10-CM | POA: Diagnosis not present

## 2022-05-08 DIAGNOSIS — R590 Localized enlarged lymph nodes: Secondary | ICD-10-CM | POA: Diagnosis present

## 2022-05-08 LAB — URINALYSIS, ROUTINE W REFLEX MICROSCOPIC
Bilirubin Urine: NEGATIVE
Glucose, UA: NEGATIVE mg/dL
Hgb urine dipstick: NEGATIVE
Ketones, ur: NEGATIVE mg/dL
Leukocytes,Ua: NEGATIVE
Nitrite: NEGATIVE
Protein, ur: NEGATIVE mg/dL
Specific Gravity, Urine: 1.028 (ref 1.005–1.030)
pH: 5 (ref 5.0–8.0)

## 2022-05-08 LAB — BASIC METABOLIC PANEL
Anion gap: 5 (ref 5–15)
BUN: 7 mg/dL — ABNORMAL LOW (ref 8–23)
CO2: 26 mmol/L (ref 22–32)
Calcium: 8.1 mg/dL — ABNORMAL LOW (ref 8.9–10.3)
Chloride: 107 mmol/L (ref 98–111)
Creatinine, Ser: 0.91 mg/dL (ref 0.44–1.00)
GFR, Estimated: >60 mL/min (ref >60)
Glucose, Bld: 102 mg/dL — ABNORMAL HIGH (ref 70–99)
Potassium: 3.8 mmol/L (ref 3.5–5.1)
Sodium: 138 mmol/L (ref 135–145)

## 2022-05-08 LAB — CBC
HCT: 25.6 % — ABNORMAL LOW (ref 36.0–46.0)
Hemoglobin: 8.4 g/dL — ABNORMAL LOW (ref 12.0–15.0)
MCH: 28.7 pg (ref 26.0–34.0)
MCHC: 32.8 g/dL (ref 30.0–36.0)
MCV: 87.4 fL (ref 80.0–100.0)
Platelets: 154 10*3/uL (ref 150–400)
RBC: 2.93 MIL/uL — ABNORMAL LOW (ref 3.87–5.11)
RDW: 13.2 % (ref 11.5–15.5)
WBC: 7.2 10*3/uL (ref 4.0–10.5)
nRBC: 0 % (ref 0.0–0.2)

## 2022-05-08 LAB — STREP PNEUMONIAE URINARY ANTIGEN: Strep Pneumo Urinary Antigen: NEGATIVE

## 2022-05-08 LAB — HIV ANTIBODY (ROUTINE TESTING W REFLEX): HIV Screen 4th Generation wRfx: NONREACTIVE

## 2022-05-08 MED ORDER — SODIUM CHLORIDE 0.9 % IV SOLN
500.0000 mg | INTRAVENOUS | Status: DC
  Administered 2022-05-08 – 2022-05-09 (×2): 500 mg via INTRAVENOUS
  Filled 2022-05-08 (×2): qty 5

## 2022-05-08 MED ORDER — CHLORHEXIDINE GLUCONATE CLOTH 2 % EX PADS
6.0000 | MEDICATED_PAD | Freq: Every day | CUTANEOUS | Status: DC
  Administered 2022-05-09: 6 via TOPICAL

## 2022-05-08 MED ORDER — SODIUM CHLORIDE 0.9 % IV SOLN
2.0000 g | INTRAVENOUS | Status: DC
  Administered 2022-05-08 – 2022-05-09 (×2): 2 g via INTRAVENOUS
  Filled 2022-05-08 (×2): qty 20

## 2022-05-08 MED ORDER — ONDANSETRON HCL 4 MG/2ML IJ SOLN
4.0000 mg | Freq: Four times a day (QID) | INTRAMUSCULAR | Status: DC | PRN
Start: 2022-05-08 — End: 2022-05-09

## 2022-05-08 MED ORDER — ENOXAPARIN SODIUM 40 MG/0.4ML IJ SOSY
40.0000 mg | PREFILLED_SYRINGE | INTRAMUSCULAR | Status: DC
  Administered 2022-05-08 – 2022-05-09 (×2): 40 mg via SUBCUTANEOUS
  Filled 2022-05-08 (×2): qty 0.4

## 2022-05-08 MED ORDER — ACETAMINOPHEN 650 MG RE SUPP: 650.0000 mg | Freq: Four times a day (QID) | RECTAL | Status: DC | PRN

## 2022-05-08 MED ORDER — SODIUM CHLORIDE 0.9% FLUSH
3.0000 mL | Freq: Two times a day (BID) | INTRAVENOUS | Status: DC
  Administered 2022-05-08 – 2022-05-09 (×3): 3 mL via INTRAVENOUS

## 2022-05-08 MED ORDER — ONDANSETRON HCL 4 MG PO TABS: 4.0000 mg | ORAL_TABLET | Freq: Four times a day (QID) | ORAL | Status: DC | PRN

## 2022-05-08 MED ORDER — ACETAMINOPHEN 325 MG PO TABS
650.0000 mg | ORAL_TABLET | Freq: Four times a day (QID) | ORAL | Status: DC | PRN
Start: 2022-05-08 — End: 2022-05-09
  Administered 2022-05-08 – 2022-05-09 (×3): 650 mg via ORAL
  Filled 2022-05-08 (×3): qty 2

## 2022-05-08 MED ORDER — SENNOSIDES-DOCUSATE SODIUM 8.6-50 MG PO TABS: 1.0000 | ORAL_TABLET | Freq: Every evening | ORAL | Status: DC | PRN

## 2022-05-08 NOTE — H&P (Signed)
History and Physical    Madison Becker KDT:[id number] DOB: 05/18/1956 DOA: 05/07/2022  PCP: Pcp, No   Patient coming from: Home   Chief Complaint: Fever   HPI: Madison Becker is a pleasant 66 y.o. 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  Patient reports that she developed fever and chills the night of 05/06/2022, has had a mild headache, and mild cough without significant dyspnea, and without abdominal pain, dysuria, nausea, vomiting, or diarrhea.  Cough has been nonproductive.  She has not had any problems with her port.  ED Course: Upon arrival to the ED, patient is found to be febrile to 38.3 C with mild tachypnea and tachycardia.  EKG features sinus tachycardia with rate 103.  CTA chest is negative for PE but concerning for new multifocal opacities suspicious for infection.  Blood cultures were collected and the patient was given a liter of LR, cefepime, vancomycin, and Flagyl in the ED.  Review of Systems:  All other systems reviewed and apart from HPI, are negative.  Past Medical History:  Diagnosis Date   Breast cancer ([place]) 09/2021   GERD (gastroesophageal reflux disease)    History of cervical dysplasia     Past Surgical History:  Procedure Laterality Date   IR IMAGING GUIDED PORT INSERTION  04/09/2022   LEEP  2008   MASTECTOMY W/ SENTINEL NODE BIOPSY Left 03/05/2022   Procedure: LEFT MASTECTOMY WITH SENTINEL LYMPH NODE BIOPSY;  Surgeon: [name], MD;  Location: [facility];  Service: General;  Laterality: Left;    Social History:   reports that she has quit smoking. Her smoking use included cigarettes. She has never used smokeless tobacco. She reports current alcohol use. She reports that she does not use drugs.  No Known Allergies  Family History  Problem Relation Age of Onset   Hypertension Mother    Deep vein  thrombosis Mother    Coronary artery disease Father    Breast cancer Maternal Aunt      Prior to Admission medications   Medication Sig Start Date End Date Taking? Authorizing Provider  dexamethasone (DECADRON) 4 MG tablet Take 1 tablet (4 mg total) by mouth daily. Take daily for 3 days after chemo. Take with food. 03/25/22   [name], MD  lidocaine-prilocaine (EMLA) cream Apply to affected area once 03/25/22   [name], MD  ondansetron (ZOFRAN) 8 MG tablet Take 1 tablet (8 mg total) by mouth 2 (two) times daily as needed. Start on the third day after chemotherapy. 03/25/22   [name], MD  prochlorperazine (COMPAZINE) 10 MG tablet Take 1 tablet (10 mg total) by mouth every 6 (six) hours as needed (Nausea or vomiting). 03/25/22   [name], MD    Physical Exam: Vitals:   05/07/22 2200 05/07/22 2235 05/08/22 0015 05/08/22 0017  BP: (!) 133/95     Pulse: 96  91   Resp: (!) 23  17   Temp:    98.6 F (37 C)  TempSrc:    Oral  SpO2: 98%  97%   Weight:  98.4 kg    Height:  '5\' 4"'$  (1.626 m)      Constitutional: NAD, calm  Eyes: PERTLA, lids and conjunctivae normal ENMT: Mucous membranes are moist. Posterior pharynx clear of any exudate or lesions.   Neck: supple, no masses  Respiratory: no wheezing, no crackles. No accessory muscle use.  Cardiovascular: S1 & S2 heard, regular rate and rhythm. No significant JVD. Abdomen: No distension, no tenderness, soft. Bowel sounds active.  Musculoskeletal: no clubbing / cyanosis. No joint deformity upper and lower extremities.   Skin: no significant rashes, lesions, ulcers. Warm, dry, well-perfused. Neurologic: CN 2-12 grossly intact. Moving all extremities. Alert and oriented.  Psychiatric: Pleasant. Cooperative.    Labs and Imaging on Admission: I have personally reviewed following labs and imaging studies  CBC: Recent Labs  Lab 05/07/22 2150  WBC 7.9  NEUTROABS 5.7  HGB 9.6*  HCT 29.0*  MCV 86.6  PLT 637   Basic  Metabolic Panel: Recent Labs  Lab 05/07/22 2150  NA 138  K 3.8  CL 107  CO2 25  GLUCOSE 110*  BUN 8  CREATININE 0.98  CALCIUM 8.6*   GFR: Estimated Creatinine Clearance: 67 mL/min (by C-G formula based on SCr of 0.98 mg/dL). Liver Function Tests: Recent Labs  Lab 05/07/22 2150  AST 14*  ALT 16  ALKPHOS 76  BILITOT 0.6  PROT 6.7  ALBUMIN 3.4*   Recent Labs  Lab 05/07/22 2150  LIPASE 25   No results for input(s): AMMONIA in the last 168 hours. Coagulation Profile: Recent Labs  Lab 05/07/22 2150  INR 1.1   Cardiac Enzymes: No results for input(s): CKTOTAL, CKMB, CKMBINDEX, TROPONINI in the last 168 hours. BNP (last 3 results) No results for input(s): PROBNP in the last 8760 hours. HbA1C: No results for input(s): HGBA1C in the last 72 hours. CBG: No results for input(s): GLUCAP in the last 168 hours. Lipid Profile: No results for input(s): CHOL, HDL, LDLCALC, TRIG, CHOLHDL, LDLDIRECT in the last 72 hours. Thyroid Function Tests: No results for input(s): TSH, T4TOTAL, FREET4, T3FREE, THYROIDAB in the last 72 hours. Anemia Panel: No results for input(s): VITAMINB12, FOLATE, FERRITIN, TIBC, IRON, RETICCTPCT in the last 72 hours. Urine analysis:    Component Value Date/Time   COLORURINE STRAW (A) 05/08/2022 0130   APPEARANCEUR CLEAR 05/08/2022 0130   LABSPEC 1.028 05/08/2022 0130   PHURINE 5.0 05/08/2022 0130   GLUCOSEU NEGATIVE 05/08/2022 0130   HGBUR NEGATIVE 05/08/2022 0130   BILIRUBINUR NEGATIVE 05/08/2022 0130   KETONESUR NEGATIVE 05/08/2022 0130   PROTEINUR NEGATIVE 05/08/2022 0130   NITRITE NEGATIVE 05/08/2022 0130   LEUKOCYTESUR NEGATIVE 05/08/2022 0130   Sepsis Labs: '@LABRCNTIP'$ (procalcitonin:4,lacticidven:4) ) Recent Results (from the past 240 hour(s))  Resp Panel by RT-PCR (Flu A&B, Covid) Nasopharyngeal Swab     Status: None   Collection Time: 05/07/22 10:47 PM   Specimen: Nasopharyngeal Swab; Nasopharyngeal(NP) swabs in vial transport  medium  Result Value Ref Range Status   SARS Coronavirus 2 by RT PCR NEGATIVE NEGATIVE Final    Comment: (NOTE) SARS-CoV-2 target nucleic acids are NOT DETECTED.  The SARS-CoV-2 RNA is generally detectable in upper respiratory specimens during the acute phase of infection. The lowest concentration of SARS-CoV-2 viral copies this assay can detect is 138 copies/mL. A negative result does not preclude SARS-Cov-2 infection and should not be used as the sole basis for treatment or other patient management decisions. A negative result may occur with  improper specimen collection/handling, submission of specimen other than nasopharyngeal swab, presence of viral mutation(s) within the areas targeted by this assay, and inadequate number of viral copies(<138 copies/mL). A negative result must be combined with clinical observations, patient history, and epidemiological information. The expected result is Negative.  Fact Sheet for Patients:  EntrepreneurPulse.com.au  Fact Sheet for Healthcare Providers:  IncredibleEmployment.be  This test  is no t yet approved or cleared by the [place] and  has been authorized for detection and/or diagnosis of SARS-CoV-2 by FDA under an Emergency Use Authorization (EUA). This EUA will remain  in effect (meaning this test can be used) for the duration of the COVID-19 declaration under Section 564(b)(1) of the Act, 21 U.S.C.section 360bbb-3(b)(1), unless the authorization is terminated  or revoked sooner.       Influenza A by PCR NEGATIVE NEGATIVE Final   Influenza B by PCR NEGATIVE NEGATIVE Final    Comment: (NOTE) The Xpert Xpress SARS-CoV-2/FLU/RSV plus assay is intended as an aid in the diagnosis of influenza from Nasopharyngeal swab specimens and should not be used as a sole basis for treatment. Nasal washings and aspirates are unacceptable for Xpert Xpress SARS-CoV-2/FLU/RSV testing.  Fact Sheet for  Patients: EntrepreneurPulse.com.au  Fact Sheet for Healthcare Providers: IncredibleEmployment.be  This test is not yet approved or cleared by the [place] FDA and has been authorized for detection and/or diagnosis of SARS-CoV-2 by FDA under an Emergency Use Authorization (EUA). This EUA will remain in effect (meaning this test can be used) for the duration of the COVID-19 declaration under Section 564(b)(1) of the Act, 21 U.S.C. section 360bbb-3(b)(1), unless the authorization is terminated or revoked.  Performed at [facility], [place] [address] [place], [place] [address]      Radiological Exams on Admission: CT Angio Chest PE W and/or Wo Contrast  Result Date: 05/07/2022 CLINICAL DATA:  Acute abdominal pain. Shortness of breath and cough. Left breast cancer. EXAM: CT ANGIOGRAPHY CHEST CT ABDOMEN AND PELVIS WITH CONTRAST TECHNIQUE: Multidetector CT imaging of the chest was performed using the standard protocol during bolus administration of intravenous contrast. Multiplanar CT image reconstructions and MIPs were obtained to evaluate the vascular anatomy. Multidetector CT imaging of the abdomen and pelvis was performed using the standard protocol during bolus administration of intravenous contrast. RADIATION DOSE REDUCTION: This exam was performed according to the departmental dose-optimization program which includes automated exposure control, adjustment of the mA and/or kV according to patient size and/or use of iterative reconstruction technique. CONTRAST:  135m OMNIPAQUE IOHEXOL 350 MG/ML SOLN COMPARISON:  CT chest abdomen and pelvis 04/01/2022. FINDINGS: CTA CHEST FINDINGS Cardiovascular: Satisfactory opacification of the pulmonary arteries to the segmental level. No evidence of pulmonary embolism. Normal heart size. No pericardial effusion. Right-sided chest port catheter tip ends at the cavoatrial junction. Mediastinum/Nodes:  There are prominent, but nonenlarged prevascular, subcarinal and left hilar lymph nodes, new from prior. The esophagus and visualized thyroid gland are within normal limits. Lungs/Pleura: There are new patchy and slightly nodular ground-glass opacities throughout both upper lobes and minimally in the left lower lobe. Scattered pulmonary nodules measuring 4 mm or less otherwise appear unchanged from prior. There is no pleural effusion or pneumothorax. Trachea and central airways are patent. Musculoskeletal: No acute fractures are visualized. Left mastectomy changes are again seen. There are surgical clips in the left axilla. 1.8 x 2.5 cm fluid collection in the left axilla appears unchanged left chest wall fluid collection has resolved in the interval. Again seen is scoliosis of the thoracic spine. No focal osseous lesions are seen. Review of the MIP images confirms the above findings. CT ABDOMEN and PELVIS FINDINGS Hepatobiliary: No focal liver abnormality is seen. No gallstones, gallbladder wall thickening, or biliary dilatation. Pancreas: Unremarkable. No pancreatic ductal dilatation or surrounding inflammatory changes. Spleen: Normal in size without focal abnormality. Adrenals/Urinary Tract: Adrenal glands are unremarkable. Kidneys  are normal, without renal calculi, focal lesion, or hydronephrosis. Bladder is unremarkable. Stomach/Bowel: Stomach is within normal limits. Appendix appears normal. No evidence of bowel wall thickening, distention, or inflammatory changes. There are scattered colonic diverticula. Vascular/Lymphatic: No significant vascular findings are present. No enlarged abdominal or pelvic lymph nodes. Reproductive: Uterus and bilateral adnexa are unremarkable. Other: No abdominal wall hernia or abnormality. There is a small fat containing umbilical hernia. There is no ascites. Musculoskeletal: No acute or significant osseous findings. Review of the MIP images confirms the above findings.  IMPRESSION: 1. No evidence for pulmonary embolism. 2. New multifocal patchy ground-glass and nodular ground-glass opacities in the bilateral upper lobes and left lower lobe, likely infectious/inflammatory. 3. New prominent prevascular and left hilar lymph nodes, most likely reactive. Follow-up recommended in this patient with history of cancer. 4. Other small pulmonary nodules are stable. Attention on follow-up studies recommended. 5. Left chest wall fluid collection has resolved. Left axillary fluid collection appears stable. 6. No acute localizing process in the abdomen or pelvis. Electronically Signed   By: Ronney Asters M.D.   On: 05/07/2022 23:26   CT ABDOMEN PELVIS W CONTRAST  Result Date: 05/07/2022 CLINICAL DATA:  Acute abdominal pain. Shortness of breath and cough. Left breast cancer. EXAM: CT ANGIOGRAPHY CHEST CT ABDOMEN AND PELVIS WITH CONTRAST TECHNIQUE: Multidetector CT imaging of the chest was performed using the standard protocol during bolus administration of intravenous contrast. Multiplanar CT image reconstructions and MIPs were obtained to evaluate the vascular anatomy. Multidetector CT imaging of the abdomen and pelvis was performed using the standard protocol during bolus administration of intravenous contrast. RADIATION DOSE REDUCTION: This exam was performed according to the departmental dose-optimization program which includes automated exposure control, adjustment of the mA and/or kV according to patient size and/or use of iterative reconstruction technique. CONTRAST:  120m OMNIPAQUE IOHEXOL 350 MG/ML SOLN COMPARISON:  CT chest abdomen and pelvis 04/01/2022. FINDINGS: CTA CHEST FINDINGS Cardiovascular: Satisfactory opacification of the pulmonary arteries to the segmental level. No evidence of pulmonary embolism. Normal heart size. No pericardial effusion. Right-sided chest port catheter tip ends at the cavoatrial junction. Mediastinum/Nodes: There are prominent, but nonenlarged  prevascular, subcarinal and left hilar lymph nodes, new from prior. The esophagus and visualized thyroid gland are within normal limits. Lungs/Pleura: There are new patchy and slightly nodular ground-glass opacities throughout both upper lobes and minimally in the left lower lobe. Scattered pulmonary nodules measuring 4 mm or less otherwise appear unchanged from prior. There is no pleural effusion or pneumothorax. Trachea and central airways are patent. Musculoskeletal: No acute fractures are visualized. Left mastectomy changes are again seen. There are surgical clips in the left axilla. 1.8 x 2.5 cm fluid collection in the left axilla appears unchanged left chest wall fluid collection has resolved in the interval. Again seen is scoliosis of the thoracic spine. No focal osseous lesions are seen. Review of the MIP images confirms the above findings. CT ABDOMEN and PELVIS FINDINGS Hepatobiliary: No focal liver abnormality is seen. No gallstones, gallbladder wall thickening, or biliary dilatation. Pancreas: Unremarkable. No pancreatic ductal dilatation or surrounding inflammatory changes. Spleen: Normal in size without focal abnormality. Adrenals/Urinary Tract: Adrenal glands are unremarkable. Kidneys are normal, without renal calculi, focal lesion, or hydronephrosis. Bladder is unremarkable. Stomach/Bowel: Stomach is within normal limits. Appendix appears normal. No evidence of bowel wall thickening, distention, or inflammatory changes. There are scattered colonic diverticula. Vascular/Lymphatic: No significant vascular findings are present. No enlarged abdominal or pelvic lymph nodes. Reproductive: Uterus and  bilateral adnexa are unremarkable. Other: No abdominal wall hernia or abnormality. There is a small fat containing umbilical hernia. There is no ascites. Musculoskeletal: No acute or significant osseous findings. Review of the MIP images confirms the above findings. IMPRESSION: 1. No evidence for pulmonary  embolism. 2. New multifocal patchy ground-glass and nodular ground-glass opacities in the bilateral upper lobes and left lower lobe, likely infectious/inflammatory. 3. New prominent prevascular and left hilar lymph nodes, most likely reactive. Follow-up recommended in this patient with history of cancer. 4. Other small pulmonary nodules are stable. Attention on follow-up studies recommended. 5. Left chest wall fluid collection has resolved. Left axillary fluid collection appears stable. 6. No acute localizing process in the abdomen or pelvis. Electronically Signed   By: Ronney Asters M.D.   On: 05/07/2022 23:26   DG Chest Port 1 View  Result Date: 05/07/2022 CLINICAL DATA:  Fever, history of breast cancer EXAM: PORTABLE CHEST 1 VIEW COMPARISON:  CT chest dated 04/01/2022 FINDINGS: Lungs are clear.  No pleural effusion or pneumothorax. The heart is normal in size. Right chest power port terminates at the cavoatrial junction. Surgical clips in the left chest wall/axilla. IMPRESSION: No evidence of acute cardiopulmonary disease. Electronically Signed   By: Julian Hy M.D.   On: 05/07/2022 20:57    EKG: Independently reviewed. Sinus tachycardia, rate 103.   Assessment/Plan   1. Pneumonia  - Presents with fever and cough and has CT findings suggestive of multifocal pneumonia  - Blood cultures were collected in ED and antibiotics started  - Check strep pneumo and legionella antigens, treat with Rocephin and azithromycin    2. Breast cancer  - Undergoing treatment with Adriamycin and Cytoxan  - Prominent left hilar lymph nodes noted on CT in ED and likely reactive but follow-up imaging recommended    DVT prophylaxis: Lovenox  Code Status: Full  Level of Care: Level of care: Telemetry Family Communication: none present  Disposition Plan:  Patient is from: home  Anticipated d/c is to: Home  Anticipated d/c date is: 05/09/22 Patient currently: Pending improvement in infection, transition to  oral medications  Consults called: none  Admission status: Inpatient     Vianne Bulls, MD [facility]  05/08/2022, 2:34 AM

## 2022-05-08 NOTE — Progress Notes (Signed)
  Transition of Care [facility]) Screening Note   Patient Details  Name: Rosemae Mcquown Date of Birth: 15-Oct-1956   Transition of Care [facility]) CM/SW Contact:    Bethann Berkshire, [place] Phone Number: 05/08/2022, 1:30 PM    Transition of Care Department [facility]) has reviewed patient and no TOC needs have been identified at this time. We will continue to monitor patient advancement through interdisciplinary progression rounds. If new patient transition needs arise, please place a TOC consult.

## 2022-05-08 NOTE — ED Notes (Signed)
Pt ambulatory to restroom w/out assistance.  

## 2022-05-08 NOTE — Progress Notes (Signed)
PROGRESS NOTE    Madison Becker  IOE:[id number] DOB: Jul 08, 1956 DOA: 05/07/2022 PCP: Pcp, No    Brief Narrative:  66 year old with history of left breast cancer undergoing treatment with Adriamycin and Cytoxan after surgical resection presented to the ER with fever and chills for 2 days.  Mild headache, occasional dry cough without significant dyspnea.  In the emergency room temperature 10 1.  Recommend tachycardia.  CTA of the chest negative for pulmonary embolism but concerning for new multifocal opacities suspicious for infection.  Blood cultures were collected and patient was started on antibiotics.   Assessment & Plan:   Sepsis present on admission, multifocal pneumonia.  Immunocompromised with ongoing chemotherapy Agree with admission given severity of symptoms. Currently receiving Rocephin and azithromycin. Chest physiotherapy, incentive spirometry, deep breathing exercises, sputum induction, mucolytic's and bronchodilators. Sputum cultures, blood cultures, Legionella and streptococcal antigen pending and negative so far. Supplemental oxygen to keep saturations more than 90%. Mobilize.  Breast cancer: Currently on chemotherapy.  will add oncology team on the treatment team for follow-up.  She is scheduled for chemotherapy today, however they have to postpone.   DVT prophylaxis: enoxaparin (LOVENOX) injection 40 mg Start: 05/08/22 1000   Code Status: Full code Family Communication: None Disposition Plan: Status is: Inpatient Remains inpatient appropriate because: Significant sepsis on presentation.  Remains on IV antibiotics.     Consultants:  None  Procedures:  None  Antimicrobials:  Rocephin azithromycin 5/17---   Subjective: Patient seen and examined.  Currently denies any complaints.  Has some dry cough but no sputum production.  She has some global headache but remains afebrile.  Able to get up and walk to the bedside commode.  Objective: Vitals:   05/08/22  1115 05/08/22 1200 05/08/22 1231 05/08/22 1312  BP: (!) 145/78 139/83  140/73  Pulse: 86 76  73  Resp: '19 17  16  '$ Temp:   98.9 F (37.2 C) 99.3 F (37.4 C)  TempSrc:   Oral Oral  SpO2: 100% 100%  98%  Weight:      Height:        Intake/Output Summary (Last 24 hours) at 05/08/2022 1316 Last data filed at 05/08/2022 0944 Gross per 24 hour  Intake 1927.47 ml  Output --  Net 1927.47 ml   Filed Weights   05/07/22 2235  Weight: 98.4 kg    Examination:  General exam: Appears calm and comfortable, not in any distress. Respiratory system: Clear to auscultation. Respiratory effort normal.  On room air. Port-A-Cath present right chest wall.  Nontender and intact. Cardiovascular system: S1 & S2 heard, RRR. No JVD, murmurs, rubs, gallops or clicks. No pedal edema. Gastrointestinal system: Soft.  Nondistended.  Nontender.  Bowel sound present. Central nervous system: Alert and oriented. No focal neurological deficits. Extremities: Symmetric 5 x 5 power. Skin: No rashes, lesions or ulcers Psychiatry: Judgement and insight appear normal. Mood & affect appropriate.     Data Reviewed: I have personally reviewed following labs and imaging studies  CBC: Recent Labs  Lab 05/07/22 2150 05/08/22 0430  WBC 7.9 7.2  NEUTROABS 5.7  --   HGB 9.6* 8.4*  HCT 29.0* 25.6*  MCV 86.6 87.4  PLT 155 182   Basic Metabolic Panel: Recent Labs  Lab 05/07/22 2150 05/08/22 0430  NA 138 138  K 3.8 3.8  CL 107 107  CO2 25 26  GLUCOSE 110* 102*  BUN 8 7*  CREATININE 0.98 0.91  CALCIUM 8.6* 8.1*   GFR: Estimated Creatinine Clearance: 72.1  mL/min (by C-G formula based on SCr of 0.91 mg/dL). Liver Function Tests: Recent Labs  Lab 05/07/22 2150  AST 14*  ALT 16  ALKPHOS 76  BILITOT 0.6  PROT 6.7  ALBUMIN 3.4*   Recent Labs  Lab 05/07/22 2150  LIPASE 25   No results for input(s): AMMONIA in the last 168 hours. Coagulation Profile: Recent Labs  Lab 05/07/22 2150  INR 1.1    Cardiac Enzymes: No results for input(s): CKTOTAL, CKMB, CKMBINDEX, TROPONINI in the last 168 hours. BNP (last 3 results) No results for input(s): PROBNP in the last 8760 hours. HbA1C: No results for input(s): HGBA1C in the last 72 hours. CBG: No results for input(s): GLUCAP in the last 168 hours. Lipid Profile: No results for input(s): CHOL, HDL, LDLCALC, TRIG, CHOLHDL, LDLDIRECT in the last 72 hours. Thyroid Function Tests: No results for input(s): TSH, T4TOTAL, FREET4, T3FREE, THYROIDAB in the last 72 hours. Anemia Panel: No results for input(s): VITAMINB12, FOLATE, FERRITIN, TIBC, IRON, RETICCTPCT in the last 72 hours. Sepsis Labs: Recent Labs  Lab 05/07/22 2150  LATICACIDVEN 0.8    Recent Results (from the past 240 hour(s))  Blood Culture (routine x 2)     Status: None (Preliminary result)   Collection Time: 05/07/22  9:35 PM   Specimen: BLOOD  Result Value Ref Range Status   Specimen Description   Final    BLOOD LEFT ANTECUBITAL Performed at [facility] [address], [place], [place] [address]    Special Requests   Final    BOTTLES DRAWN AEROBIC AND ANAEROBIC Blood Culture results may not be optimal due to an excessive volume of blood received in culture bottles Performed at [facility] [address] [place], [place] [address]    Culture   Final    NO GROWTH < 12 HOURS Performed at [facility] [address] [place], [place] [address]    Report Status PENDING  Incomplete  Blood Culture (routine x 2)     Status: None (Preliminary result)   Collection Time: 05/07/22  9:50 PM   Specimen: BLOOD  Result Value Ref Range Status   Specimen Description   Final    BLOOD RIGHT ANTECUBITAL Performed at [facility] [address] [place], [place] [address]    Special Requests   Final    BOTTLES DRAWN AEROBIC AND ANAEROBIC Blood Culture results may not be optimal due to an excessive volume of  blood received in culture bottles Performed at [facility] [address], [place], [place] [address]    Culture   Final    NO GROWTH < 12 HOURS Performed at [facility] [address], [place], [place] [address]    Report Status PENDING  Incomplete  Resp Panel by RT-PCR (Flu A&B, Covid) Nasopharyngeal Swab     Status: None   Collection Time: 05/07/22 10:47 PM   Specimen: Nasopharyngeal Swab; Nasopharyngeal(NP) swabs in vial transport medium  Result Value Ref Range Status   SARS Coronavirus 2 by RT PCR NEGATIVE NEGATIVE Final    Comment: (NOTE) SARS-CoV-2 target nucleic acids are NOT DETECTED.  The SARS-CoV-2 RNA is generally detectable in upper respiratory specimens during the acute phase of infection. The lowest concentration of SARS-CoV-2 viral copies this assay can detect is 138 copies/mL. A negative result does not preclude SARS-Cov-2 infection and should not be used as the sole basis for treatment or other patient management decisions. A negative  result may occur with  improper specimen collection/handling, submission of specimen other than nasopharyngeal swab, presence of viral mutation(s) within the areas targeted by this assay, and inadequate number of viral copies(<138 copies/mL). A negative result must be combined with clinical observations, patient history, and epidemiological information. The expected result is Negative.  Fact Sheet for Patients:  EntrepreneurPulse.com.au  Fact Sheet for Healthcare Providers:  IncredibleEmployment.be  This test is no t yet approved or cleared by the [place] FDA and  has been authorized for detection and/or diagnosis of SARS-CoV-2 by FDA under an Emergency Use Authorization (EUA). This EUA will remain  in effect (meaning this test can be used) for the duration of the COVID-19 declaration under Section 564(b)(1) of the Act, 21 U.S.C.section 360bbb-3(b)(1), unless  the authorization is terminated  or revoked sooner.       Influenza A by PCR NEGATIVE NEGATIVE Final   Influenza B by PCR NEGATIVE NEGATIVE Final    Comment: (NOTE) The Xpert Xpress SARS-CoV-2/FLU/RSV plus assay is intended as an aid in the diagnosis of influenza from Nasopharyngeal swab specimens and should not be used as a sole basis for treatment. Nasal washings and aspirates are unacceptable for Xpert Xpress SARS-CoV-2/FLU/RSV testing.  Fact Sheet for Patients: EntrepreneurPulse.com.au  Fact Sheet for Healthcare Providers: IncredibleEmployment.be  This test is not yet approved or cleared by the [place] FDA and has been authorized for detection and/or diagnosis of SARS-CoV-2 by FDA under an Emergency Use Authorization (EUA). This EUA will remain in effect (meaning this test can be used) for the duration of the COVID-19 declaration under Section 564(b)(1) of the Act, 21 U.S.C. section 360bbb-3(b)(1), unless the authorization is terminated or revoked.  Performed at [facility], [place] [address] [place], [place] [address]          Radiology Studies: CT Angio Chest PE W and/or Wo Contrast  Result Date: 05/07/2022 CLINICAL DATA:  Acute abdominal pain. Shortness of breath and cough. Left breast cancer. EXAM: CT ANGIOGRAPHY CHEST CT ABDOMEN AND PELVIS WITH CONTRAST TECHNIQUE: Multidetector CT imaging of the chest was performed using the standard protocol during bolus administration of intravenous contrast. Multiplanar CT image reconstructions and MIPs were obtained to evaluate the vascular anatomy. Multidetector CT imaging of the abdomen and pelvis was performed using the standard protocol during bolus administration of intravenous contrast. RADIATION DOSE REDUCTION: This exam was performed according to the departmental dose-optimization program which includes automated exposure control, adjustment of the mA and/or kV  according to patient size and/or use of iterative reconstruction technique. CONTRAST:  133m OMNIPAQUE IOHEXOL 350 MG/ML SOLN COMPARISON:  CT chest abdomen and pelvis 04/01/2022. FINDINGS: CTA CHEST FINDINGS Cardiovascular: Satisfactory opacification of the pulmonary arteries to the segmental level. No evidence of pulmonary embolism. Normal heart size. No pericardial effusion. Right-sided chest port catheter tip ends at the cavoatrial junction. Mediastinum/Nodes: There are prominent, but nonenlarged prevascular, subcarinal and left hilar lymph nodes, new from prior. The esophagus and visualized thyroid gland are within normal limits. Lungs/Pleura: There are new patchy and slightly nodular ground-glass opacities throughout both upper lobes and minimally in the left lower lobe. Scattered pulmonary nodules measuring 4 mm or less otherwise appear unchanged from prior. There is no pleural effusion or pneumothorax. Trachea and central airways are patent. Musculoskeletal: No acute fractures are visualized. Left mastectomy changes are again seen. There are surgical clips in the left axilla. 1.8 x 2.5 cm fluid collection in the left axilla appears unchanged left chest wall fluid collection  has resolved in the interval. Again seen is scoliosis of the thoracic spine. No focal osseous lesions are seen. Review of the MIP images confirms the above findings. CT ABDOMEN and PELVIS FINDINGS Hepatobiliary: No focal liver abnormality is seen. No gallstones, gallbladder wall thickening, or biliary dilatation. Pancreas: Unremarkable. No pancreatic ductal dilatation or surrounding inflammatory changes. Spleen: Normal in size without focal abnormality. Adrenals/Urinary Tract: Adrenal glands are unremarkable. Kidneys are normal, without renal calculi, focal lesion, or hydronephrosis. Bladder is unremarkable. Stomach/Bowel: Stomach is within normal limits. Appendix appears normal. No evidence of bowel wall thickening, distention, or  inflammatory changes. There are scattered colonic diverticula. Vascular/Lymphatic: No significant vascular findings are present. No enlarged abdominal or pelvic lymph nodes. Reproductive: Uterus and bilateral adnexa are unremarkable. Other: No abdominal wall hernia or abnormality. There is a small fat containing umbilical hernia. There is no ascites. Musculoskeletal: No acute or significant osseous findings. Review of the MIP images confirms the above findings. IMPRESSION: 1. No evidence for pulmonary embolism. 2. New multifocal patchy ground-glass and nodular ground-glass opacities in the bilateral upper lobes and left lower lobe, likely infectious/inflammatory. 3. New prominent prevascular and left hilar lymph nodes, most likely reactive. Follow-up recommended in this patient with history of cancer. 4. Other small pulmonary nodules are stable. Attention on follow-up studies recommended. 5. Left chest wall fluid collection has resolved. Left axillary fluid collection appears stable. 6. No acute localizing process in the abdomen or pelvis. Electronically Signed   By: Ronney Asters M.D.   On: 05/07/2022 23:26   CT ABDOMEN PELVIS W CONTRAST  Result Date: 05/07/2022 CLINICAL DATA:  Acute abdominal pain. Shortness of breath and cough. Left breast cancer. EXAM: CT ANGIOGRAPHY CHEST CT ABDOMEN AND PELVIS WITH CONTRAST TECHNIQUE: Multidetector CT imaging of the chest was performed using the standard protocol during bolus administration of intravenous contrast. Multiplanar CT image reconstructions and MIPs were obtained to evaluate the vascular anatomy. Multidetector CT imaging of the abdomen and pelvis was performed using the standard protocol during bolus administration of intravenous contrast. RADIATION DOSE REDUCTION: This exam was performed according to the departmental dose-optimization program which includes automated exposure control, adjustment of the mA and/or kV according to patient size and/or use of  iterative reconstruction technique. CONTRAST:  171m OMNIPAQUE IOHEXOL 350 MG/ML SOLN COMPARISON:  CT chest abdomen and pelvis 04/01/2022. FINDINGS: CTA CHEST FINDINGS Cardiovascular: Satisfactory opacification of the pulmonary arteries to the segmental level. No evidence of pulmonary embolism. Normal heart size. No pericardial effusion. Right-sided chest port catheter tip ends at the cavoatrial junction. Mediastinum/Nodes: There are prominent, but nonenlarged prevascular, subcarinal and left hilar lymph nodes, new from prior. The esophagus and visualized thyroid gland are within normal limits. Lungs/Pleura: There are new patchy and slightly nodular ground-glass opacities throughout both upper lobes and minimally in the left lower lobe. Scattered pulmonary nodules measuring 4 mm or less otherwise appear unchanged from prior. There is no pleural effusion or pneumothorax. Trachea and central airways are patent. Musculoskeletal: No acute fractures are visualized. Left mastectomy changes are again seen. There are surgical clips in the left axilla. 1.8 x 2.5 cm fluid collection in the left axilla appears unchanged left chest wall fluid collection has resolved in the interval. Again seen is scoliosis of the thoracic spine. No focal osseous lesions are seen. Review of the MIP images confirms the above findings. CT ABDOMEN and PELVIS FINDINGS Hepatobiliary: No focal liver abnormality is seen. No gallstones, gallbladder wall thickening, or biliary dilatation. Pancreas: Unremarkable. No pancreatic ductal  dilatation or surrounding inflammatory changes. Spleen: Normal in size without focal abnormality. Adrenals/Urinary Tract: Adrenal glands are unremarkable. Kidneys are normal, without renal calculi, focal lesion, or hydronephrosis. Bladder is unremarkable. Stomach/Bowel: Stomach is within normal limits. Appendix appears normal. No evidence of bowel wall thickening, distention, or inflammatory changes. There are scattered  colonic diverticula. Vascular/Lymphatic: No significant vascular findings are present. No enlarged abdominal or pelvic lymph nodes. Reproductive: Uterus and bilateral adnexa are unremarkable. Other: No abdominal wall hernia or abnormality. There is a small fat containing umbilical hernia. There is no ascites. Musculoskeletal: No acute or significant osseous findings. Review of the MIP images confirms the above findings. IMPRESSION: 1. No evidence for pulmonary embolism. 2. New multifocal patchy ground-glass and nodular ground-glass opacities in the bilateral upper lobes and left lower lobe, likely infectious/inflammatory. 3. New prominent prevascular and left hilar lymph nodes, most likely reactive. Follow-up recommended in this patient with history of cancer. 4. Other small pulmonary nodules are stable. Attention on follow-up studies recommended. 5. Left chest wall fluid collection has resolved. Left axillary fluid collection appears stable. 6. No acute localizing process in the abdomen or pelvis. Electronically Signed   By: Ronney Asters M.D.   On: 05/07/2022 23:26   DG Chest Port 1 View  Result Date: 05/07/2022 CLINICAL DATA:  Fever, history of breast cancer EXAM: PORTABLE CHEST 1 VIEW COMPARISON:  CT chest dated 04/01/2022 FINDINGS: Lungs are clear.  No pleural effusion or pneumothorax. The heart is normal in size. Right chest power port terminates at the cavoatrial junction. Surgical clips in the left chest wall/axilla. IMPRESSION: No evidence of acute cardiopulmonary disease. Electronically Signed   By: Julian Hy M.D.   On: 05/07/2022 20:57        Scheduled Meds:  enoxaparin (LOVENOX) injection  40 mg Subcutaneous Q24H   sodium chloride flush  3 mL Intravenous Q12H   Continuous Infusions:  azithromycin Stopped (05/08/22 0409)   cefTRIAXone (ROCEPHIN)  IV Stopped (05/08/22 0600)     LOS: 1 day    Time spent: 35 minutes    Barb Merino, MD [facility] Pager  [phone]

## 2022-05-08 NOTE — Progress Notes (Signed)
Madison Becker   DOB:[birth year]   IW#:[id number]   ERD#:[id number]  Subjective:Madison Becker is a 66 year old woman who is undergoing adjuvant Adriamycin and Cytoxan and was admitted with concern for sepsis and pneumonia.  She was treated with antibiotics and she tells me that she is feeling much better today.  She is getting up and walking around.  She notes she is regaining her energy.  She does have questions about what her discharge plan will be for follow-up with oncology and also about the hyperpigmentation on her hands and discoloration of her tongue.   Objective:  Vitals:   05/08/22 1231 05/08/22 1312  BP:  140/73  Pulse:  73  Resp:  16  Temp: 98.9 F (37.2 C) 99.3 F (37.4 C)  SpO2:  98%    Body mass index is 37.25 kg/m.  Intake/Output Summary (Last 24 hours) at 05/08/2022 1643 Last data filed at 05/08/2022 0944 Gross per 24 hour  Intake 1927.47 ml  Output --  Net 1927.47 ml     Sclerae unicteric  Oropharynx shows no thrush or other lesions  No cervical or supraclavicular adenopathy  Lungs no rales or wheezes--auscultated anterolaterally  Heart regular rate and rhythm  Abdomen soft, +BS  Neuro nonfocal  Breast exam:     Studies:  CT Angio Chest PE W and/or Wo Contrast  Result Date: 05/07/2022 CLINICAL DATA:  Acute abdominal pain. Shortness of breath and cough. Left breast cancer. EXAM: CT ANGIOGRAPHY CHEST CT ABDOMEN AND PELVIS WITH CONTRAST TECHNIQUE: Multidetector CT imaging of the chest was performed using the standard protocol during bolus administration of intravenous contrast. Multiplanar CT image reconstructions and MIPs were obtained to evaluate the vascular anatomy. Multidetector CT imaging of the abdomen and pelvis was performed using the standard protocol during bolus administration of intravenous contrast. RADIATION DOSE REDUCTION: This exam was performed according to the departmental dose-optimization program which includes automated exposure control, adjustment  of the mA and/or kV according to patient size and/or use of iterative reconstruction technique. CONTRAST:  169m OMNIPAQUE IOHEXOL 350 MG/ML SOLN COMPARISON:  CT chest abdomen and pelvis 04/01/2022. FINDINGS: CTA CHEST FINDINGS Cardiovascular: Satisfactory opacification of the pulmonary arteries to the segmental level. No evidence of pulmonary embolism. Normal heart size. No pericardial effusion. Right-sided chest port catheter tip ends at the cavoatrial junction. Mediastinum/Nodes: There are prominent, but nonenlarged prevascular, subcarinal and left hilar lymph nodes, new from prior. The esophagus and visualized thyroid gland are within normal limits. Lungs/Pleura: There are new patchy and slightly nodular ground-glass opacities throughout both upper lobes and minimally in the left lower lobe. Scattered pulmonary nodules measuring 4 mm or less otherwise appear unchanged from prior. There is no pleural effusion or pneumothorax. Trachea and central airways are patent. Musculoskeletal: No acute fractures are visualized. Left mastectomy changes are again seen. There are surgical clips in the left axilla. 1.8 x 2.5 cm fluid collection in the left axilla appears unchanged left chest wall fluid collection has resolved in the interval. Again seen is scoliosis of the thoracic spine. No focal osseous lesions are seen. Review of the MIP images confirms the above findings. CT ABDOMEN and PELVIS FINDINGS Hepatobiliary: No focal liver abnormality is seen. No gallstones, gallbladder wall thickening, or biliary dilatation. Pancreas: Unremarkable. No pancreatic ductal dilatation or surrounding inflammatory changes. Spleen: Normal in size without focal abnormality. Adrenals/Urinary Tract: Adrenal glands are unremarkable. Kidneys are normal, without renal calculi, focal lesion, or hydronephrosis. Bladder is unremarkable. Stomach/Bowel: Stomach is within normal limits. Appendix appears normal. No evidence  of bowel wall thickening,  distention, or inflammatory changes. There are scattered colonic diverticula. Vascular/Lymphatic: No significant vascular findings are present. No enlarged abdominal or pelvic lymph nodes. Reproductive: Uterus and bilateral adnexa are unremarkable. Other: No abdominal wall hernia or abnormality. There is a small fat containing umbilical hernia. There is no ascites. Musculoskeletal: No acute or significant osseous findings. Review of the MIP images confirms the above findings. IMPRESSION: 1. No evidence for pulmonary embolism. 2. New multifocal patchy ground-glass and nodular ground-glass opacities in the bilateral upper lobes and left lower lobe, likely infectious/inflammatory. 3. New prominent prevascular and left hilar lymph nodes, most likely reactive. Follow-up recommended in this patient with history of cancer. 4. Other small pulmonary nodules are stable. Attention on follow-up studies recommended. 5. Left chest wall fluid collection has resolved. Left axillary fluid collection appears stable. 6. No acute localizing process in the abdomen or pelvis. Electronically Signed   By: Ronney Asters M.D.   On: 05/07/2022 23:26   CT ABDOMEN PELVIS W CONTRAST  Result Date: 05/07/2022 CLINICAL DATA:  Acute abdominal pain. Shortness of breath and cough. Left breast cancer. EXAM: CT ANGIOGRAPHY CHEST CT ABDOMEN AND PELVIS WITH CONTRAST TECHNIQUE: Multidetector CT imaging of the chest was performed using the standard protocol during bolus administration of intravenous contrast. Multiplanar CT image reconstructions and MIPs were obtained to evaluate the vascular anatomy. Multidetector CT imaging of the abdomen and pelvis was performed using the standard protocol during bolus administration of intravenous contrast. RADIATION DOSE REDUCTION: This exam was performed according to the departmental dose-optimization program which includes automated exposure control, adjustment of the mA and/or kV according to patient size and/or  use of iterative reconstruction technique. CONTRAST:  174m OMNIPAQUE IOHEXOL 350 MG/ML SOLN COMPARISON:  CT chest abdomen and pelvis 04/01/2022. FINDINGS: CTA CHEST FINDINGS Cardiovascular: Satisfactory opacification of the pulmonary arteries to the segmental level. No evidence of pulmonary embolism. Normal heart size. No pericardial effusion. Right-sided chest port catheter tip ends at the cavoatrial junction. Mediastinum/Nodes: There are prominent, but nonenlarged prevascular, subcarinal and left hilar lymph nodes, new from prior. The esophagus and visualized thyroid gland are within normal limits. Lungs/Pleura: There are new patchy and slightly nodular ground-glass opacities throughout both upper lobes and minimally in the left lower lobe. Scattered pulmonary nodules measuring 4 mm or less otherwise appear unchanged from prior. There is no pleural effusion or pneumothorax. Trachea and central airways are patent. Musculoskeletal: No acute fractures are visualized. Left mastectomy changes are again seen. There are surgical clips in the left axilla. 1.8 x 2.5 cm fluid collection in the left axilla appears unchanged left chest wall fluid collection has resolved in the interval. Again seen is scoliosis of the thoracic spine. No focal osseous lesions are seen. Review of the MIP images confirms the above findings. CT ABDOMEN and PELVIS FINDINGS Hepatobiliary: No focal liver abnormality is seen. No gallstones, gallbladder wall thickening, or biliary dilatation. Pancreas: Unremarkable. No pancreatic ductal dilatation or surrounding inflammatory changes. Spleen: Normal in size without focal abnormality. Adrenals/Urinary Tract: Adrenal glands are unremarkable. Kidneys are normal, without renal calculi, focal lesion, or hydronephrosis. Bladder is unremarkable. Stomach/Bowel: Stomach is within normal limits. Appendix appears normal. No evidence of bowel wall thickening, distention, or inflammatory changes. There are  scattered colonic diverticula. Vascular/Lymphatic: No significant vascular findings are present. No enlarged abdominal or pelvic lymph nodes. Reproductive: Uterus and bilateral adnexa are unremarkable. Other: No abdominal wall hernia or abnormality. There is a small fat containing umbilical hernia. There is no  ascites. Musculoskeletal: No acute or significant osseous findings. Review of the MIP images confirms the above findings. IMPRESSION: 1. No evidence for pulmonary embolism. 2. New multifocal patchy ground-glass and nodular ground-glass opacities in the bilateral upper lobes and left lower lobe, likely infectious/inflammatory. 3. New prominent prevascular and left hilar lymph nodes, most likely reactive. Follow-up recommended in this patient with history of cancer. 4. Other small pulmonary nodules are stable. Attention on follow-up studies recommended. 5. Left chest wall fluid collection has resolved. Left axillary fluid collection appears stable. 6. No acute localizing process in the abdomen or pelvis. Electronically Signed   By: Ronney Asters M.D.   On: 05/07/2022 23:26   DG Chest Port 1 View  Result Date: 05/07/2022 CLINICAL DATA:  Fever, history of breast cancer EXAM: PORTABLE CHEST 1 VIEW COMPARISON:  CT chest dated 04/01/2022 FINDINGS: Lungs are clear.  No pleural effusion or pneumothorax. The heart is normal in size. Right chest power port terminates at the cavoatrial junction. Surgical clips in the left chest wall/axilla. IMPRESSION: No evidence of acute cardiopulmonary disease. Electronically Signed   By: Julian Hy M.D.   On: 05/07/2022 20:57    Assessment/Plan: 66 y.o.  #1.  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  We held chemotherapy this week due to her admission for pneumonia.  This is likely due in part from the chemotherapy.  We will see her next week I  discussed that we would be reducing the dose of chemotherapy that she receives to avoid another hospitalization.  2.  Pneumonia: She is clinically improving and feeling better.  She can be discharged once the hospitalist team is comfortable with her plan and status.  Dr. Lindi Adie and I discussed the potential interaction between Adriamycin and azithromycin and considering the timing of the antibiotics and the timing of the chemotherapy this should not pose a risk to [facility].  3.  Skin and tongue changes: This is a side effect from the chemotherapy I let her know that it should resolve once treatment has completed.  4.  Anemia: Her hemoglobin is 8.4 today.  This is likely secondary to chemotherapy.  We will continue to monitor it as an outpatient and transfuse her should she become symptomatic or hemoglobin drop below 7.  Shamekia will return next week to follow-up with [place] as outpatient and to resume treatment.  Please reach out to our team if you have any questions about this.  Thank you for your excellent care of this patient.  Time spent: 30 minutes in face to face time with the patient, chart review, lab review, communication with Dr. Lindi Adie.  Scot Dock, NP 05/08/2022  4:43 PM Medical Oncology and H[facility] [address] [place], [place] [address] Tel. [phone]    Fax. [phone]

## 2022-05-08 NOTE — ED Notes (Signed)
Called lab to add on urine tests - they advised they will run them.

## 2022-05-09 ENCOUNTER — Telehealth: Payer: Self-pay | Admitting: *Deleted

## 2022-05-09 DIAGNOSIS — J189 Pneumonia, unspecified organism: Secondary | ICD-10-CM | POA: Diagnosis not present

## 2022-05-09 LAB — BASIC METABOLIC PANEL
Anion gap: 5 (ref 5–15)
BUN: 9 mg/dL (ref 8–23)
CO2: 27 mmol/L (ref 22–32)
Calcium: 8.5 mg/dL — ABNORMAL LOW (ref 8.9–10.3)
Chloride: 105 mmol/L (ref 98–111)
Creatinine, Ser: 0.96 mg/dL (ref 0.44–1.00)
GFR, Estimated: >60 mL/min (ref >60)
Glucose, Bld: 105 mg/dL — ABNORMAL HIGH (ref 70–99)
Potassium: 3.6 mmol/L (ref 3.5–5.1)
Sodium: 137 mmol/L (ref 135–145)

## 2022-05-09 LAB — LEGIONELLA PNEUMOPHILA SEROGP 1 UR AG: L. pneumophila Serogp 1 Ur Ag: NEGATIVE

## 2022-05-09 LAB — URINE CULTURE: Culture: NO GROWTH

## 2022-05-09 LAB — CBC
HCT: 28 % — ABNORMAL LOW (ref 36.0–46.0)
Hemoglobin: 9.1 g/dL — ABNORMAL LOW (ref 12.0–15.0)
MCH: 28.3 pg (ref 26.0–34.0)
MCHC: 32.5 g/dL (ref 30.0–36.0)
MCV: 87 fL (ref 80.0–100.0)
Platelets: 212 10*3/uL (ref 150–400)
RBC: 3.22 MIL/uL — ABNORMAL LOW (ref 3.87–5.11)
RDW: 13.2 % (ref 11.5–15.5)
WBC: 7 10*3/uL (ref 4.0–10.5)
nRBC: 0 % (ref 0.0–0.2)

## 2022-05-09 MED ORDER — SODIUM CHLORIDE 0.9% FLUSH
10.0000 mL | Freq: Two times a day (BID) | INTRAVENOUS | Status: DC
  Administered 2022-05-09: 10 mL

## 2022-05-09 MED ORDER — HEPARIN SOD (PORK) LOCK FLUSH 100 UNIT/ML IV SOLN
500.0000 [IU] | INTRAVENOUS | Status: AC | PRN
  Administered 2022-05-09: 500 [IU]
  Filled 2022-05-09: qty 5

## 2022-05-09 MED ORDER — LEVOFLOXACIN 750 MG PO TABS: 750.0000 mg | ORAL_TABLET | Freq: Every day | ORAL | 0 refills | Status: AC

## 2022-05-09 NOTE — Telephone Encounter (Signed)
"  [place] with [employer] ([employer]) [phone]) calling to check status and turnaround time for disability form and medical records for Dia Crawford, D.O.B. 1956-09-19 faxed 05/05/2022 to [phone] to Dr. Lindi Adie." Advised no receipt of form by forms staff. Provided main fax number [phone] as best fax number to use.   7-10 business days (14 calendar) time frame for return of disability form with records to follow.   Completed forms forwarded to [employer] to process record request.  "Will resend disability request to [phone]." Currently, no further questions or needs.  Awaiting [employer] form to add to FMLA/Disability queue.

## 2022-05-09 NOTE — Discharge Summary (Signed)
Physician Discharge Summary  Madison Becker HAL:[id number] DOB: 03/26/1956 DOA: 05/07/2022  PCP: Pcp, No  Admit date: 05/07/2022 Discharge date: 05/09/2022  Admitted From: Home Disposition: Home  Recommendations for Outpatient Follow-up:  Follow-up with oncology clinic as scheduled  Home Health: N/A Equipment/Devices: N/A  Discharge Condition: Stable CODE STATUS: Full code Diet recommendation: Regular diet  Discharge summary: Breast cancer on chemotherapy with Adriamycin and Cytoxan after surgical resection presented with headache, dry cough, fever and chills for 2 days.  In the emergency room temperature 101.  Tachycardic.  CTA of the chest negative for PE but concerning for new multifocal opacities.  Neutrophil count is normal.  Blood cultures were collected and patient is started on antibiotics with Rocephin and azithromycin.  Patient was admitted to the hospital and treated with antibiotics, cultures were negative.  Clinically improved but he still had low-grade fever.  Patient mostly asymptomatic.  Desire to go home.  With medical stability she was discharged home with 7 days of Levaquin therapy.  Communicated with her oncology.   Discharge Diagnoses:  Principal Problem:   Multifocal pneumonia Active Problems:   Malignant neoplasm of upper-inner quadrant of left breast in female, estrogen receptor positive ([place])   Hilar adenopathy    Discharge Instructions  Discharge Instructions     Call MD for:  difficulty breathing, headache or visual disturbances   Complete by: As directed    Call MD for:  temperature >100.4   Complete by: As directed    Diet - low sodium heart healthy   Complete by: As directed    Increase activity slowly   Complete by: As directed       Allergies as of 05/09/2022       Reactions   Beef-derived Products    Does not eat beef   Pork-derived Products    Does not eat pork        Medication List     TAKE these medications     acetaminophen 500 MG tablet Commonly known as: TYLENOL Take 1,000 mg by mouth every 6 (six) hours as needed for mild pain.   dexamethasone 4 MG tablet Commonly known as: DECADRON Take 1 tablet (4 mg total) by mouth daily. Take daily for 3 days after chemo. Take with food.   levofloxacin 750 MG tablet Commonly known as: Levaquin Take 1 tablet (750 mg total) by mouth daily for 7 days.   lidocaine-prilocaine cream Commonly known as: EMLA Apply to affected area once   loratadine 10 MG tablet Commonly known as: CLARITIN Take 10 mg by mouth daily as needed (for pain after chemo Treatment).   ondansetron 8 MG tablet Commonly known as: Zofran Take 1 tablet (8 mg total) by mouth 2 (two) times daily as needed. Start on the third day after chemotherapy.   prochlorperazine 10 MG tablet Commonly known as: COMPAZINE Take 1 tablet (10 mg total) by mouth every 6 (six) hours as needed (Nausea or vomiting).        Allergies  Allergen Reactions   Beef-Derived Products     Does not eat beef   Pork-Derived Products     Does not eat pork    Consultations: Oncology,   Procedures/Studies: CT Angio Chest PE W and/or Wo Contrast  Result Date: 05/07/2022 CLINICAL DATA:  Acute abdominal pain. Shortness of breath and cough. Left breast cancer. EXAM: CT ANGIOGRAPHY CHEST CT ABDOMEN AND PELVIS WITH CONTRAST TECHNIQUE: Multidetector CT imaging of the chest was performed using the standard protocol during bolus  administration of intravenous contrast. Multiplanar CT image reconstructions and MIPs were obtained to evaluate the vascular anatomy. Multidetector CT imaging of the abdomen and pelvis was performed using the standard protocol during bolus administration of intravenous contrast. RADIATION DOSE REDUCTION: This exam was performed according to the departmental dose-optimization program which includes automated exposure control, adjustment of the mA and/or kV according to patient size and/or use  of iterative reconstruction technique. CONTRAST:  19m OMNIPAQUE IOHEXOL 350 MG/ML SOLN COMPARISON:  CT chest abdomen and pelvis 04/01/2022. FINDINGS: CTA CHEST FINDINGS Cardiovascular: Satisfactory opacification of the pulmonary arteries to the segmental level. No evidence of pulmonary embolism. Normal heart size. No pericardial effusion. Right-sided chest port catheter tip ends at the cavoatrial junction. Mediastinum/Nodes: There are prominent, but nonenlarged prevascular, subcarinal and left hilar lymph nodes, new from prior. The esophagus and visualized thyroid gland are within normal limits. Lungs/Pleura: There are new patchy and slightly nodular ground-glass opacities throughout both upper lobes and minimally in the left lower lobe. Scattered pulmonary nodules measuring 4 mm or less otherwise appear unchanged from prior. There is no pleural effusion or pneumothorax. Trachea and central airways are patent. Musculoskeletal: No acute fractures are visualized. Left mastectomy changes are again seen. There are surgical clips in the left axilla. 1.8 x 2.5 cm fluid collection in the left axilla appears unchanged left chest wall fluid collection has resolved in the interval. Again seen is scoliosis of the thoracic spine. No focal osseous lesions are seen. Review of the MIP images confirms the above findings. CT ABDOMEN and PELVIS FINDINGS Hepatobiliary: No focal liver abnormality is seen. No gallstones, gallbladder wall thickening, or biliary dilatation. Pancreas: Unremarkable. No pancreatic ductal dilatation or surrounding inflammatory changes. Spleen: Normal in size without focal abnormality. Adrenals/Urinary Tract: Adrenal glands are unremarkable. Kidneys are normal, without renal calculi, focal lesion, or hydronephrosis. Bladder is unremarkable. Stomach/Bowel: Stomach is within normal limits. Appendix appears normal. No evidence of bowel wall thickening, distention, or inflammatory changes. There are scattered  colonic diverticula. Vascular/Lymphatic: No significant vascular findings are present. No enlarged abdominal or pelvic lymph nodes. Reproductive: Uterus and bilateral adnexa are unremarkable. Other: No abdominal wall hernia or abnormality. There is a small fat containing umbilical hernia. There is no ascites. Musculoskeletal: No acute or significant osseous findings. Review of the MIP images confirms the above findings. IMPRESSION: 1. No evidence for pulmonary embolism. 2. New multifocal patchy ground-glass and nodular ground-glass opacities in the bilateral upper lobes and left lower lobe, likely infectious/inflammatory. 3. New prominent prevascular and left hilar lymph nodes, most likely reactive. Follow-up recommended in this patient with history of cancer. 4. Other small pulmonary nodules are stable. Attention on follow-up studies recommended. 5. Left chest wall fluid collection has resolved. Left axillary fluid collection appears stable. 6. No acute localizing process in the abdomen or pelvis. Electronically Signed   By: ARonney AstersM.D.   On: 05/07/2022 23:26   CT ABDOMEN PELVIS W CONTRAST  Result Date: 05/07/2022 CLINICAL DATA:  Acute abdominal pain. Shortness of breath and cough. Left breast cancer. EXAM: CT ANGIOGRAPHY CHEST CT ABDOMEN AND PELVIS WITH CONTRAST TECHNIQUE: Multidetector CT imaging of the chest was performed using the standard protocol during bolus administration of intravenous contrast. Multiplanar CT image reconstructions and MIPs were obtained to evaluate the vascular anatomy. Multidetector CT imaging of the abdomen and pelvis was performed using the standard protocol during bolus administration of intravenous contrast. RADIATION DOSE REDUCTION: This exam was performed according to the departmental dose-optimization program which includes automated  exposure control, adjustment of the mA and/or kV according to patient size and/or use of iterative reconstruction technique. CONTRAST:   113m OMNIPAQUE IOHEXOL 350 MG/ML SOLN COMPARISON:  CT chest abdomen and pelvis 04/01/2022. FINDINGS: CTA CHEST FINDINGS Cardiovascular: Satisfactory opacification of the pulmonary arteries to the segmental level. No evidence of pulmonary embolism. Normal heart size. No pericardial effusion. Right-sided chest port catheter tip ends at the cavoatrial junction. Mediastinum/Nodes: There are prominent, but nonenlarged prevascular, subcarinal and left hilar lymph nodes, new from prior. The esophagus and visualized thyroid gland are within normal limits. Lungs/Pleura: There are new patchy and slightly nodular ground-glass opacities throughout both upper lobes and minimally in the left lower lobe. Scattered pulmonary nodules measuring 4 mm or less otherwise appear unchanged from prior. There is no pleural effusion or pneumothorax. Trachea and central airways are patent. Musculoskeletal: No acute fractures are visualized. Left mastectomy changes are again seen. There are surgical clips in the left axilla. 1.8 x 2.5 cm fluid collection in the left axilla appears unchanged left chest wall fluid collection has resolved in the interval. Again seen is scoliosis of the thoracic spine. No focal osseous lesions are seen. Review of the MIP images confirms the above findings. CT ABDOMEN and PELVIS FINDINGS Hepatobiliary: No focal liver abnormality is seen. No gallstones, gallbladder wall thickening, or biliary dilatation. Pancreas: Unremarkable. No pancreatic ductal dilatation or surrounding inflammatory changes. Spleen: Normal in size without focal abnormality. Adrenals/Urinary Tract: Adrenal glands are unremarkable. Kidneys are normal, without renal calculi, focal lesion, or hydronephrosis. Bladder is unremarkable. Stomach/Bowel: Stomach is within normal limits. Appendix appears normal. No evidence of bowel wall thickening, distention, or inflammatory changes. There are scattered colonic diverticula. Vascular/Lymphatic: No  significant vascular findings are present. No enlarged abdominal or pelvic lymph nodes. Reproductive: Uterus and bilateral adnexa are unremarkable. Other: No abdominal wall hernia or abnormality. There is a small fat containing umbilical hernia. There is no ascites. Musculoskeletal: No acute or significant osseous findings. Review of the MIP images confirms the above findings. IMPRESSION: 1. No evidence for pulmonary embolism. 2. New multifocal patchy ground-glass and nodular ground-glass opacities in the bilateral upper lobes and left lower lobe, likely infectious/inflammatory. 3. New prominent prevascular and left hilar lymph nodes, most likely reactive. Follow-up recommended in this patient with history of cancer. 4. Other small pulmonary nodules are stable. Attention on follow-up studies recommended. 5. Left chest wall fluid collection has resolved. Left axillary fluid collection appears stable. 6. No acute localizing process in the abdomen or pelvis. Electronically Signed   By: ARonney AstersM.D.   On: 05/07/2022 23:26   DG Chest Port 1 View  Result Date: 05/07/2022 CLINICAL DATA:  Fever, history of breast cancer EXAM: PORTABLE CHEST 1 VIEW COMPARISON:  CT chest dated 04/01/2022 FINDINGS: Lungs are clear.  No pleural effusion or pneumothorax. The heart is normal in size. Right chest power port terminates at the cavoatrial junction. Surgical clips in the left chest wall/axilla. IMPRESSION: No evidence of acute cardiopulmonary disease. Electronically Signed   By: SJulian HyM.D.   On: 05/07/2022 20:57   (Echo, Carotid, EGD, Colonoscopy, ERCP)    Subjective: Patient seen and examined.  Low-grade fever overnight but overall she feels better.  She wants to go home.  Mobilized around.  Without any shortness of breath.  Has some dry cough.   Discharge Exam: Vitals:   05/09/22 0120 05/09/22 0221  BP: (!) 147/81   Pulse: 80   Resp: 15   Temp: (!) 101.2 F (  38.4 C) 98.7 F (37.1 C)  SpO2: 99%     Vitals:   05/08/22 1711 05/08/22 2201 05/09/22 0120 05/09/22 0221  BP: 135/80 121/67 (!) 147/81   Pulse: 89 86 80   Resp: '18 15 15   '$ Temp: 100.2 F (37.9 C) 98.9 F (37.2 C) (!) 101.2 F (38.4 C) 98.7 F (37.1 C)  TempSrc: Oral Oral Oral Oral  SpO2: 100% 100% 99%   Weight:      Height:        General: Pt is alert, awake, not in acute distress Cardiovascular: RRR, S1/S2 +, no rubs, no gallops Port present right chest wall. Respiratory: CTA bilaterally, no wheezing, no rhonchi Abdominal: Soft, NT, ND, bowel sounds + Extremities: no edema, no cyanosis    The results of significant diagnostics from this hospitalization (including imaging, microbiology, ancillary and laboratory) are listed below for reference.     Microbiology: Recent Results (from the past 240 hour(s))  Blood Culture (routine x 2)     Status: None (Preliminary result)   Collection Time: 05/07/22  9:35 PM   Specimen: BLOOD  Result Value Ref Range Status   Specimen Description   Final    BLOOD LEFT ANTECUBITAL Performed at [facility] [address] [place], [place] [address]    Special Requests   Final    BOTTLES DRAWN AEROBIC AND ANAEROBIC Blood Culture results may not be optimal due to an excessive volume of blood received in culture bottles Performed at [facility] [address], [place], [place] [address]    Culture   Final    NO GROWTH 2 DAYS Performed at [facility] [address], [place], [place] [address]    Report Status PENDING  Incomplete  Blood Culture (routine x 2)     Status: None (Preliminary result)   Collection Time: 05/07/22  9:50 PM   Specimen: BLOOD  Result Value Ref Range Status   Specimen Description   Final    BLOOD RIGHT ANTECUBITAL Performed at [facility] [address] [place], [place] [address]    Special Requests   Final    BOTTLES DRAWN AEROBIC AND ANAEROBIC Blood Culture results may not be  optimal due to an excessive volume of blood received in culture bottles Performed at [facility] [address], [place], [place] [address]    Culture   Final    NO GROWTH 2 DAYS Performed at [facility] [address], [place], [place] [address]    Report Status PENDING  Incomplete  Resp Panel by RT-PCR (Flu A&B, Covid) Nasopharyngeal Swab     Status: None   Collection Time: 05/07/22 10:47 PM   Specimen: Nasopharyngeal Swab; Nasopharyngeal(NP) swabs in vial transport medium  Result Value Ref Range Status   SARS Coronavirus 2 by RT PCR NEGATIVE NEGATIVE Final    Comment: (NOTE) SARS-CoV-2 target nucleic acids are NOT DETECTED.  The SARS-CoV-2 RNA is generally detectable in upper respiratory specimens during the acute phase of infection. The lowest concentration of SARS-CoV-2 viral copies this assay can detect is 138 copies/mL. A negative result does not preclude SARS-Cov-2 infection and should not be used as the sole basis for treatment or other patient management decisions. A negative result may occur with  improper specimen collection/handling, submission of specimen other than nasopharyngeal swab, presence of viral mutation(s) within the areas targeted by this assay, and inadequate number of viral copies(<138 copies/mL). A negative  result must be combined with clinical observations, patient history, and epidemiological information. The expected result is Negative.  Fact Sheet for Patients:  EntrepreneurPulse.com.au  Fact Sheet for Healthcare Providers:  IncredibleEmployment.be  This test is no t yet approved or cleared by the [place] FDA and  has been authorized for detection and/or diagnosis of SARS-CoV-2 by FDA under an Emergency Use Authorization (EUA). This EUA will remain  in effect (meaning this test can be used) for the duration of the COVID-19 declaration under Section 564(b)(1) of the Act,  21 U.S.C.section 360bbb-3(b)(1), unless the authorization is terminated  or revoked sooner.       Influenza A by PCR NEGATIVE NEGATIVE Final   Influenza B by PCR NEGATIVE NEGATIVE Final    Comment: (NOTE) The Xpert Xpress SARS-CoV-2/FLU/RSV plus assay is intended as an aid in the diagnosis of influenza from Nasopharyngeal swab specimens and should not be used as a sole basis for treatment. Nasal washings and aspirates are unacceptable for Xpert Xpress SARS-CoV-2/FLU/RSV testing.  Fact Sheet for Patients: EntrepreneurPulse.com.au  Fact Sheet for Healthcare Providers: IncredibleEmployment.be  This test is not yet approved or cleared by the [place] FDA and has been authorized for detection and/or diagnosis of SARS-CoV-2 by FDA under an Emergency Use Authorization (EUA). This EUA will remain in effect (meaning this test can be used) for the duration of the COVID-19 declaration under Section 564(b)(1) of the Act, 21 U.S.C. section 360bbb-3(b)(1), unless the authorization is terminated or revoked.  Performed at [facility], [place] [address] [place], [place] [address]   Urine Culture     Status: None   Collection Time: 05/08/22  1:30 AM   Specimen: In/Out Cath Urine  Result Value Ref Range Status   Specimen Description   Final    IN/OUT CATH URINE Performed at [facility] [address], [place], [place] [address]    Special Requests   Final    NONE Performed at [facility], [place] [address], [place], [place] [address]    Culture   Final    NO GROWTH Performed at [facility], [place] [address], [place], [place] [address]    Report Status 05/09/2022 FINAL  Final     Labs: BNP (last 3 results) No results for input(s): BNP in the last 8760 hours. Basic Metabolic Panel: Recent Labs  Lab 05/07/22 2150 05/08/22 0430 05/09/22 0603  NA 138 138 137  K 3.8 3.8 3.6  CL  107 107 105  CO2 '25 26 27  '$ GLUCOSE 110* 102* 105*  BUN 8 7* 9  CREATININE 0.98 0.91 0.96  CALCIUM 8.6* 8.1* 8.5*   Liver Function Tests: Recent Labs  Lab 05/07/22 2150  AST 14*  ALT 16  ALKPHOS 76  BILITOT 0.6  PROT 6.7  ALBUMIN 3.4*   Recent Labs  Lab 05/07/22 2150  LIPASE 25   No results for input(s): AMMONIA in the last 168 hours. CBC: Recent Labs  Lab 05/07/22 2150 05/08/22 0430 05/09/22 0603  WBC 7.9 7.2 7.0  NEUTROABS 5.7  --   --   HGB 9.6* 8.4* 9.1*  HCT 29.0* 25.6* 28.0*  MCV 86.6 87.4 87.0  PLT 155 154 212   Cardiac Enzymes: No results for input(s): CKTOTAL, CKMB, CKMBINDEX, TROPONINI in the last 168 hours. BNP: Invalid input(s): POCBNP CBG: No results for input(s): GLUCAP in the last 168 hours. D-Dimer No results for input(s): DDIMER in the last 72 hours. Hgb A1c No  results for input(s): HGBA1C in the last 72 hours. Lipid Profile No results for input(s): CHOL, HDL, LDLCALC, TRIG, CHOLHDL, LDLDIRECT in the last 72 hours. Thyroid function studies No results for input(s): TSH, T4TOTAL, T3FREE, THYROIDAB in the last 72 hours.  Invalid input(s): FREET3 Anemia work up No results for input(s): VITAMINB12, FOLATE, FERRITIN, TIBC, IRON, RETICCTPCT in the last 72 hours. Urinalysis    Component Value Date/Time   COLORURINE STRAW (A) 05/08/2022 0130   APPEARANCEUR CLEAR 05/08/2022 0130   LABSPEC 1.028 05/08/2022 0130   PHURINE 5.0 05/08/2022 0130   GLUCOSEU NEGATIVE 05/08/2022 0130   HGBUR NEGATIVE 05/08/2022 0130   BILIRUBINUR NEGATIVE 05/08/2022 0130   KETONESUR NEGATIVE 05/08/2022 0130   PROTEINUR NEGATIVE 05/08/2022 0130   NITRITE NEGATIVE 05/08/2022 0130   LEUKOCYTESUR NEGATIVE 05/08/2022 0130   Sepsis Labs Invalid input(s): PROCALCITONIN,  WBC,  LACTICIDVEN Microbiology Recent Results (from the past 240 hour(s))  Blood Culture (routine x 2)     Status: None (Preliminary result)   Collection Time: 05/07/22  9:35 PM   Specimen: BLOOD   Result Value Ref Range Status   Specimen Description   Final    BLOOD LEFT ANTECUBITAL Performed at [facility], [place] [address] [place], [place] [address]    Special Requests   Final    BOTTLES DRAWN AEROBIC AND ANAEROBIC Blood Culture results may not be optimal due to an excessive volume of blood received in culture bottles Performed at [facility] [address] [place], [place] [address]    Culture   Final    NO GROWTH 2 DAYS Performed at [facility] [address] [place], [place] [address]    Report Status PENDING  Incomplete  Blood Culture (routine x 2)     Status: None (Preliminary result)   Collection Time: 05/07/22  9:50 PM   Specimen: BLOOD  Result Value Ref Range Status   Specimen Description   Final    BLOOD RIGHT ANTECUBITAL Performed at [facility] [address] [place], [place] [address]    Special Requests   Final    BOTTLES DRAWN AEROBIC AND ANAEROBIC Blood Culture results may not be optimal due to an excessive volume of blood received in culture bottles Performed at [facility] [address] [place], [place] [address]    Culture   Final    NO GROWTH 2 DAYS Performed at [facility] [address] [place], [place] [address]    Report Status PENDING  Incomplete  Resp Panel by RT-PCR (Flu A&B, Covid) Nasopharyngeal Swab     Status: None   Collection Time: 05/07/22 10:47 PM   Specimen: Nasopharyngeal Swab; Nasopharyngeal(NP) swabs in vial transport medium  Result Value Ref Range Status   SARS Coronavirus 2 by RT PCR NEGATIVE NEGATIVE Final    Comment: (NOTE) SARS-CoV-2 target nucleic acids are NOT DETECTED.  The SARS-CoV-2 RNA is generally detectable in upper respiratory specimens during the acute phase of infection. The lowest concentration of SARS-CoV-2 viral copies this assay can detect is 138 copies/mL. A negative result does not preclude  SARS-Cov-2 infection and should not be used as the sole basis for treatment or other patient management decisions. A negative result may occur with  improper specimen collection/handling, submission of specimen other than nasopharyngeal swab, presence of viral mutation(s) within the areas targeted by this assay, and inadequate number of viral copies(<138 copies/mL). A negative result must be combined with clinical  observations, patient history, and epidemiological information. The expected result is Negative.  Fact Sheet for Patients:  EntrepreneurPulse.com.au  Fact Sheet for Healthcare Providers:  IncredibleEmployment.be  This test is no t yet approved or cleared by the [place] FDA and  has been authorized for detection and/or diagnosis of SARS-CoV-2 by FDA under an Emergency Use Authorization (EUA). This EUA will remain  in effect (meaning this test can be used) for the duration of the COVID-19 declaration under Section 564(b)(1) of the Act, 21 U.S.C.section 360bbb-3(b)(1), unless the authorization is terminated  or revoked sooner.       Influenza A by PCR NEGATIVE NEGATIVE Final   Influenza B by PCR NEGATIVE NEGATIVE Final    Comment: (NOTE) The Xpert Xpress SARS-CoV-2/FLU/RSV plus assay is intended as an aid in the diagnosis of influenza from Nasopharyngeal swab specimens and should not be used as a sole basis for treatment. Nasal washings and aspirates are unacceptable for Xpert Xpress SARS-CoV-2/FLU/RSV testing.  Fact Sheet for Patients: EntrepreneurPulse.com.au  Fact Sheet for Healthcare Providers: IncredibleEmployment.be  This test is not yet approved or cleared by the [place] FDA and has been authorized for detection and/or diagnosis of SARS-CoV-2 by FDA under an Emergency Use Authorization (EUA). This EUA will remain in effect (meaning this test can be used) for the duration of  the COVID-19 declaration under Section 564(b)(1) of the Act, 21 U.S.C. section 360bbb-3(b)(1), unless the authorization is terminated or revoked.  Performed at [facility], [place] [address] [place], [place] [address]   Urine Culture     Status: None   Collection Time: 05/08/22  1:30 AM   Specimen: In/Out Cath Urine  Result Value Ref Range Status   Specimen Description   Final    IN/OUT CATH URINE Performed at [facility] [address] [place], [place] [address]    Special Requests   Final    NONE Performed at [facility], [place] [address], [place], [place] [address]    Culture   Final    NO GROWTH Performed at [facility], [place] [address], [place], [place] [address]    Report Status 05/09/2022 FINAL  Final     Time coordinating discharge: 35 minutes  SIGNED:   Barb Merino, MD  [facility] 05/09/2022, 1:00 PM

## 2022-05-10 ENCOUNTER — Inpatient Hospital Stay: Payer: BC Managed Care – PPO

## 2022-05-12 LAB — CULTURE, BLOOD (ROUTINE X 2)
Culture: NO GROWTH
Culture: NO GROWTH

## 2022-05-12 NOTE — Progress Notes (Signed)
Patient Care Team: Pcp, No as PCP - General [name], RN as Oncology Nurse Navigator [name], RN as Oncology Nurse Navigator [name], MD as Consulting Physician (Hematology and Oncology)  DIAGNOSIS:  Encounter Diagnosis  Name Primary?   Malignant neoplasm of upper-inner quadrant of left breast in female, estrogen receptor positive ([place])     SUMMARY OF ONCOLOGIC HISTORY: Oncology History  Malignant neoplasm of upper-inner quadrant of left breast in female, estrogen receptor positive ([place])  10/22/2021 Initial Diagnosis   Palpable left breast mass. Diagnostic mammogram and [place]: an irregular mass within the upper inner left breast, IDC 2.2 cm ER 99%, PR 99%, HER2 negative, Ki-67 50%.  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  Total area of involvement 6 cm   10/22/2021 Cancer Staging   Staging form: Breast, AJCC 8th Edition - Clinical stage from 10/22/2021: Stage IIA (cT2, cN0(f), cM0, G3, ER+, PR+, HER2-) - Signed by [name], MD on 01/02/2022 Stage prefix: Initial diagnosis Method of lymph node assessment: Core biopsy Histologic grading system: 3 grade system    11/04/2021 Breast MRI   2.5 cm irregular enhancing mass UIQ left breast IDC with DCIS.  Additional non-mass enhancement extends anterior and inferior to the mass 2.5 cm total span 7.1 cm.   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, focal high-grade DCIS, margins negative, 1/2 lymph nodes positive ER 99%, PR 99%, HER2 negative, Ki-67 50%   03/24/2022 Oncotype testing   Oncotype DX recurrence score: 30 (distant recurrence at 9 years with hormone therapy alone: 23%   04/10/2022 -  Chemotherapy   Patient is on Treatment Plan : BREAST ADJUVANT DOSE DENSE AC q14d / PACLitaxel q7d        CHIEF COMPLIANT: Left breast cancer on Adriamycin and Cytoxan, today cycle 3    INTERVAL HISTORY: Madison Becker is a  66 y.o. with the above mentioned h/o  Left breast cancer. She presents to the clinic today for treatment and follow-up.  She is currently on adjuvant chemotherapy and received second cycle of Adriamycin and Cytoxan but had to be in the hospital for couple of days with suspicion for bilateral upper lung pneumonia.  She was treated with IV antibiotics and was sent home on Levaquin.  She completed her antibiotics and she feels fairly well today to receive her next treatment.  During the last couple of weeks she had changes in her taste and appetite.  Denied any nausea or vomiting.  She was complaining of discoloration of her palms and nailbeds.   ALLERGIES:  is allergic to beef-derived products and pork-derived products.  MEDICATIONS:  Current Outpatient Medications  Medication Sig Dispense Refill   acetaminophen (TYLENOL) 500 MG tablet Take 1,000 mg by mouth every 6 (six) hours as needed for mild pain.     dexamethasone (DECADRON) 4 MG tablet Take 1 tablet (4 mg total) by mouth daily. Take daily for 3 days after chemo. Take with food. 8 tablet 0   lidocaine-prilocaine (EMLA) cream Apply to affected area once 30 g 3   loratadine (CLARITIN) 10 MG tablet Take 10 mg by mouth daily as needed (for pain after chemo Treatment).     ondansetron (ZOFRAN) 8 MG tablet Take 1 tablet (8 mg total) by mouth 2 (two) times daily as needed. Start on the third day after chemotherapy. 30 tablet 1   prochlorperazine (COMPAZINE) 10  MG tablet Take 1 tablet (10 mg total) by mouth every 6 (six) hours as needed (Nausea or vomiting). 30 tablet 1   No current facility-administered medications for this visit.    PHYSICAL EXAMINATION: ECOG PERFORMANCE STATUS: 1 - Symptomatic but completely ambulatory  Vitals:   05/21/22 0936  BP: (!) 157/79  Pulse: 74  Resp: 18  Temp: (!) 97.3 F (36.3 C)  SpO2: 100%   Filed Weights   05/21/22 0936  Weight: 213 lb 12.8 oz (97 kg)       LABORATORY DATA:  I have reviewed the data  as listed    Latest Ref Rng & Units 05/09/2022    6:03 AM 05/08/2022    4:30 AM 05/07/2022    9:50 PM  CMP  Glucose 70 - 99 mg/dL 105   102   110    BUN 8 - 23 mg/dL _0 Creatinine 0.44 - 1.00 mg/dL 0.96   0.91   0.98    Sodium 135 - 145 mmol/L 137   138   138    Potassium 3.5 - 5.1 mmol/L 3.6   3.8   3.8    Chloride 98 - 111 mmol/L 105   107   107    CO2 22 - 32 mmol/L _1 Calcium 8.9 - 10.3 mg/dL 8.5   8.1   8.6    Total Protein 6.5 - 8.1 g/dL   6.7    Total Bilirubin 0.3 - 1.2 mg/dL   0.6    Alkaline Phos 38 - 126 U/L   76    AST 15 - 41 U/L   14    ALT 0 - 44 U/L   16      Lab Results  Component Value Date   WBC 3.8 (L) 05/21/2022   HGB 9.2 (L) 05/21/2022   HCT 27.5 (L) 05/21/2022   MCV 85.1 05/21/2022   PLT 281 05/21/2022   NEUTROABS 2.2 05/21/2022    ASSESSMENT & PLAN:  Malignant neoplasm of upper-inner quadrant of left breast in female, estrogen receptor positive ([place]) 03/05/2022:Left mastectomy: 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  Adjuvant chemotherapy with dose dense Adriamycin and Cytoxan x4 followed by Taxol x12 2. adjuvant antiestrogen therapy ----------------------------------------------------------------------------------------------------------------------------------------- Current treatment: Cycle 3 dose dense Adriamycin and Cytoxan Chemotoxicities: Hair loss. Anemia: Patient started at a baseline hemoglobin of 10.7 and today is 9.8. Thrombocytopenia: Monitoring today it is 146 Hospitalization 05/07/2022-05/09/2022 (fever, tachycardia, CT chest negative for PE but showed bilateral upper lung infiltrates treated with Rocephin and azithromycin discharged home on Levaquin) Skin discoloration of her hands and nailbed  discoloration as well  I recommended that we reduce the dosage of chemotherapy but we will proceed with her treatment today.    No orders of the defined types were placed in this encounter.  The patient has a good understanding of the overall plan. she agrees with it. she will call with any problems that may develop before the next visit here. Total time spent: 30 mins including face to face time and time spent for planning, charting and co-ordination of care   [name], MD 05/21/22    I Gardiner Coins am scribing for Dr.  Kambria Grima  I have reviewed the above documentation for accuracy and completeness, and I agree with the above.

## 2022-05-21 ENCOUNTER — Encounter: Payer: Self-pay | Admitting: *Deleted

## 2022-05-21 ENCOUNTER — Inpatient Hospital Stay: Payer: BC Managed Care – PPO

## 2022-05-21 ENCOUNTER — Inpatient Hospital Stay (HOSPITAL_BASED_OUTPATIENT_CLINIC_OR_DEPARTMENT_OTHER): Payer: BC Managed Care – PPO | Admitting: Hematology and Oncology

## 2022-05-21 ENCOUNTER — Other Ambulatory Visit: Payer: Self-pay

## 2022-05-21 DIAGNOSIS — Z170 Estrogen receptor positive status [ER+]: Secondary | ICD-10-CM

## 2022-05-21 DIAGNOSIS — Z95828 Presence of other vascular implants and grafts: Secondary | ICD-10-CM

## 2022-05-21 DIAGNOSIS — C50212 Malignant neoplasm of upper-inner quadrant of left female breast: Secondary | ICD-10-CM | POA: Diagnosis present

## 2022-05-21 DIAGNOSIS — Z5189 Encounter for other specified aftercare: Secondary | ICD-10-CM | POA: Diagnosis not present

## 2022-05-21 DIAGNOSIS — Z5111 Encounter for antineoplastic chemotherapy: Secondary | ICD-10-CM | POA: Diagnosis present

## 2022-05-21 LAB — CBC WITH DIFFERENTIAL/PLATELET
Abs Immature Granulocytes: 0.01 10*3/uL (ref 0.00–0.07)
Basophils Absolute: 0.1 10*3/uL (ref 0.0–0.1)
Basophils Relative: 1 %
Eosinophils Absolute: 0.2 10*3/uL (ref 0.0–0.5)
Eosinophils Relative: 5 %
HCT: 27.5 % — ABNORMAL LOW (ref 36.0–46.0)
Hemoglobin: 9.2 g/dL — ABNORMAL LOW (ref 12.0–15.0)
Immature Granulocytes: 0 %
Lymphocytes Relative: 17 %
Lymphs Abs: 0.6 10*3/uL — ABNORMAL LOW (ref 0.7–4.0)
MCH: 28.5 pg (ref 26.0–34.0)
MCHC: 33.5 g/dL (ref 30.0–36.0)
MCV: 85.1 fL (ref 80.0–100.0)
Monocytes Absolute: 0.7 10*3/uL (ref 0.1–1.0)
Monocytes Relative: 18 %
Neutro Abs: 2.2 10*3/uL (ref 1.7–7.7)
Neutrophils Relative %: 59 %
Platelets: 281 10*3/uL (ref 150–400)
RBC: 3.23 MIL/uL — ABNORMAL LOW (ref 3.87–5.11)
RDW: 14.6 % (ref 11.5–15.5)
WBC: 3.8 10*3/uL — ABNORMAL LOW (ref 4.0–10.5)
nRBC: 0 % (ref 0.0–0.2)

## 2022-05-21 LAB — COMPREHENSIVE METABOLIC PANEL
ALT: 12 U/L (ref 0–44)
AST: 15 U/L (ref 15–41)
Albumin: 3.4 g/dL — ABNORMAL LOW (ref 3.5–5.0)
Alkaline Phosphatase: 58 U/L (ref 38–126)
Anion gap: 6 (ref 5–15)
BUN: 14 mg/dL (ref 8–23)
CO2: 26 mmol/L (ref 22–32)
Calcium: 9 mg/dL (ref 8.9–10.3)
Chloride: 108 mmol/L (ref 98–111)
Creatinine, Ser: 0.94 mg/dL (ref 0.44–1.00)
GFR, Estimated: >60 mL/min (ref >60)
Glucose, Bld: 148 mg/dL — ABNORMAL HIGH (ref 70–99)
Potassium: 3.7 mmol/L (ref 3.5–5.1)
Sodium: 140 mmol/L (ref 135–145)
Total Bilirubin: 0.4 mg/dL (ref 0.3–1.2)
Total Protein: 6.3 g/dL — ABNORMAL LOW (ref 6.5–8.1)

## 2022-05-21 MED ORDER — DOXORUBICIN HCL CHEMO IV INJECTION 2 MG/ML
50.0000 mg/m2 | Freq: Once | INTRAVENOUS | Status: AC
  Administered 2022-05-21: 106 mg via INTRAVENOUS
  Filled 2022-05-21: qty 53

## 2022-05-21 MED ORDER — PALONOSETRON HCL INJECTION 0.25 MG/5ML
0.2500 mg | Freq: Once | INTRAVENOUS | Status: AC
  Administered 2022-05-21: 0.25 mg via INTRAVENOUS
  Filled 2022-05-21: qty 5

## 2022-05-21 MED ORDER — SODIUM CHLORIDE 0.9 % IV SOLN
10.0000 mg | Freq: Once | INTRAVENOUS | Status: AC
  Administered 2022-05-21: 10 mg via INTRAVENOUS
  Filled 2022-05-21: qty 10

## 2022-05-21 MED ORDER — SODIUM CHLORIDE 0.9 % IV SOLN: Freq: Once | INTRAVENOUS | Status: AC

## 2022-05-21 MED ORDER — SODIUM CHLORIDE 0.9 % IV SOLN
150.0000 mg | Freq: Once | INTRAVENOUS | Status: AC
  Administered 2022-05-21: 150 mg via INTRAVENOUS
  Filled 2022-05-21: qty 150

## 2022-05-21 MED ORDER — SODIUM CHLORIDE 0.9% FLUSH
10.0000 mL | INTRAVENOUS | Status: DC | PRN
  Administered 2022-05-21: 10 mL

## 2022-05-21 MED ORDER — SODIUM CHLORIDE 0.9 % IV SOLN
500.0000 mg/m2 | Freq: Once | INTRAVENOUS | Status: AC
  Administered 2022-05-21: 1060 mg via INTRAVENOUS
  Filled 2022-05-21: qty 53

## 2022-05-21 MED ORDER — SODIUM CHLORIDE 0.9% FLUSH
10.0000 mL | Freq: Once | INTRAVENOUS | Status: AC
  Administered 2022-05-21: 10 mL

## 2022-05-21 NOTE — Assessment & Plan Note (Addendum)
03/05/2022:Left mastectomy: Grade 3 IDC 2.7 cm, focal high-grade DCIS, margins negative, 1/2 lymph nodes positive ER 99%, PR 99%, HER2 negative, Ki-67 50% 03/24/2022:Oncotype DX recurrence score: 30 (distant recurrence at 9 years with hormone therapy alone: 23%  Treatment plan: 1.Adjuvant chemotherapy with dose dense Adriamycin and Cytoxan x4 followed by Taxol x12 2.adjuvant antiestrogen therapy ----------------------------------------------------------------------------------------------------------------------------------------- Current treatment: Cycle 3dose dense Adriamycin and Cytoxan Chemotoxicities: 1. Hair loss. 2. Anemia: Patient started at a baseline hemoglobin of 10.7 and today is 9.8. 3. Thrombocytopenia: Monitoring today it is 146 4. Hospitalization 05/07/2022-05/09/2022 (fever, tachycardia, CT chest negative for PE but showed bilateral upper lung infiltrates treated with Rocephin and azithromycin discharged home on Levaquin) 5. Skin discoloration of her hands and nailbed discoloration as well  I recommended that we reduce the dosage of chemotherapy but we will proceed with her treatment today.

## 2022-05-21 NOTE — Patient Instructions (Signed)
[facility] ONCOLOGY   Discharge Instructions: Thank you for choosing [facility] to provide your oncology and hematology care.   If you have a lab appointment with the [facility], please go directly to the [facility] and check in at the registration area.   Wear comfortable clothing and clothing appropriate for easy access to any Portacath or PICC line.   We strive to give you quality time with your provider. You may need to reschedule your appointment if you arrive late (15 or more minutes).  Arriving late affects you and other patients whose appointments are after yours.  Also, if you miss three or more appointments without notifying the office, you may be dismissed from the clinic at the provider's discretion.      For prescription refill requests, have your pharmacy contact our office and allow 72 hours for refills to be completed.    Today you received the following chemotherapy and/or immunotherapy agents: cyclophosphamide and doxorubicin      To help prevent nausea and vomiting after your treatment, we encourage you to take your nausea medication as directed.  BELOW ARE SYMPTOMS THAT SHOULD BE REPORTED IMMEDIATELY: *FEVER GREATER THAN 100.4 F (38 C) OR HIGHER *CHILLS OR SWEATING *NAUSEA AND VOMITING THAT IS NOT CONTROLLED WITH YOUR NAUSEA MEDICATION *UNUSUAL SHORTNESS OF BREATH *UNUSUAL BRUISING OR BLEEDING *URINARY PROBLEMS (pain or burning when urinating, or frequent urination) *BOWEL PROBLEMS (unusual diarrhea, constipation, pain near the anus) TENDERNESS IN MOUTH AND THROAT WITH OR WITHOUT PRESENCE OF ULCERS (sore throat, sores in mouth, or a toothache) UNUSUAL RASH, SWELLING OR PAIN  UNUSUAL VAGINAL DISCHARGE OR ITCHING   Items with * indicate a potential emergency and should be followed up as soon as possible or go to the Emergency Department if any problems should occur.  Please show the CHEMOTHERAPY ALERT CARD or IMMUNOTHERAPY  ALERT CARD at check-in to the Emergency Department and triage nurse.  Should you have questions after your visit or need to cancel or reschedule your appointment, please contact [facility]  Dept: [phone]  and follow the prompts.  Office hours are 8:00 a.m. to 4:30 p.m. Monday - Friday. Please note that voicemails left after 4:00 p.m. may not be returned until the following business day.  We are closed weekends and major holidays. You have access to a nurse at all times for urgent questions. Please call the main number to the clinic Dept: [phone] and follow the prompts.   For any non-urgent questions, you may also contact your provider using MyChart. We now offer e-Visits for anyone 64 and older to request care online for non-urgent symptoms. For details visit [email or web address].   Also download the MyChart app! Go to the app store, search "MyChart", open the app, select [facility], and log in with your MyChart username and password.  Due to Covid, a mask is required upon entering the hospital/clinic. If you do not have a mask, one will be given to you upon arrival. For doctor visits, patients may have 1 support person aged 64 or older with them. For treatment visits, patients cannot have anyone with them due to current Covid guidelines and our immunocompromised population.

## 2022-05-22 ENCOUNTER — Telehealth: Payer: Self-pay | Admitting: Hematology and Oncology

## 2022-05-22 NOTE — Telephone Encounter (Signed)
Scheduled appointment per 05/31 los. Patient aware.

## 2022-05-23 ENCOUNTER — Inpatient Hospital Stay: Payer: BC Managed Care – PPO | Attending: Hematology and Oncology

## 2022-05-23 ENCOUNTER — Other Ambulatory Visit: Payer: Self-pay

## 2022-05-23 VITALS — BP 122/62 | HR 66 | Temp 98.4°F | Resp 18

## 2022-05-23 DIAGNOSIS — C50212 Malignant neoplasm of upper-inner quadrant of left female breast: Secondary | ICD-10-CM | POA: Diagnosis present

## 2022-05-23 DIAGNOSIS — Z5189 Encounter for other specified aftercare: Secondary | ICD-10-CM | POA: Insufficient documentation

## 2022-05-23 DIAGNOSIS — Z5111 Encounter for antineoplastic chemotherapy: Secondary | ICD-10-CM | POA: Insufficient documentation

## 2022-05-23 DIAGNOSIS — Z79899 Other long term (current) drug therapy: Secondary | ICD-10-CM | POA: Diagnosis not present

## 2022-05-23 MED ORDER — PEGFILGRASTIM-CBQV 6 MG/0.6ML ~~LOC~~ SOSY
6.0000 mg | PREFILLED_SYRINGE | Freq: Once | SUBCUTANEOUS | Status: AC
  Administered 2022-05-23: 6 mg via SUBCUTANEOUS
  Filled 2022-05-23: qty 0.6

## 2022-05-26 ENCOUNTER — Encounter: Payer: Self-pay | Admitting: *Deleted

## 2022-06-03 MED FILL — Dexamethasone Sodium Phosphate Inj 100 MG/10ML: INTRAMUSCULAR | Qty: 1 | Status: AC

## 2022-06-03 MED FILL — Fosaprepitant Dimeglumine For IV Infusion 150 MG (Base Eq): INTRAVENOUS | Qty: 5 | Status: AC

## 2022-06-04 ENCOUNTER — Telehealth: Payer: Self-pay | Admitting: *Deleted

## 2022-06-04 ENCOUNTER — Inpatient Hospital Stay: Payer: BC Managed Care – PPO

## 2022-06-04 ENCOUNTER — Inpatient Hospital Stay (HOSPITAL_BASED_OUTPATIENT_CLINIC_OR_DEPARTMENT_OTHER): Payer: BC Managed Care – PPO | Admitting: Hematology and Oncology

## 2022-06-04 ENCOUNTER — Other Ambulatory Visit: Payer: Self-pay

## 2022-06-04 DIAGNOSIS — Z170 Estrogen receptor positive status [ER+]: Secondary | ICD-10-CM

## 2022-06-04 DIAGNOSIS — C50212 Malignant neoplasm of upper-inner quadrant of left female breast: Secondary | ICD-10-CM | POA: Diagnosis not present

## 2022-06-04 DIAGNOSIS — Z5111 Encounter for antineoplastic chemotherapy: Secondary | ICD-10-CM | POA: Diagnosis not present

## 2022-06-04 DIAGNOSIS — Z95828 Presence of other vascular implants and grafts: Secondary | ICD-10-CM

## 2022-06-04 LAB — CBC WITH DIFFERENTIAL (CANCER CENTER ONLY)
Abs Immature Granulocytes: 0.08 10*3/uL — ABNORMAL HIGH (ref 0.00–0.07)
Basophils Absolute: 0 10*3/uL (ref 0.0–0.1)
Basophils Relative: 1 %
Eosinophils Absolute: 0.1 10*3/uL (ref 0.0–0.5)
Eosinophils Relative: 3 %
HCT: 27.1 % — ABNORMAL LOW (ref 36.0–46.0)
Hemoglobin: 8.9 g/dL — ABNORMAL LOW (ref 12.0–15.0)
Immature Granulocytes: 2 %
Lymphocytes Relative: 12 %
Lymphs Abs: 0.5 10*3/uL — ABNORMAL LOW (ref 0.7–4.0)
MCH: 28.4 pg (ref 26.0–34.0)
MCHC: 32.8 g/dL (ref 30.0–36.0)
MCV: 86.6 fL (ref 80.0–100.0)
Monocytes Absolute: 0.8 10*3/uL (ref 0.1–1.0)
Monocytes Relative: 19 %
Neutro Abs: 2.9 10*3/uL (ref 1.7–7.7)
Neutrophils Relative %: 63 %
Platelet Count: 116 10*3/uL — ABNORMAL LOW (ref 150–400)
RBC: 3.13 MIL/uL — ABNORMAL LOW (ref 3.87–5.11)
RDW: 15.6 % — ABNORMAL HIGH (ref 11.5–15.5)
WBC Count: 4.4 10*3/uL (ref 4.0–10.5)
nRBC: 0 % (ref 0.0–0.2)

## 2022-06-04 LAB — CMP (CANCER CENTER ONLY)
ALT: 18 U/L (ref 0–44)
AST: 17 U/L (ref 15–41)
Albumin: 3.7 g/dL (ref 3.5–5.0)
Alkaline Phosphatase: 75 U/L (ref 38–126)
Anion gap: 6 (ref 5–15)
BUN: 12 mg/dL (ref 8–23)
CO2: 26 mmol/L (ref 22–32)
Calcium: 9.1 mg/dL (ref 8.9–10.3)
Chloride: 108 mmol/L (ref 98–111)
Creatinine: 0.96 mg/dL (ref 0.44–1.00)
GFR, Estimated: >60 mL/min (ref >60)
Glucose, Bld: 122 mg/dL — ABNORMAL HIGH (ref 70–99)
Potassium: 3.8 mmol/L (ref 3.5–5.1)
Sodium: 140 mmol/L (ref 135–145)
Total Bilirubin: 0.2 mg/dL — ABNORMAL LOW (ref 0.3–1.2)
Total Protein: 6.6 g/dL (ref 6.5–8.1)

## 2022-06-04 MED ORDER — SODIUM CHLORIDE 0.9 % IV SOLN
500.0000 mg/m2 | Freq: Once | INTRAVENOUS | Status: AC
  Administered 2022-06-04: 1060 mg via INTRAVENOUS
  Filled 2022-06-04: qty 53

## 2022-06-04 MED ORDER — HEPARIN SOD (PORK) LOCK FLUSH 100 UNIT/ML IV SOLN: 500.0000 [IU] | Freq: Once | INTRAVENOUS | Status: DC | PRN

## 2022-06-04 MED ORDER — SODIUM CHLORIDE 0.9% FLUSH
10.0000 mL | Freq: Once | INTRAVENOUS | Status: AC
  Administered 2022-06-04: 10 mL

## 2022-06-04 MED ORDER — PALONOSETRON HCL INJECTION 0.25 MG/5ML
0.2500 mg | Freq: Once | INTRAVENOUS | Status: AC
  Administered 2022-06-04: 0.25 mg via INTRAVENOUS
  Filled 2022-06-04: qty 5

## 2022-06-04 MED ORDER — SODIUM CHLORIDE 0.9 % IV SOLN: Freq: Once | INTRAVENOUS | Status: AC

## 2022-06-04 MED ORDER — SODIUM CHLORIDE 0.9 % IV SOLN
150.0000 mg | Freq: Once | INTRAVENOUS | Status: AC
  Administered 2022-06-04: 150 mg via INTRAVENOUS
  Filled 2022-06-04: qty 150

## 2022-06-04 MED ORDER — SODIUM CHLORIDE 0.9 % IV SOLN
10.0000 mg | Freq: Once | INTRAVENOUS | Status: AC
  Administered 2022-06-04: 10 mg via INTRAVENOUS
  Filled 2022-06-04: qty 10

## 2022-06-04 MED ORDER — DOXORUBICIN HCL CHEMO IV INJECTION 2 MG/ML
50.0000 mg/m2 | Freq: Once | INTRAVENOUS | Status: AC
  Administered 2022-06-04: 106 mg via INTRAVENOUS
  Filled 2022-06-04: qty 53

## 2022-06-04 MED ORDER — SODIUM CHLORIDE 0.9% FLUSH
10.0000 mL | INTRAVENOUS | Status: DC | PRN
  Administered 2022-06-04: 10 mL

## 2022-06-04 NOTE — Progress Notes (Signed)
Patient Care Team: Pcp, No as PCP - General Madison Kaufmann, RN as Oncology Nurse Navigator Madison Germany, RN as Oncology Nurse Navigator Madison Lose, MD as Consulting Physician (Hematology and Oncology)  DIAGNOSIS:  Encounter Diagnosis  Name Primary?   Malignant neoplasm of upper-inner quadrant of left breast in female, estrogen receptor positive ([place])     SUMMARY OF ONCOLOGIC HISTORY: Oncology History  Malignant neoplasm of upper-inner quadrant of left breast in female, estrogen receptor positive ([place])  10/22/2021 Initial Diagnosis   Palpable left breast mass. Diagnostic mammogram and [place]: an irregular mass within the upper inner left breast, IDC 2.2 cm ER 99%, PR 99%, HER2 negative, Ki-67 50%.  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  Total area of involvement 6 cm   10/22/2021 Cancer Staging   Staging form: Breast, AJCC 8th Edition - Clinical stage from 10/22/2021: Stage IIA (cT2, cN0(f), cM0, G3, ER+, PR+, HER2-) - Signed by Madison Lose, MD on 01/02/2022 Stage prefix: Initial diagnosis Method of lymph node assessment: Core biopsy Histologic grading system: 3 grade system   11/04/2021 Breast MRI   2.5 cm irregular enhancing mass UIQ left breast IDC with DCIS.  Additional non-mass enhancement extends anterior and inferior to the mass 2.5 cm total span 7.1 cm.   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, focal high-grade DCIS, margins negative, 1/2 lymph nodes positive ER 99%, PR 99%, HER2 negative, Ki-67 50%   03/24/2022 Oncotype testing   Oncotype DX recurrence score: 30 (distant recurrence at 9 years with hormone therapy alone: 23%   04/10/2022 -  Chemotherapy   Patient is on Treatment Plan : BREAST ADJUVANT DOSE DENSE AC q14d / PACLitaxel q7d       CHIEF COMPLIANT: Left breast cancer on Adriamycin and Cytoxan, today cycle 4  INTERVAL HISTORY: Madison Becker is a 66 y.o.  with the above mentioned h/o  Left breast cancer. She presents to the clinic today for treatment and follow-up. States that she had mild fatigue. Complains of body aches from neck to back from the injection. States that her urine has heavy odor. Denies nausea.  ALLERGIES:  is allergic to beef-derived products and pork-derived products.  MEDICATIONS:  Current Outpatient Medications  Medication Sig Dispense Refill   acetaminophen (TYLENOL) 500 MG tablet Take 1,000 mg by mouth every 6 (six) hours as needed for mild pain.     loratadine (CLARITIN) 10 MG tablet Take 10 mg by mouth daily as needed (for pain after chemo Treatment).     dexamethasone (DECADRON) 4 MG tablet Take 1 tablet (4 mg total) by mouth daily. Take daily for 3 days after chemo. Take with food. 8 tablet 0   lidocaine-prilocaine (EMLA) cream Apply to affected area once 30 g 3   ondansetron (ZOFRAN) 8 MG tablet Take 1 tablet (8 mg total) by mouth 2 (two) times daily as needed. Start on the third day after chemotherapy. 30 tablet 1   prochlorperazine (COMPAZINE) 10 MG tablet Take 1 tablet (10 mg total) by mouth every 6 (six) hours as needed (Nausea or vomiting). 30 tablet 1   No current facility-administered medications for this visit.    PHYSICAL EXAMINATION: ECOG PERFORMANCE STATUS: 1 - Symptomatic but completely ambulatory  Vitals:   06/04/22 1143  BP: 130/74  Pulse: 83  Resp: 18  Temp: 97.8 F (36.6 C)  SpO2: 100%   Filed Weights  06/04/22 1143  Weight: 210 lb (95.3 kg)    LABORATORY DATA:  I have reviewed the data as listed    Latest Ref Rng & Units 05/21/2022    9:20 AM 05/09/2022    6:03 AM 05/08/2022    4:30 AM  CMP  Glucose 70 - 99 mg/dL 148  105  102   BUN 8 - 23 mg/dL '14  9  7   ' Creatinine 0.44 - 1.00 mg/dL 0.94  0.96  0.91   Sodium 135 - 145 mmol/L 140  137  138   Potassium 3.5 - 5.1 mmol/L 3.7  3.6  3.8   Chloride 98 - 111 mmol/L 108  105  107   CO2 22 - 32 mmol/L '26  27  26   ' Calcium 8.9 - 10.3  mg/dL 9.0  8.5  8.1   Total Protein 6.5 - 8.1 g/dL 6.3     Total Bilirubin 0.3 - 1.2 mg/dL 0.4     Alkaline Phos 38 - 126 U/L 58     AST 15 - 41 U/L 15     ALT 0 - 44 U/L 12       Lab Results  Component Value Date   WBC 4.4 06/04/2022   HGB 8.9 (L) 06/04/2022   HCT 27.1 (L) 06/04/2022   MCV 86.6 06/04/2022   PLT 116 (L) 06/04/2022   NEUTROABS 2.9 06/04/2022    ASSESSMENT & PLAN:  Malignant neoplasm of upper-inner quadrant of left breast in female, estrogen receptor positive ([place]) 03/05/2022:Left mastectomy: 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  Adjuvant chemotherapy with dose dense Adriamycin and Cytoxan x4 followed by Taxol x12 2. adjuvant antiestrogen therapy ----------------------------------------------------------------------------------------------------------------------------------------- Current treatment: Cycle 4 dose dense Adriamycin and Cytoxan Chemotoxicities: Hair loss. Anemia: Patient started at a baseline hemoglobin of 10.7 and today is 9.8. Thrombocytopenia: Monitoring today it is 146 Hospitalization 05/07/2022-05/09/2022 (fever, tachycardia, CT chest negative for PE but showed bilateral upper lung infiltrates treated with Rocephin and azithromycin discharged home on Levaquin) Skin discoloration of her hands and nailbed discoloration as well Body aches: all over the body.   Chemo dose reduced 2 weeks for weekly Taxol   No orders of the defined types were placed in this encounter.  The patient has a good understanding of the overall plan. she agrees with it. she will call with any problems that may develop before the next visit here. Total time spent: 30 mins including face to face time and time spent for planning, charting and co-ordination of  care   Madison Ohara, MD 06/04/22    I Gardiner Coins am scribing for Dr. Lindi Adie  I have reviewed the above documentation for accuracy and completeness, and I agree with the above.

## 2022-06-04 NOTE — Assessment & Plan Note (Addendum)
03/05/2022:Left mastectomy: Grade 3 IDC 2.7 cm, focal high-grade DCIS, margins negative, 1/2 lymph nodes positive ER 99%, PR 99%, HER2 negative, Ki-67 50% 03/24/2022:Oncotype DX recurrence score: 30 (distant recurrence at 9 years with hormone therapy alone: 23%  Treatment plan: 1.Adjuvant chemotherapy with dose dense Adriamycin and Cytoxan x4 followed by Taxol x12 2.adjuvant antiestrogen therapy ----------------------------------------------------------------------------------------------------------------------------------------- Current treatment: Cycle4dose dense Adriamycin and Cytoxan Chemotoxicities: 1. Hair loss. 2. Anemia: Patient started at a baseline hemoglobin of 10.7 and today is9.8. 3. Thrombocytopenia: Monitoringtoday it is 146 4. Hospitalization 05/07/2022-05/09/2022 (fever, tachycardia, CT chest negative for PE but showed bilateral upper lung infiltrates treated with Rocephin and azithromycin discharged home on Levaquin) 5. Skin discoloration of her hands and nailbed discoloration as well 6. Body aches: all over the body.  Chemo dose reduced 2 weeks for weekly Taxol

## 2022-06-04 NOTE — Telephone Encounter (Signed)
Record only request received from [facility] addressed to [facility], Dr. Lindi Adie.  Discussed reason for delay of records/PHI with patient explaining initial request addressed to physician not legal name "Madison Becker".  [facility] faxed letter and ROI to [facility] newly signed [facility] authorization for release today.  Faxed request with [facility] ROI to ([facility]) [facility]  Form to "Record Release" bin in front office area before [facility] for [facility] staff to forward request to [facility] (SW) Information Management Office, Phone: [phone], Fax: [phone].

## 2022-06-05 NOTE — Progress Notes (Signed)
Patient Care Team: Pcp, No as PCP - General [name], RN as Oncology Nurse Navigator [name], RN as Oncology Nurse Navigator [name], MD as Consulting Physician (Hematology and Oncology)  DIAGNOSIS:  Encounter Diagnosis  Name Primary?   Malignant neoplasm of upper-inner quadrant of left breast in female, estrogen receptor positive ([place])     SUMMARY OF ONCOLOGIC HISTORY: Oncology History  Malignant neoplasm of upper-inner quadrant of left breast in female, estrogen receptor positive ([place])  10/22/2021 Initial Diagnosis   Palpable left breast mass. Diagnostic mammogram and [place]: an irregular mass within the upper inner left breast, IDC 2.2 cm ER 99%, PR 99%, HER2 negative, Ki-67 50%.  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  Total area of involvement 6 cm   10/22/2021 Cancer Staging   Staging form: Breast, AJCC 8th Edition - Clinical stage from 10/22/2021: Stage IIA (cT2, cN0(f), cM0, G3, ER+, PR+, HER2-) - Signed by [name], MD on 01/02/2022 Stage prefix: Initial diagnosis Method of lymph node assessment: Core biopsy Histologic grading system: 3 grade system   11/04/2021 Breast MRI   2.5 cm irregular enhancing mass UIQ left breast IDC with DCIS.  Additional non-mass enhancement extends anterior and inferior to the mass 2.5 cm total span 7.1 cm.   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, focal high-grade DCIS, margins negative, 1/2 lymph nodes positive ER 99%, PR 99%, HER2 negative, Ki-67 50%   03/24/2022 Oncotype testing   Oncotype DX recurrence score: 30 (distant recurrence at 9 years with hormone therapy alone: 23%   04/10/2022 -  Chemotherapy   Patient is on Treatment Plan : BREAST ADJUVANT DOSE DENSE AC q14d / PACLitaxel q7d       CHIEF COMPLIANT: Day 1 cycle 1 Taxol  INTERVAL HISTORY: Madison Becker is a 66 y.o with the above mention. She presents to  the clinic today for a follow-up and treatment. She was concern about the sleeves if she could wear them for compression. States after the injection she was very achy. States after a day she was really tired but after that her energy level went back up. States that she had some indigestion. States that she cant eat carb's it turns bitter and taste like cardboard. She also complains of darkness on hands and feet.  ALLERGIES:  is allergic to beef-derived products and pork-derived products.  MEDICATIONS:  Current Outpatient Medications  Medication Sig Dispense Refill   acetaminophen (TYLENOL) 500 MG tablet Take 1,000 mg by mouth every 6 (six) hours as needed for mild pain.     dexamethasone (DECADRON) 4 MG tablet Take 1 tablet (4 mg total) by mouth daily. Take daily for 3 days after chemo. Take with food. 8 tablet 0   lidocaine-prilocaine (EMLA) cream Apply to affected area once 30 g 3   loratadine (CLARITIN) 10 MG tablet Take 10 mg by mouth daily as needed (for pain after chemo Treatment).     ondansetron (ZOFRAN) 8 MG tablet Take 1 tablet (8 mg total) by mouth 2 (two) times daily as needed. Start on the third day after chemotherapy. 30 tablet 1   prochlorperazine (COMPAZINE) 10 MG tablet Take 1 tablet (10 mg total) by mouth every 6 (six) hours as needed (Nausea or vomiting). 30 tablet 1   No current facility-administered medications for this visit.    PHYSICAL EXAMINATION: ECOG PERFORMANCE STATUS: 1 - Symptomatic  but completely ambulatory  Vitals:   06/19/22 1036  BP: 134/80  Pulse: 93  Resp: 18  Temp: 97.9 F (36.6 C)  SpO2: 100%   Filed Weights   06/19/22 1036  Weight: 206 lb 1.6 oz (93.5 kg)     LABORATORY DATA:  I have reviewed the data as listed    Latest Ref Rng & Units 06/04/2022   11:26 AM 05/21/2022    9:20 AM 05/09/2022    6:03 AM  CMP  Glucose 70 - 99 mg/dL 122  148  105   BUN 8 - 23 mg/dL '12  14  9   ' Creatinine 0.44 - 1.00 mg/dL 0.96  0.94  0.96   Sodium 135 - 145  mmol/L 140  140  137   Potassium 3.5 - 5.1 mmol/L 3.8  3.7  3.6   Chloride 98 - 111 mmol/L 108  108  105   CO2 22 - 32 mmol/L '26  26  27   ' Calcium 8.9 - 10.3 mg/dL 9.1  9.0  8.5   Total Protein 6.5 - 8.1 g/dL 6.6  6.3    Total Bilirubin 0.3 - 1.2 mg/dL 0.2  0.4    Alkaline Phos 38 - 126 U/L 75  58    AST 15 - 41 U/L 17  15    ALT 0 - 44 U/L 18  12      Lab Results  Component Value Date   WBC 3.2 (L) 06/19/2022   HGB 8.3 (L) 06/19/2022   HCT 24.9 (L) 06/19/2022   MCV 86.2 06/19/2022   PLT 157 06/19/2022   NEUTROABS 2.0 06/19/2022    ASSESSMENT & PLAN:  Malignant neoplasm of upper-inner quadrant of left breast in female, estrogen receptor positive ([place]) 03/05/2022:Left mastectomy: 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  Adjuvant chemotherapy with dose dense Adriamycin and Cytoxan x4 followed by Taxol x12 2. adjuvant antiestrogen therapy ----------------------------------------------------------------------------------------------------------------------------------------- Current treatment: Completed 4 cycles of dose dense Adriamycin and Cytoxan, Today is cycle 1 Taxol  Chemotoxicities: Hair loss. Anemia: Patient started at a baseline hemoglobin of 10.7 and today is 8.3. Thrombocytopenia: Monitoring today it is 146 Hospitalization 05/07/2022-05/09/2022 (fever, tachycardia, CT chest negative for PE but showed bilateral upper lung infiltrates treated with Rocephin and azithromycin discharged home on Levaquin) Skin discoloration of her hands and nailbed discoloration as well Body aches: all over the body. She is going to use ice for neuropathy prevention. RTC weekly for Taxol and every other week for follow up with me    No orders of the defined types were placed in  this encounter.  The patient has a good understanding of the overall plan. she agrees with it. she will call with any problems that may develop before the next visit here. Total time spent: 30 mins including face to face time and time spent for planning, charting and co-ordination of care   [name], MD 06/19/22    I Gardiner Coins am scribing for Dr. Lindi Adie  I have reviewed the above documentation for accuracy and completeness, and I agree with the above.

## 2022-06-06 ENCOUNTER — Other Ambulatory Visit: Payer: Self-pay

## 2022-06-06 ENCOUNTER — Inpatient Hospital Stay: Payer: BC Managed Care – PPO

## 2022-06-06 VITALS — BP 126/86 | HR 56 | Temp 98.9°F | Resp 18

## 2022-06-06 DIAGNOSIS — Z170 Estrogen receptor positive status [ER+]: Secondary | ICD-10-CM

## 2022-06-06 DIAGNOSIS — Z5111 Encounter for antineoplastic chemotherapy: Secondary | ICD-10-CM | POA: Diagnosis not present

## 2022-06-06 MED ORDER — PEGFILGRASTIM-CBQV 6 MG/0.6ML ~~LOC~~ SOSY
6.0000 mg | PREFILLED_SYRINGE | Freq: Once | SUBCUTANEOUS | Status: AC
  Administered 2022-06-06: 6 mg via SUBCUTANEOUS
  Filled 2022-06-06: qty 0.6

## 2022-06-18 MED FILL — Dexamethasone Sodium Phosphate Inj 100 MG/10ML: INTRAMUSCULAR | Qty: 1 | Status: AC

## 2022-06-18 NOTE — Assessment & Plan Note (Signed)
03/05/2022:Left mastectomy: Grade 3 IDC 2.7 cm, focal high-grade DCIS, margins negative, 1/2 lymph nodes positive ER 99%, PR 99%, HER2 negative, Ki-67 50% 03/24/2022:Oncotype DX recurrence score: 30 (distant recurrence at 9 years with hormone therapy alone: 23%  Treatment plan: 1.Adjuvant chemotherapy with dose dense Adriamycin and Cytoxan x4 followed by Taxol x12 2.adjuvant antiestrogen therapy ----------------------------------------------------------------------------------------------------------------------------------------- Current treatment: Completed 4 cycles ofdose dense Adriamycin and Cytoxan, Today is cycle 1 Taxol  Chemotoxicities: 1. Hair loss. 2. Anemia: Patient started at a baseline hemoglobin of 10.7 and today is9.8. 3. Thrombocytopenia: Monitoringtoday it is 146 4. Hospitalization 05/07/2022-05/09/2022 (fever, tachycardia, CT chest negative for PEbut showed bilateral upper lung infiltratestreated with Rocephin and azithromycin discharged home on Levaquin) 5. Skin discoloration of her hands and nailbed discoloration as well 6. Body aches: all over the body.  RTC weekly for Taxol and every other week for follow up with me

## 2022-06-19 ENCOUNTER — Inpatient Hospital Stay: Payer: BC Managed Care – PPO

## 2022-06-19 ENCOUNTER — Other Ambulatory Visit: Payer: Self-pay

## 2022-06-19 ENCOUNTER — Inpatient Hospital Stay (HOSPITAL_BASED_OUTPATIENT_CLINIC_OR_DEPARTMENT_OTHER): Payer: BC Managed Care – PPO | Admitting: Hematology and Oncology

## 2022-06-19 VITALS — BP 131/76 | HR 76 | Temp 98.2°F | Resp 18

## 2022-06-19 DIAGNOSIS — C50212 Malignant neoplasm of upper-inner quadrant of left female breast: Secondary | ICD-10-CM | POA: Diagnosis not present

## 2022-06-19 DIAGNOSIS — Z5111 Encounter for antineoplastic chemotherapy: Secondary | ICD-10-CM | POA: Diagnosis not present

## 2022-06-19 DIAGNOSIS — Z170 Estrogen receptor positive status [ER+]: Secondary | ICD-10-CM | POA: Diagnosis not present

## 2022-06-19 DIAGNOSIS — Z95828 Presence of other vascular implants and grafts: Secondary | ICD-10-CM

## 2022-06-19 LAB — COMPREHENSIVE METABOLIC PANEL
ALT: 14 U/L (ref 0–44)
AST: 14 U/L — ABNORMAL LOW (ref 15–41)
Albumin: 3.7 g/dL (ref 3.5–5.0)
Alkaline Phosphatase: 67 U/L (ref 38–126)
Anion gap: 6 (ref 5–15)
BUN: 11 mg/dL (ref 8–23)
CO2: 26 mmol/L (ref 22–32)
Calcium: 9.2 mg/dL (ref 8.9–10.3)
Chloride: 108 mmol/L (ref 98–111)
Creatinine, Ser: 0.94 mg/dL (ref 0.44–1.00)
GFR, Estimated: >60 mL/min (ref >60)
Glucose, Bld: 130 mg/dL — ABNORMAL HIGH (ref 70–99)
Potassium: 3.6 mmol/L (ref 3.5–5.1)
Sodium: 140 mmol/L (ref 135–145)
Total Bilirubin: 0.3 mg/dL (ref 0.3–1.2)
Total Protein: 6.4 g/dL — ABNORMAL LOW (ref 6.5–8.1)

## 2022-06-19 LAB — CBC WITH DIFFERENTIAL/PLATELET
Abs Immature Granulocytes: 0.01 10*3/uL (ref 0.00–0.07)
Basophils Absolute: 0 10*3/uL (ref 0.0–0.1)
Basophils Relative: 0 %
Eosinophils Absolute: 0 10*3/uL (ref 0.0–0.5)
Eosinophils Relative: 1 %
HCT: 24.9 % — ABNORMAL LOW (ref 36.0–46.0)
Hemoglobin: 8.3 g/dL — ABNORMAL LOW (ref 12.0–15.0)
Immature Granulocytes: 0 %
Lymphocytes Relative: 16 %
Lymphs Abs: 0.5 10*3/uL — ABNORMAL LOW (ref 0.7–4.0)
MCH: 28.7 pg (ref 26.0–34.0)
MCHC: 33.3 g/dL (ref 30.0–36.0)
MCV: 86.2 fL (ref 80.0–100.0)
Monocytes Absolute: 0.7 10*3/uL (ref 0.1–1.0)
Monocytes Relative: 21 %
Neutro Abs: 2 10*3/uL (ref 1.7–7.7)
Neutrophils Relative %: 62 %
Platelets: 157 10*3/uL (ref 150–400)
RBC: 2.89 MIL/uL — ABNORMAL LOW (ref 3.87–5.11)
RDW: 16.4 % — ABNORMAL HIGH (ref 11.5–15.5)
WBC: 3.2 10*3/uL — ABNORMAL LOW (ref 4.0–10.5)
nRBC: 0 % (ref 0.0–0.2)

## 2022-06-19 MED ORDER — DIPHENHYDRAMINE HCL 50 MG/ML IJ SOLN
25.0000 mg | Freq: Once | INTRAMUSCULAR | Status: AC
  Administered 2022-06-19: 25 mg via INTRAVENOUS
  Filled 2022-06-19: qty 1

## 2022-06-19 MED ORDER — SODIUM CHLORIDE 0.9% FLUSH
10.0000 mL | INTRAVENOUS | Status: DC | PRN
  Administered 2022-06-19: 10 mL

## 2022-06-19 MED ORDER — SODIUM CHLORIDE 0.9 % IV SOLN: Freq: Once | INTRAVENOUS | Status: AC

## 2022-06-19 MED ORDER — FAMOTIDINE IN NACL 20-0.9 MG/50ML-% IV SOLN
20.0000 mg | Freq: Once | INTRAVENOUS | Status: AC
  Administered 2022-06-19: 20 mg via INTRAVENOUS
  Filled 2022-06-19: qty 50

## 2022-06-19 MED ORDER — SODIUM CHLORIDE 0.9 % IV SOLN
10.0000 mg | Freq: Once | INTRAVENOUS | Status: AC
  Administered 2022-06-19: 10 mg via INTRAVENOUS
  Filled 2022-06-19: qty 10

## 2022-06-19 MED ORDER — SODIUM CHLORIDE 0.9 % IV SOLN
80.0000 mg/m2 | Freq: Once | INTRAVENOUS | Status: AC
  Administered 2022-06-19: 168 mg via INTRAVENOUS
  Filled 2022-06-19: qty 28

## 2022-06-19 MED ORDER — SODIUM CHLORIDE 0.9% FLUSH
10.0000 mL | Freq: Once | INTRAVENOUS | Status: AC
  Administered 2022-06-19: 10 mL

## 2022-06-19 NOTE — Patient Instructions (Signed)
[facility]  Discharge Instructions: Thank you for choosing [facility] to provide your oncology and hematology care.   If you have a lab appointment with the [facility], please go directly to the [facility] and check in at the registration area.   Wear comfortable clothing and clothing appropriate for easy access to any Portacath or PICC line.   We strive to give you quality time with your provider. You may need to reschedule your appointment if you arrive late (15 or more minutes).  Arriving late affects you and other patients whose appointments are after yours.  Also, if you miss three or more appointments without notifying the office, you may be dismissed from the clinic at the provider's discretion.      For prescription refill requests, have your pharmacy contact our office and allow 72 hours for refills to be completed.    Today you received the following chemotherapy and/or immunotherapy agents: Taxol      To help prevent nausea and vomiting after your treatment, we encourage you to take your nausea medication as directed.  BELOW ARE SYMPTOMS THAT SHOULD BE REPORTED IMMEDIATELY: *FEVER GREATER THAN 100.4 F (38 C) OR HIGHER *CHILLS OR SWEATING *NAUSEA AND VOMITING THAT IS NOT CONTROLLED WITH YOUR NAUSEA MEDICATION *UNUSUAL SHORTNESS OF BREATH *UNUSUAL BRUISING OR BLEEDING *URINARY PROBLEMS (pain or burning when urinating, or frequent urination) *BOWEL PROBLEMS (unusual diarrhea, constipation, pain near the anus) TENDERNESS IN MOUTH AND THROAT WITH OR WITHOUT PRESENCE OF ULCERS (sore throat, sores in mouth, or a toothache) UNUSUAL RASH, SWELLING OR PAIN  UNUSUAL VAGINAL DISCHARGE OR ITCHING   Items with * indicate a potential emergency and should be followed up as soon as possible or go to the Emergency Department if any problems should occur.  Please show the CHEMOTHERAPY ALERT CARD or IMMUNOTHERAPY ALERT CARD at check-in to the  Emergency Department and triage nurse.  Should you have questions after your visit or need to cancel or reschedule your appointment, please contact [facility]  Dept: [phone]  and follow the prompts.  Office hours are 8:00 a.m. to 4:30 p.m. Monday - Friday. Please note that voicemails left after 4:00 p.m. may not be returned until the following business day.  We are closed weekends and major holidays. You have access to a nurse at all times for urgent questions. Please call the main number to the clinic Dept: [phone] and follow the prompts.   For any non-urgent questions, you may also contact your provider using MyChart. We now offer e-Visits for anyone 38 and older to request care online for non-urgent symptoms. For details visit [email or web address].   Also download the MyChart app! Go to the app store, search "MyChart", open the app, select [facility], and log in with your MyChart username and password.  Masks are optional in the cancer centers. If you would like for your care team to wear a mask while they are taking care of you, please let them know. For doctor visits, patients may have with them one support person who is at least 64 years old. At this time, visitors are not allowed in the infusion area.

## 2022-06-20 ENCOUNTER — Telehealth: Payer: Self-pay

## 2022-06-20 NOTE — Telephone Encounter (Signed)
-----   Message from Willis Modena, RN sent at 06/19/2022  2:56 PM EDT ----- Regarding: Dr. Lindi Adie 1st time Taxol f/u Dr. Lindi Adie 1st time Taxol f/u tol tx well. Pt call back due 06/20/22

## 2022-06-20 NOTE — Telephone Encounter (Signed)
LM for patient that this nurse was calling to see how they were doing after their treatment. Please call back to Dr. Gudena's nurse at [phone] if they have any questions or concerns regarding the treatment. 

## 2022-06-25 MED FILL — Dexamethasone Sodium Phosphate Inj 100 MG/10ML: INTRAMUSCULAR | Qty: 1 | Status: AC

## 2022-06-26 ENCOUNTER — Inpatient Hospital Stay: Payer: Self-pay | Attending: Hematology and Oncology

## 2022-06-26 ENCOUNTER — Other Ambulatory Visit: Payer: Self-pay | Admitting: Hematology and Oncology

## 2022-06-26 ENCOUNTER — Inpatient Hospital Stay: Payer: Self-pay

## 2022-06-26 ENCOUNTER — Inpatient Hospital Stay (HOSPITAL_BASED_OUTPATIENT_CLINIC_OR_DEPARTMENT_OTHER): Payer: BC Managed Care – PPO | Admitting: Hematology and Oncology

## 2022-06-26 ENCOUNTER — Other Ambulatory Visit: Payer: Self-pay

## 2022-06-26 DIAGNOSIS — C50212 Malignant neoplasm of upper-inner quadrant of left female breast: Secondary | ICD-10-CM | POA: Insufficient documentation

## 2022-06-26 DIAGNOSIS — Z95828 Presence of other vascular implants and grafts: Secondary | ICD-10-CM

## 2022-06-26 DIAGNOSIS — Z79899 Other long term (current) drug therapy: Secondary | ICD-10-CM | POA: Insufficient documentation

## 2022-06-26 DIAGNOSIS — Z170 Estrogen receptor positive status [ER+]: Secondary | ICD-10-CM

## 2022-06-26 DIAGNOSIS — Z5111 Encounter for antineoplastic chemotherapy: Secondary | ICD-10-CM | POA: Insufficient documentation

## 2022-06-26 LAB — CBC WITH DIFFERENTIAL/PLATELET
Abs Immature Granulocytes: 0.01 10*3/uL (ref 0.00–0.07)
Basophils Absolute: 0 10*3/uL (ref 0.0–0.1)
Basophils Relative: 1 %
Eosinophils Absolute: 0 10*3/uL (ref 0.0–0.5)
Eosinophils Relative: 1 %
HCT: 23.4 % — ABNORMAL LOW (ref 36.0–46.0)
Hemoglobin: 7.8 g/dL — ABNORMAL LOW (ref 12.0–15.0)
Immature Granulocytes: 0 %
Lymphocytes Relative: 21 %
Lymphs Abs: 0.5 10*3/uL — ABNORMAL LOW (ref 0.7–4.0)
MCH: 28.7 pg (ref 26.0–34.0)
MCHC: 33.3 g/dL (ref 30.0–36.0)
MCV: 86 fL (ref 80.0–100.0)
Monocytes Absolute: 0.4 10*3/uL (ref 0.1–1.0)
Monocytes Relative: 19 %
Neutro Abs: 1.4 10*3/uL — ABNORMAL LOW (ref 1.7–7.7)
Neutrophils Relative %: 58 %
Platelets: 207 10*3/uL (ref 150–400)
RBC: 2.72 MIL/uL — ABNORMAL LOW (ref 3.87–5.11)
RDW: 17.6 % — ABNORMAL HIGH (ref 11.5–15.5)
WBC: 2.3 10*3/uL — ABNORMAL LOW (ref 4.0–10.5)
nRBC: 0 % (ref 0.0–0.2)

## 2022-06-26 LAB — COMPREHENSIVE METABOLIC PANEL
ALT: 17 U/L (ref 0–44)
AST: 17 U/L (ref 15–41)
Albumin: 3.8 g/dL (ref 3.5–5.0)
Alkaline Phosphatase: 53 U/L (ref 38–126)
Anion gap: 4 — ABNORMAL LOW (ref 5–15)
BUN: 11 mg/dL (ref 8–23)
CO2: 28 mmol/L (ref 22–32)
Calcium: 9.3 mg/dL (ref 8.9–10.3)
Chloride: 109 mmol/L (ref 98–111)
Creatinine, Ser: 1.13 mg/dL — ABNORMAL HIGH (ref 0.44–1.00)
GFR, Estimated: 55 mL/min — ABNORMAL LOW (ref >60)
Glucose, Bld: 100 mg/dL — ABNORMAL HIGH (ref 70–99)
Potassium: 4.2 mmol/L (ref 3.5–5.1)
Sodium: 141 mmol/L (ref 135–145)
Total Bilirubin: 0.3 mg/dL (ref 0.3–1.2)
Total Protein: 6.5 g/dL (ref 6.5–8.1)

## 2022-06-26 MED ORDER — SODIUM CHLORIDE 0.9% FLUSH
10.0000 mL | INTRAVENOUS | Status: DC | PRN
  Administered 2022-06-26: 10 mL

## 2022-06-26 MED ORDER — DIPHENHYDRAMINE HCL 50 MG/ML IJ SOLN
25.0000 mg | Freq: Once | INTRAMUSCULAR | Status: AC
  Administered 2022-06-26: 25 mg via INTRAVENOUS
  Filled 2022-06-26: qty 1

## 2022-06-26 MED ORDER — SODIUM CHLORIDE 0.9 % IV SOLN: Freq: Once | INTRAVENOUS | Status: AC

## 2022-06-26 MED ORDER — DEXAMETHASONE 4 MG PO TABS: 4.0000 mg | ORAL_TABLET | Freq: Every day | ORAL | 0 refills | Status: DC

## 2022-06-26 MED ORDER — FAMOTIDINE IN NACL 20-0.9 MG/50ML-% IV SOLN
20.0000 mg | Freq: Once | INTRAVENOUS | Status: AC
  Administered 2022-06-26: 20 mg via INTRAVENOUS
  Filled 2022-06-26: qty 50

## 2022-06-26 MED ORDER — HEPARIN SOD (PORK) LOCK FLUSH 100 UNIT/ML IV SOLN
500.0000 [IU] | Freq: Once | INTRAVENOUS | Status: AC | PRN
  Administered 2022-06-26: 500 [IU]

## 2022-06-26 MED ORDER — SODIUM CHLORIDE 0.9 % IV SOLN
60.0000 mg/m2 | Freq: Once | INTRAVENOUS | Status: AC
  Administered 2022-06-26: 126 mg via INTRAVENOUS
  Filled 2022-06-26: qty 21

## 2022-06-26 MED ORDER — SODIUM CHLORIDE 0.9 % IV SOLN
10.0000 mg | Freq: Once | INTRAVENOUS | Status: AC
  Administered 2022-06-26: 10 mg via INTRAVENOUS
  Filled 2022-06-26: qty 10

## 2022-06-26 MED ORDER — SODIUM CHLORIDE 0.9% FLUSH
10.0000 mL | Freq: Once | INTRAVENOUS | Status: AC
  Administered 2022-06-26: 10 mL

## 2022-06-26 NOTE — Progress Notes (Signed)
Patient Care Team: Pcp, No as PCP - General [name], RN as Oncology Nurse Navigator [name], RN as Oncology Nurse Navigator [name], MD as Consulting Physician (Hematology and Oncology)  DIAGNOSIS:  Encounter Diagnosis  Name Primary?   Malignant neoplasm of upper-inner quadrant of left breast in female, estrogen receptor positive (Madison Becker)     SUMMARY OF ONCOLOGIC HISTORY: Oncology History  Malignant neoplasm of upper-inner quadrant of left breast in female, estrogen receptor positive (Madison Becker)  10/22/2021 Initial Diagnosis   Palpable left breast mass. Diagnostic mammogram and [place]: an irregular mass within the upper inner left breast, IDC 2.2 cm ER 99%, PR 99%, HER2 negative, Ki-67 50%.  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  Total area of involvement 6 cm   10/22/2021 Cancer Staging   Staging form: Breast, AJCC 8th Edition - Clinical stage from 10/22/2021: Stage IIA (cT2, cN0(f), cM0, G3, ER+, PR+, HER2-) - Signed by [name], MD on 01/02/2022 Stage prefix: Initial diagnosis Method of lymph node assessment: Core biopsy Histologic grading system: 3 grade system   11/04/2021 Breast MRI   2.5 cm irregular enhancing mass UIQ left breast IDC with DCIS.  Additional non-mass enhancement extends anterior and inferior to the mass 2.5 cm total span 7.1 cm.   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, focal high-grade DCIS, margins negative, 1/2 lymph nodes positive ER 99%, PR 99%, HER2 negative, Ki-67 50%   03/24/2022 Oncotype testing   Oncotype DX recurrence score: 30 (distant recurrence at 9 years with hormone therapy alone: 23%   04/10/2022 -  Chemotherapy   Patient is on Treatment Plan : BREAST ADJUVANT DOSE DENSE AC q14d / PACLitaxel q7d       CHIEF COMPLIANT: Day 1 cycle 2 Taxol  INTERVAL HISTORY: Madison Becker is a  66 y.o with the above mention. She presents to  the clinic today for a follow-up and treatment.  She tolerated cycle 1 Taxol extremely well.  She did not have any treatment.  Benadryl made her very sleepy.  She left for home and she slept for several hours afterwards.  She did have bilateral knee pain on day 3 and day 4.  Denies any nausea or vomiting.  Denies any peripheral neuropathy.   ALLERGIES:  is allergic to beef-derived products and pork-derived products.  MEDICATIONS:  Current Outpatient Medications  Medication Sig Dispense Refill   acetaminophen (TYLENOL) 500 MG tablet Take 1,000 mg by mouth every 6 (six) hours as needed for mild pain.     dexamethasone (DECADRON) 4 MG tablet Take 1 tablet (4 mg total) by mouth daily. Take daily for 3 days after chemo. Take with food. 8 tablet 0   lidocaine-prilocaine (EMLA) cream Apply to affected area once 30 g 3   loratadine (CLARITIN) 10 MG tablet Take 10 mg by mouth daily as needed (for pain after chemo Treatment).     ondansetron (ZOFRAN) 8 MG tablet Take 1 tablet (8 mg total) by mouth 2 (two) times daily as needed. Start on the third day after chemotherapy. 30 tablet 1   prochlorperazine (COMPAZINE) 10 MG tablet Take 1 tablet (10 mg total) by mouth every 6 (six) hours as needed (Nausea or vomiting). 30 tablet 1   No current facility-administered medications for this visit.    PHYSICAL EXAMINATION: ECOG PERFORMANCE STATUS: 1 - Symptomatic but completely ambulatory  Vitals:  06/26/22 1052  BP: 133/82  Pulse: 76  Resp: 18  Temp: 98.1 F (36.7 C)  SpO2: 100%   Filed Weights   06/26/22 1052  Weight: 205 lb 4.8 oz (93.1 kg)      LABORATORY DATA:  I have reviewed the data as listed    Latest Ref Rng & Units 06/19/2022   10:22 AM 06/04/2022   11:26 AM 05/21/2022    9:20 AM  CMP  Glucose 70 - 99 mg/dL 130  122  148   BUN 8 - 23 mg/dL _0 Creatinine 0.44 - 1.00 mg/dL 0.94  0.96  0.94   Sodium 135 - 145 mmol/L 140  140  140   Potassium 3.5 - 5.1 mmol/L 3.6  3.8  3.7    Chloride 98 - 111 mmol/L 108  108  108   CO2 22 - 32 mmol/L _1 Calcium 8.9 - 10.3 mg/dL 9.2  9.1  9.0   Total Protein 6.5 - 8.1 g/dL 6.4  6.6  6.3   Total Bilirubin 0.3 - 1.2 mg/dL 0.3  0.2  0.4   Alkaline Phos 38 - 126 U/L 67  75  58   AST 15 - 41 U/L _2 ALT 0 - 44 U/L _3 Lab Results  Component Value Date   WBC 3.2 (L) 06/19/2022   HGB 8.3 (L) 06/19/2022   HCT 24.9 (L) 06/19/2022   MCV 86.2 06/19/2022   PLT 157 06/19/2022   NEUTROABS 2.0 06/19/2022    ASSESSMENT & PLAN:  Malignant neoplasm of upper-inner quadrant of left breast in female, estrogen receptor positive (Madison Becker) 03/05/2022:Left mastectomy: 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  Adjuvant chemotherapy with dose dense Adriamycin and Cytoxan x4 followed by Taxol x12 2. adjuvant antiestrogen therapy ----------------------------------------------------------------------------------------------------------------------------------------- Current treatment: Completed 4 cycles of dose dense Adriamycin and Cytoxan, Today is cycle 2 Taxol   Chemotoxicities: Hair loss. Anemia: Patient started at a baseline hemoglobin of 10.7 and today is 8.3. Thrombocytopenia: Monitoring today it is 146 Hospitalization 05/07/2022-05/09/2022 (fever, tachycardia, CT chest negative for PE but showed bilateral upper lung infiltrates treated with Rocephin and azithromycin discharged home on Levaquin) Skin discoloration of her hands and nailbed discoloration as well Body aches: all over the body. She is going to use ice for neuropathy prevention. RTC weekly for Taxol and every other week for follow up with me    No orders of the defined types were placed in this encounter.  The patient has a good  understanding of the overall plan. she agrees with it. she will call with any problems that may develop before the next visit here. Total time spent: 30 mins including face to face time and time spent for planning, charting and co-ordination of care   [name], MD 06/26/22    I Gardiner Coins am scribing for Dr. Lindi Adie  I have reviewed the above documentation for accuracy and completeness, and I agree with the above.

## 2022-06-26 NOTE — Patient Instructions (Signed)
[facility]  Discharge Instructions: Thank you for choosing [facility] to provide your oncology and hematology care.   If you have a lab appointment with the [facility], please go directly to the [facility] and check in at the registration area.   Wear comfortable clothing and clothing appropriate for easy access to any Portacath or PICC line.   We strive to give you quality time with your provider. You may need to reschedule your appointment if you arrive late (15 or more minutes).  Arriving late affects you and other patients whose appointments are after yours.  Also, if you miss three or more appointments without notifying the office, you may be dismissed from the clinic at the provider's discretion.      For prescription refill requests, have your pharmacy contact our office and allow 72 hours for refills to be completed.    Today you received the following chemotherapy and/or immunotherapy agents: Taxol  (Paclitaxel)   To help prevent nausea and vomiting after your treatment, we encourage you to take your nausea medication as directed.  BELOW ARE SYMPTOMS THAT SHOULD BE REPORTED IMMEDIATELY: *FEVER GREATER THAN 100.4 F (38 C) OR HIGHER *CHILLS OR SWEATING *NAUSEA AND VOMITING THAT IS NOT CONTROLLED WITH YOUR NAUSEA MEDICATION *UNUSUAL SHORTNESS OF BREATH *UNUSUAL BRUISING OR BLEEDING *URINARY PROBLEMS (pain or burning when urinating, or frequent urination) *BOWEL PROBLEMS (unusual diarrhea, constipation, pain near the anus) TENDERNESS IN MOUTH AND THROAT WITH OR WITHOUT PRESENCE OF ULCERS (sore throat, sores in mouth, or a toothache) UNUSUAL RASH, SWELLING OR PAIN  UNUSUAL VAGINAL DISCHARGE OR ITCHING   Items with * indicate a potential emergency and should be followed up as soon as possible or go to the Emergency Department if any problems should occur.  Please show the CHEMOTHERAPY ALERT CARD or IMMUNOTHERAPY ALERT CARD at  check-in to the Emergency Department and triage nurse.  Should you have questions after your visit or need to cancel or reschedule your appointment, please contact [facility]  Dept: [phone]  and follow the prompts.  Office hours are 8:00 a.m. to 4:30 p.m. Monday - Friday. Please note that voicemails left after 4:00 p.m. may not be returned until the following business day.  We are closed weekends and major holidays. You have access to a nurse at all times for urgent questions. Please call the main number to the clinic Dept: [phone] and follow the prompts.   For any non-urgent questions, you may also contact your provider using MyChart. We now offer e-Visits for anyone 46 and older to request care online for non-urgent symptoms. For details visit [email or web address].   Also download the MyChart app! Go to the app store, search "MyChart", open the app, select [facility], and log in with your MyChart username and password.  Masks are optional in the cancer centers. If you would like for your care team to wear a mask while they are taking care of you, please let them know. For doctor visits, patients may have with them one support person who is at least 64 years old. At this time, visitors are not allowed in the infusion area.

## 2022-06-26 NOTE — Assessment & Plan Note (Addendum)
03/05/2022:Left mastectomy: Grade 3 IDC 2.7 cm, focal high-grade DCIS, margins negative, 1/2 lymph nodes positive ER 99%, PR 99%, HER2 negative, Ki-67 50% 03/24/2022:Oncotype DX recurrence score: 30 (distant recurrence at 9 years with hormone therapy alone: 23%  Treatment plan: 1.Adjuvant chemotherapy with dose dense Adriamycin and Cytoxan x4 followed by Taxol x12 2.adjuvant antiestrogen therapy ----------------------------------------------------------------------------------------------------------------------------------------- Current treatment: Completed 4 cycles ofdose dense Adriamycin and Cytoxan, Today is cycle 2 Taxol  Chemotoxicities: 1. Hair loss. 2. Anemia: Patient started at a baseline hemoglobin of 10.7 and today is7.8.  She is asymptomatic and therefore we will watch and monitor.  No blood transfusion yet. 3. Thrombocytopenia: Monitoringtoday it is 146 4. Hospitalization 05/07/2022-05/09/2022 (fever, tachycardia, CT chest negative for PEbut showed bilateral upper lung infiltratestreated with Rocephin and azithromycin discharged home on Levaquin) 5. Skin discoloration of her hands and nailbed discoloration as well 6. Body aches: all over the body. She is going to use ice for neuropathy prevention. 7.  Neutropenia: ANC 1.4: I reduce the dosage of Taxol to 60 mg per metered squared, okay to treat.  RTC weekly for Taxol and every other week for follow up with me

## 2022-06-26 NOTE — Progress Notes (Signed)
Per Dr. Lindi Adie- ok to treat today with a hemoglobin of 7.8 and ANC of 1.4.

## 2022-06-28 ENCOUNTER — Encounter: Payer: Self-pay | Admitting: Hematology and Oncology

## 2022-07-02 MED FILL — Dexamethasone Sodium Phosphate Inj 100 MG/10ML: INTRAMUSCULAR | Qty: 1 | Status: AC

## 2022-07-03 ENCOUNTER — Inpatient Hospital Stay: Payer: Self-pay

## 2022-07-03 ENCOUNTER — Other Ambulatory Visit: Payer: Self-pay

## 2022-07-03 ENCOUNTER — Telehealth: Payer: Self-pay | Admitting: Hematology and Oncology

## 2022-07-03 VITALS — BP 126/65 | HR 68 | Resp 17 | Wt 204.8 lb

## 2022-07-03 DIAGNOSIS — Z170 Estrogen receptor positive status [ER+]: Secondary | ICD-10-CM

## 2022-07-03 DIAGNOSIS — Z95828 Presence of other vascular implants and grafts: Secondary | ICD-10-CM

## 2022-07-03 LAB — CMP (CANCER CENTER ONLY)
ALT: 21 U/L (ref 0–44)
AST: 18 U/L (ref 15–41)
Albumin: 3.8 g/dL (ref 3.5–5.0)
Alkaline Phosphatase: 53 U/L (ref 38–126)
Anion gap: 5 (ref 5–15)
BUN: 16 mg/dL (ref 8–23)
CO2: 28 mmol/L (ref 22–32)
Calcium: 9.2 mg/dL (ref 8.9–10.3)
Chloride: 108 mmol/L (ref 98–111)
Creatinine: 0.82 mg/dL (ref 0.44–1.00)
GFR, Estimated: >60 mL/min (ref >60)
Glucose, Bld: 114 mg/dL — ABNORMAL HIGH (ref 70–99)
Potassium: 3.7 mmol/L (ref 3.5–5.1)
Sodium: 141 mmol/L (ref 135–145)
Total Bilirubin: 0.4 mg/dL (ref 0.3–1.2)
Total Protein: 6.5 g/dL (ref 6.5–8.1)

## 2022-07-03 LAB — CBC WITH DIFFERENTIAL (CANCER CENTER ONLY)
Abs Immature Granulocytes: 0.01 10*3/uL (ref 0.00–0.07)
Basophils Absolute: 0 10*3/uL (ref 0.0–0.1)
Basophils Relative: 1 %
Eosinophils Absolute: 0 10*3/uL (ref 0.0–0.5)
Eosinophils Relative: 2 %
HCT: 25.7 % — ABNORMAL LOW (ref 36.0–46.0)
Hemoglobin: 8.6 g/dL — ABNORMAL LOW (ref 12.0–15.0)
Immature Granulocytes: 0 %
Lymphocytes Relative: 19 %
Lymphs Abs: 0.5 10*3/uL — ABNORMAL LOW (ref 0.7–4.0)
MCH: 29.4 pg (ref 26.0–34.0)
MCHC: 33.5 g/dL (ref 30.0–36.0)
MCV: 87.7 fL (ref 80.0–100.0)
Monocytes Absolute: 0.4 10*3/uL (ref 0.1–1.0)
Monocytes Relative: 17 %
Neutro Abs: 1.5 10*3/uL — ABNORMAL LOW (ref 1.7–7.7)
Neutrophils Relative %: 61 %
Platelet Count: 183 10*3/uL (ref 150–400)
RBC: 2.93 MIL/uL — ABNORMAL LOW (ref 3.87–5.11)
RDW: 19.2 % — ABNORMAL HIGH (ref 11.5–15.5)
WBC Count: 2.5 10*3/uL — ABNORMAL LOW (ref 4.0–10.5)
nRBC: 0 % (ref 0.0–0.2)

## 2022-07-03 MED ORDER — SODIUM CHLORIDE 0.9 % IV SOLN
60.0000 mg/m2 | Freq: Once | INTRAVENOUS | Status: AC
  Administered 2022-07-03: 126 mg via INTRAVENOUS
  Filled 2022-07-03: qty 21

## 2022-07-03 MED ORDER — SODIUM CHLORIDE 0.9 % IV SOLN
10.0000 mg | Freq: Once | INTRAVENOUS | Status: AC
  Administered 2022-07-03: 10 mg via INTRAVENOUS
  Filled 2022-07-03: qty 10

## 2022-07-03 MED ORDER — SODIUM CHLORIDE 0.9% FLUSH
10.0000 mL | Freq: Once | INTRAVENOUS | Status: AC
  Administered 2022-07-03: 10 mL

## 2022-07-03 MED ORDER — FAMOTIDINE IN NACL 20-0.9 MG/50ML-% IV SOLN
20.0000 mg | Freq: Once | INTRAVENOUS | Status: AC
  Administered 2022-07-03: 20 mg via INTRAVENOUS
  Filled 2022-07-03: qty 50

## 2022-07-03 MED ORDER — DIPHENHYDRAMINE HCL 50 MG/ML IJ SOLN
25.0000 mg | Freq: Once | INTRAMUSCULAR | Status: AC
  Administered 2022-07-03: 25 mg via INTRAVENOUS
  Filled 2022-07-03: qty 1

## 2022-07-03 MED ORDER — SODIUM CHLORIDE 0.9 % IV SOLN: Freq: Once | INTRAVENOUS | Status: AC

## 2022-07-03 MED ORDER — SODIUM CHLORIDE 0.9% FLUSH
10.0000 mL | INTRAVENOUS | Status: DC | PRN
  Administered 2022-07-03: 10 mL

## 2022-07-03 NOTE — Telephone Encounter (Signed)
Per 7/13 phone line pt called and said she unable to make her appointment this morning.  She requested to reschedule to later time today.  R/s per pt request

## 2022-07-09 MED FILL — Dexamethasone Sodium Phosphate Inj 100 MG/10ML: INTRAMUSCULAR | Qty: 1 | Status: AC

## 2022-07-10 ENCOUNTER — Inpatient Hospital Stay: Payer: Self-pay

## 2022-07-10 ENCOUNTER — Inpatient Hospital Stay (HOSPITAL_BASED_OUTPATIENT_CLINIC_OR_DEPARTMENT_OTHER): Payer: Self-pay | Admitting: Hematology and Oncology

## 2022-07-10 DIAGNOSIS — C50212 Malignant neoplasm of upper-inner quadrant of left female breast: Secondary | ICD-10-CM

## 2022-07-10 DIAGNOSIS — Z170 Estrogen receptor positive status [ER+]: Secondary | ICD-10-CM

## 2022-07-10 DIAGNOSIS — Z95828 Presence of other vascular implants and grafts: Secondary | ICD-10-CM

## 2022-07-10 LAB — CBC WITH DIFFERENTIAL/PLATELET
Abs Immature Granulocytes: 0.01 10*3/uL (ref 0.00–0.07)
Basophils Absolute: 0 10*3/uL (ref 0.0–0.1)
Basophils Relative: 1 %
Eosinophils Absolute: 0.1 10*3/uL (ref 0.0–0.5)
Eosinophils Relative: 4 %
HCT: 25 % — ABNORMAL LOW (ref 36.0–46.0)
Hemoglobin: 8.4 g/dL — ABNORMAL LOW (ref 12.0–15.0)
Immature Granulocytes: 0 %
Lymphocytes Relative: 24 %
Lymphs Abs: 0.6 10*3/uL — ABNORMAL LOW (ref 0.7–4.0)
MCH: 29.8 pg (ref 26.0–34.0)
MCHC: 33.6 g/dL (ref 30.0–36.0)
MCV: 88.7 fL (ref 80.0–100.0)
Monocytes Absolute: 0.5 10*3/uL (ref 0.1–1.0)
Monocytes Relative: 20 %
Neutro Abs: 1.2 10*3/uL — ABNORMAL LOW (ref 1.7–7.7)
Neutrophils Relative %: 51 %
Platelets: 154 10*3/uL (ref 150–400)
RBC: 2.82 MIL/uL — ABNORMAL LOW (ref 3.87–5.11)
RDW: 19.5 % — ABNORMAL HIGH (ref 11.5–15.5)
WBC: 2.3 10*3/uL — ABNORMAL LOW (ref 4.0–10.5)
nRBC: 0 % (ref 0.0–0.2)

## 2022-07-10 LAB — COMPREHENSIVE METABOLIC PANEL
ALT: 21 U/L (ref 0–44)
AST: 19 U/L (ref 15–41)
Albumin: 3.8 g/dL (ref 3.5–5.0)
Alkaline Phosphatase: 56 U/L (ref 38–126)
Anion gap: 3 — ABNORMAL LOW (ref 5–15)
BUN: 15 mg/dL (ref 8–23)
CO2: 28 mmol/L (ref 22–32)
Calcium: 9.3 mg/dL (ref 8.9–10.3)
Chloride: 108 mmol/L (ref 98–111)
Creatinine, Ser: 0.82 mg/dL (ref 0.44–1.00)
GFR, Estimated: >60 mL/min (ref >60)
Glucose, Bld: 96 mg/dL (ref 70–99)
Potassium: 3.8 mmol/L (ref 3.5–5.1)
Sodium: 139 mmol/L (ref 135–145)
Total Bilirubin: 0.4 mg/dL (ref 0.3–1.2)
Total Protein: 6.4 g/dL — ABNORMAL LOW (ref 6.5–8.1)

## 2022-07-10 MED ORDER — SODIUM CHLORIDE 0.9 % IV SOLN
10.0000 mg | Freq: Once | INTRAVENOUS | Status: AC
  Administered 2022-07-10: 10 mg via INTRAVENOUS
  Filled 2022-07-10: qty 10

## 2022-07-10 MED ORDER — SODIUM CHLORIDE 0.9 % IV SOLN: Freq: Once | INTRAVENOUS | Status: AC

## 2022-07-10 MED ORDER — SODIUM CHLORIDE 0.9% FLUSH
10.0000 mL | Freq: Once | INTRAVENOUS | Status: AC
  Administered 2022-07-10: 10 mL

## 2022-07-10 MED ORDER — DIPHENHYDRAMINE HCL 50 MG/ML IJ SOLN
25.0000 mg | Freq: Once | INTRAMUSCULAR | Status: AC
  Administered 2022-07-10: 25 mg via INTRAVENOUS
  Filled 2022-07-10: qty 1

## 2022-07-10 MED ORDER — SODIUM CHLORIDE 0.9 % IV SOLN
45.0000 mg/m2 | Freq: Once | INTRAVENOUS | Status: AC
  Administered 2022-07-10: 96 mg via INTRAVENOUS
  Filled 2022-07-10: qty 16

## 2022-07-10 MED ORDER — SODIUM CHLORIDE 0.9% FLUSH
10.0000 mL | INTRAVENOUS | Status: DC | PRN
  Administered 2022-07-10: 10 mL

## 2022-07-10 MED ORDER — FAMOTIDINE IN NACL 20-0.9 MG/50ML-% IV SOLN
20.0000 mg | Freq: Once | INTRAVENOUS | Status: AC
  Administered 2022-07-10: 20 mg via INTRAVENOUS
  Filled 2022-07-10: qty 50

## 2022-07-10 NOTE — Progress Notes (Signed)
OK to treat today per Dr. Geralyn Flash note : "Today's ANC is 1.2.  I reduce the dosage of Taxol.  If in spite of that her counts do not recover then we will have to give her Neupogen prior to each of her future chemotherapy treatments.  Okay to treat with ANC 1.2 today"

## 2022-07-10 NOTE — Patient Instructions (Signed)
[facility] ONCOLOGY  Discharge Instructions: Thank you for choosing [facility] to provide your oncology and hematology care.   If you have a lab appointment with the [facility], please go directly to the [facility] and check in at the registration area.   Wear comfortable clothing and clothing appropriate for easy access to any Portacath or PICC line.   We strive to give you quality time with your provider. You may need to reschedule your appointment if you arrive late (15 or more minutes).  Arriving late affects you and other patients whose appointments are after yours.  Also, if you miss three or more appointments without notifying the office, you may be dismissed from the clinic at the provider's discretion.      For prescription refill requests, have your pharmacy contact our office and allow 72 hours for refills to be completed.    Today you received the following chemotherapy and/or immunotherapy agents: Taxol  (Paclitaxel)   To help prevent nausea and vomiting after your treatment, we encourage you to take your nausea medication as directed.  BELOW ARE SYMPTOMS THAT SHOULD BE REPORTED IMMEDIATELY: *FEVER GREATER THAN 100.4 F (38 C) OR HIGHER *CHILLS OR SWEATING *NAUSEA AND VOMITING THAT IS NOT CONTROLLED WITH YOUR NAUSEA MEDICATION *UNUSUAL SHORTNESS OF BREATH *UNUSUAL BRUISING OR BLEEDING *URINARY PROBLEMS (pain or burning when urinating, or frequent urination) *BOWEL PROBLEMS (unusual diarrhea, constipation, pain near the anus) TENDERNESS IN MOUTH AND THROAT WITH OR WITHOUT PRESENCE OF ULCERS (sore throat, sores in mouth, or a toothache) UNUSUAL RASH, SWELLING OR PAIN  UNUSUAL VAGINAL DISCHARGE OR ITCHING   Items with * indicate a potential emergency and should be followed up as soon as possible or go to the Emergency Department if any problems should occur.  Please show the CHEMOTHERAPY ALERT CARD or IMMUNOTHERAPY ALERT CARD at  check-in to the Emergency Department and triage nurse.  Should you have questions after your visit or need to cancel or reschedule your appointment, please contact [facility]  Dept: [phone]  and follow the prompts.  Office hours are 8:00 a.m. to 4:30 p.m. Monday - Friday. Please note that voicemails left after 4:00 p.m. may not be returned until the following business day.  We are closed weekends and major holidays. You have access to a nurse at all times for urgent questions. Please call the main number to the clinic Dept: [phone] and follow the prompts.   For any non-urgent questions, you may also contact your provider using MyChart. We now offer e-Visits for anyone 39 and older to request care online for non-urgent symptoms. For details visit [email or web address].   Also download the MyChart app! Go to the app store, search "MyChart", open the app, select [facility], and log in with your MyChart username and password.  Masks are optional in the cancer centers. If you would like for your care team to wear a mask while they are taking care of you, please let them know. For doctor visits, patients may have with them one support person who is at least 64 years old. At this time, visitors are not allowed in the infusion area.

## 2022-07-10 NOTE — Progress Notes (Signed)
Patient Care Team: Pcp, No as PCP - General [name], RN as Oncology Nurse Navigator [name], RN as Oncology Nurse Navigator [name], MD as Consulting Physician (Hematology and Oncology)  DIAGNOSIS:  Encounter Diagnosis  Name Primary?   Malignant neoplasm of upper-inner quadrant of left breast in female, estrogen receptor positive ([place])     SUMMARY OF ONCOLOGIC HISTORY: Oncology History  Malignant neoplasm of upper-inner quadrant of left breast in female, estrogen receptor positive ([place])  10/22/2021 Initial Diagnosis   Palpable left breast mass. Diagnostic mammogram and [place]: an irregular mass within the upper inner left breast, IDC 2.2 cm ER 99%, PR 99%, HER2 negative, Ki-67 50%.  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  Total area of involvement 6 cm   10/22/2021 Cancer Staging   Staging form: Breast, AJCC 8th Edition - Clinical stage from 10/22/2021: Stage IIA (cT2, cN0(f), cM0, G3, ER+, PR+, HER2-) - Signed by [name], MD on 01/02/2022 Stage prefix: Initial diagnosis Method of lymph node assessment: Core biopsy Histologic grading system: 3 grade system   11/04/2021 Breast MRI   2.5 cm irregular enhancing mass UIQ left breast IDC with DCIS.  Additional non-mass enhancement extends anterior and inferior to the mass 2.5 cm total span 7.1 cm.   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, focal high-grade DCIS, margins negative, 1/2 lymph nodes positive ER 99%, PR 99%, HER2 negative, Ki-67 50%   03/24/2022 Oncotype testing   Oncotype DX recurrence score: 30 (distant recurrence at 9 years with hormone therapy alone: 23%   04/10/2022 -  Chemotherapy   Patient is on Treatment Plan : BREAST ADJUVANT DOSE DENSE AC q14d / PACLitaxel q7d       CHIEF COMPLIANT: follow up cycle 4 Taxol  INTERVAL HISTORY: Madison Becker is a   66 y.o with the above mention. She  presents to the clinic today for a follow-up and treatment. States that she experience tingling in toes but it comes and going. Denies nausea or diarrhea. Her taste is manageable. Her appetite is good and she maintains her weight.  ALLERGIES:  is allergic to beef-derived products and pork-derived products.  MEDICATIONS:  Current Outpatient Medications  Medication Sig Dispense Refill   acetaminophen (TYLENOL) 500 MG tablet Take 1,000 mg by mouth every 6 (six) hours as needed for mild pain.     lidocaine-prilocaine (EMLA) cream Apply to affected area once 30 g 3   No current facility-administered medications for this visit.    PHYSICAL EXAMINATION: ECOG PERFORMANCE STATUS: 1 - Symptomatic but completely ambulatory  Vitals:   07/10/22 1005  BP: (!) 147/78  Pulse: 77  Resp: 18  Temp: (!) 97.5 F (36.4 C)  SpO2: 100%   Filed Weights   07/10/22 1005  Weight: 207 lb 8 oz (94.1 kg)      LABORATORY DATA:  I have reviewed the data as listed    Latest Ref Rng & Units 07/10/2022    9:25 AM 07/03/2022   12:03 PM 06/26/2022   10:42 AM  CMP  Glucose 70 - 99 mg/dL 96  114  100   BUN 8 - 23 mg/dL '15  16  11   ' Creatinine 0.44 - 1.00 mg/dL 0.82  0.82  1.13   Sodium 135 - 145 mmol/L 139  141  141   Potassium 3.5 - 5.1 mmol/L 3.8  3.7  4.2   Chloride 98 - 111 mmol/L 108  108  109   CO2 22 - 32 mmol/L '28  28  28   ' Calcium 8.9 - 10.3 mg/dL 9.3  9.2  9.3   Total Protein 6.5 - 8.1 g/dL 6.4  6.5  6.5   Total Bilirubin 0.3 - 1.2 mg/dL 0.4  0.4  0.3   Alkaline Phos 38 - 126 U/L 56  53  53   AST 15 - 41 U/L '19  18  17   ' ALT 0 - 44 U/L '21  21  17     ' Lab Results  Component Value Date   WBC 2.3 (L) 07/10/2022   HGB 8.4 (L) 07/10/2022   HCT 25.0 (L) 07/10/2022   MCV 88.7 07/10/2022   PLT 154 07/10/2022   NEUTROABS 1.2 (L) 07/10/2022    ASSESSMENT & PLAN:  Malignant neoplasm of upper-inner quadrant of left breast in female, estrogen receptor positive ([place]) 03/05/2022:Left mastectomy:  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  Adjuvant chemotherapy with dose dense Adriamycin and Cytoxan x4 followed by Taxol x12 2. adjuvant antiestrogen therapy ----------------------------------------------------------------------------------------------------------------------------------------- Current treatment: Completed 4 cycles of dose dense Adriamycin and Cytoxan, Today is cycle 4 Taxol   Chemotoxicities: Hair loss. Anemia: Patient started at a baseline hemoglobin of 10.7 and today is . Thrombocytopenia: Monitoring today it is  Hospitalization 05/07/2022-05/09/2022 (fever, tachycardia, CT chest negative for PE but showed bilateral upper lung infiltrates treated with Rocephin and azithromycin discharged home on Levaquin) Skin discoloration of her hands and nailbed discoloration as well Body aches: all over the body. 7.  Neutropenia: Today's ANC is 1.2.  I reduce the dosage of Taxol.  If in spite of that her counts do not recover then we will have to give her Neupogen prior to each of her future chemotherapy treatments.  Okay to treat with ANC 1.2 today   She is going to use ice for neuropathy prevention.  RTC weekly for Taxol and every other week for follow up with me   No orders of the defined types were placed in this encounter.  The patient has a good understanding of the overall plan. she agrees with it. she will call with any problems that may develop before the next visit here. Total time spent: 30 mins including face to face time and time spent for planning, charting and co-ordination of care   [name], MD 07/10/22    I Gardiner Coins am scribing for Dr. Lindi Adie  I have reviewed the above documentation for accuracy and completeness, and I agree with the  above.

## 2022-07-10 NOTE — Assessment & Plan Note (Addendum)
03/05/2022:Left mastectomy: Grade 3 IDC 2.7 cm, focal high-grade DCIS, margins negative, 1/2 lymph nodes positive ER 99%, PR 99%, HER2 negative, Ki-67 50% 03/24/2022:Oncotype DX recurrence score: 30 (distant recurrence at 9 years with hormone therapy alone: 23%  Treatment plan: 1.Adjuvant chemotherapy with dose dense Adriamycin and Cytoxan x4 followed by Taxol x12 2.adjuvant antiestrogen therapy ----------------------------------------------------------------------------------------------------------------------------------------- Current treatment:Completed 4 cycles ofdose dense Adriamycin and Cytoxan, Today is cycle 4 Taxol  Chemotoxicities: 1. Hair loss. 2. Anemia: Patient started at a baseline hemoglobin of 10.7 and today is. 3. Thrombocytopenia: Monitoringtoday it is  4. Hospitalization 05/07/2022-05/09/2022 (fever, tachycardia, CT chest negative for PEbut showed bilateral upper lung infiltratestreated with Rocephin and azithromycin discharged home on Levaquin) 5. Skin discoloration of her hands and nailbed discoloration as well 6. Body aches: all over the body. 7. 7.  Neutropenia: Today's ANC is 1.2.  I reduce the dosage of Taxol.  If in spite of that her counts do not recover then we will have to give her Neupogen prior to each of her future chemotherapy treatments.   She is going to use ice for neuropathy prevention.  RTC weekly for Taxol and every other week for follow up with me

## 2022-07-14 ENCOUNTER — Other Ambulatory Visit: Payer: Self-pay

## 2022-07-16 MED FILL — Dexamethasone Sodium Phosphate Inj 100 MG/10ML: INTRAMUSCULAR | Qty: 1 | Status: AC

## 2022-07-17 ENCOUNTER — Inpatient Hospital Stay: Payer: Self-pay

## 2022-07-17 ENCOUNTER — Inpatient Hospital Stay: Payer: Self-pay | Admitting: Dietician

## 2022-07-17 ENCOUNTER — Other Ambulatory Visit: Payer: Self-pay

## 2022-07-17 VITALS — BP 152/88 | HR 71 | Temp 98.2°F | Resp 16 | Ht 64.0 in | Wt 205.8 lb

## 2022-07-17 DIAGNOSIS — Z170 Estrogen receptor positive status [ER+]: Secondary | ICD-10-CM

## 2022-07-17 DIAGNOSIS — Z95828 Presence of other vascular implants and grafts: Secondary | ICD-10-CM

## 2022-07-17 LAB — COMPREHENSIVE METABOLIC PANEL
ALT: 25 U/L (ref 0–44)
AST: 21 U/L (ref 15–41)
Albumin: 3.7 g/dL (ref 3.5–5.0)
Alkaline Phosphatase: 59 U/L (ref 38–126)
Anion gap: 4 — ABNORMAL LOW (ref 5–15)
BUN: 12 mg/dL (ref 8–23)
CO2: 28 mmol/L (ref 22–32)
Calcium: 8.9 mg/dL (ref 8.9–10.3)
Chloride: 108 mmol/L (ref 98–111)
Creatinine, Ser: 0.83 mg/dL (ref 0.44–1.00)
GFR, Estimated: >60 mL/min (ref >60)
Glucose, Bld: 100 mg/dL — ABNORMAL HIGH (ref 70–99)
Potassium: 3.9 mmol/L (ref 3.5–5.1)
Sodium: 140 mmol/L (ref 135–145)
Total Bilirubin: 0.5 mg/dL (ref 0.3–1.2)
Total Protein: 6.5 g/dL (ref 6.5–8.1)

## 2022-07-17 LAB — CBC WITH DIFFERENTIAL/PLATELET
Abs Immature Granulocytes: 0.01 10*3/uL (ref 0.00–0.07)
Basophils Absolute: 0 10*3/uL (ref 0.0–0.1)
Basophils Relative: 0 %
Eosinophils Absolute: 0.1 10*3/uL (ref 0.0–0.5)
Eosinophils Relative: 4 %
HCT: 26 % — ABNORMAL LOW (ref 36.0–46.0)
Hemoglobin: 8.7 g/dL — ABNORMAL LOW (ref 12.0–15.0)
Immature Granulocytes: 0 %
Lymphocytes Relative: 17 %
Lymphs Abs: 0.4 10*3/uL — ABNORMAL LOW (ref 0.7–4.0)
MCH: 30.3 pg (ref 26.0–34.0)
MCHC: 33.5 g/dL (ref 30.0–36.0)
MCV: 90.6 fL (ref 80.0–100.0)
Monocytes Absolute: 0.5 10*3/uL (ref 0.1–1.0)
Monocytes Relative: 20 %
Neutro Abs: 1.3 10*3/uL — ABNORMAL LOW (ref 1.7–7.7)
Neutrophils Relative %: 59 %
Platelets: 147 10*3/uL — ABNORMAL LOW (ref 150–400)
RBC: 2.87 MIL/uL — ABNORMAL LOW (ref 3.87–5.11)
RDW: 19.6 % — ABNORMAL HIGH (ref 11.5–15.5)
WBC: 2.3 10*3/uL — ABNORMAL LOW (ref 4.0–10.5)
nRBC: 0 % (ref 0.0–0.2)

## 2022-07-17 MED ORDER — SODIUM CHLORIDE 0.9 % IV SOLN
10.0000 mg | Freq: Once | INTRAVENOUS | Status: AC
  Administered 2022-07-17: 10 mg via INTRAVENOUS
  Filled 2022-07-17: qty 10

## 2022-07-17 MED ORDER — SODIUM CHLORIDE 0.9% FLUSH
10.0000 mL | INTRAVENOUS | Status: DC | PRN
  Administered 2022-07-17: 10 mL

## 2022-07-17 MED ORDER — FAMOTIDINE IN NACL 20-0.9 MG/50ML-% IV SOLN
20.0000 mg | Freq: Once | INTRAVENOUS | Status: AC
  Administered 2022-07-17: 20 mg via INTRAVENOUS
  Filled 2022-07-17: qty 50

## 2022-07-17 MED ORDER — DIPHENHYDRAMINE HCL 50 MG/ML IJ SOLN
25.0000 mg | Freq: Once | INTRAMUSCULAR | Status: AC
  Administered 2022-07-17: 25 mg via INTRAVENOUS
  Filled 2022-07-17: qty 1

## 2022-07-17 MED ORDER — SODIUM CHLORIDE 0.9 % IV SOLN
45.0000 mg/m2 | Freq: Once | INTRAVENOUS | Status: AC
  Administered 2022-07-17: 96 mg via INTRAVENOUS
  Filled 2022-07-17: qty 16

## 2022-07-17 MED ORDER — SODIUM CHLORIDE 0.9 % IV SOLN: Freq: Once | INTRAVENOUS | Status: AC

## 2022-07-17 MED ORDER — SODIUM CHLORIDE 0.9% FLUSH
10.0000 mL | Freq: Once | INTRAVENOUS | Status: AC
  Administered 2022-07-17: 10 mL

## 2022-07-17 NOTE — Progress Notes (Signed)
Nutrition Assessment   Reason for Assessment: MST   ASSESSMENT: 66 year old female with breast cancer. S/p left mastectomy 03/05/22. She is receiving dose dense paclitaxel q14d. Patient is under the care of Dr. Lindi Adie.  5/17-5/19 hospital admission with fever and found to have bilateral lung infiltrates  Met with patient in infusion. She reports appetite is up and down. Patient is typically "very hungry" in the mornings. Occasionally she will wake up 11-12 AM feeling this way as well. Patient reports foods taste bitter. Her mouth is dry. This makes eating some foods difficult. She is forcing herself to eat. Patient endorses "great" energy level. Says she wakes at 6 AM and enjoys gardening. Patient denies nausea, vomiting, diarrhea, constipation.    Nutrition Focused Physical Exam: deferred    Medications: reviewed   Labs: reviewed    Anthropometrics:   Height: 5'4" Weight: 205 lb 12.8 oz UBW: 213 lb (5/17) BMI: 35.33   NUTRITION DIAGNOSIS: Food and nutrition related knowledge deficit related to cancer as evidenced by no prior need for related nutrition information    INTERVENTION:  Educated on importance of adequate calorie and protein energy intake to maintain weights/strength Discussed strategies for altered taste and dry mouth - handout with tips provided Recommend baking soda salt water rinses several times daily before meals - recipe provided  Encouraged small frequent meals and snacks - handout with ideas provided    MONITORING, EVALUATION, GOAL: Patient will tolerate increased calories and protein to minimize weight loss    Next Visit: To be scheduled as needed

## 2022-07-17 NOTE — Patient Instructions (Signed)
[facility] ONCOLOGY   Discharge Instructions: Thank you for choosing [facility] to provide your oncology and hematology care.   If you have a lab appointment with the [facility], please go directly to the [facility] and check in at the registration area.   Wear comfortable clothing and clothing appropriate for easy access to any Portacath or PICC line.   We strive to give you quality time with your provider. You may need to reschedule your appointment if you arrive late (15 or more minutes).  Arriving late affects you and other patients whose appointments are after yours.  Also, if you miss three or more appointments without notifying the office, you may be dismissed from the clinic at the provider's discretion.      For prescription refill requests, have your pharmacy contact our office and allow 72 hours for refills to be completed.    Today you received the following chemotherapy and/or immunotherapy agents: paclitaxel      To help prevent nausea and vomiting after your treatment, we encourage you to take your nausea medication as directed.  BELOW ARE SYMPTOMS THAT SHOULD BE REPORTED IMMEDIATELY: *FEVER GREATER THAN 100.4 F (38 C) OR HIGHER *CHILLS OR SWEATING *NAUSEA AND VOMITING THAT IS NOT CONTROLLED WITH YOUR NAUSEA MEDICATION *UNUSUAL SHORTNESS OF BREATH *UNUSUAL BRUISING OR BLEEDING *URINARY PROBLEMS (pain or burning when urinating, or frequent urination) *BOWEL PROBLEMS (unusual diarrhea, constipation, pain near the anus) TENDERNESS IN MOUTH AND THROAT WITH OR WITHOUT PRESENCE OF ULCERS (sore throat, sores in mouth, or a toothache) UNUSUAL RASH, SWELLING OR PAIN  UNUSUAL VAGINAL DISCHARGE OR ITCHING   Items with * indicate a potential emergency and should be followed up as soon as possible or go to the Emergency Department if any problems should occur.  Please show the CHEMOTHERAPY ALERT CARD or IMMUNOTHERAPY ALERT CARD at check-in  to the Emergency Department and triage nurse.  Should you have questions after your visit or need to cancel or reschedule your appointment, please contact [facility]  Dept: [phone]  and follow the prompts.  Office hours are 8:00 a.m. to 4:30 p.m. Monday - Friday. Please note that voicemails left after 4:00 p.m. may not be returned until the following business day.  We are closed weekends and major holidays. You have access to a nurse at all times for urgent questions. Please call the main number to the clinic Dept: [phone] and follow the prompts.   For any non-urgent questions, you may also contact your provider using MyChart. We now offer e-Visits for anyone 24 and older to request care online for non-urgent symptoms. For details visit [email or web address].   Also download the MyChart app! Go to the app store, search "MyChart", open the app, select [facility], and log in with your MyChart username and password.  Masks are optional in the cancer centers. If you would like for your care team to wear a mask while they are taking care of you, please let them know. For doctor visits, patients may have with them one support person who is at least 64 years old. At this time, visitors are not allowed in the infusion area.

## 2022-07-18 NOTE — Progress Notes (Signed)
Patient Care Team: Pcp, No as PCP - General Madison Kaufmann, RN as Oncology Nurse Navigator Madison Germany, RN as Oncology Nurse Navigator Madison Lose, MD as Consulting Physician (Hematology and Oncology)  DIAGNOSIS: No diagnosis found.  SUMMARY OF ONCOLOGIC HISTORY: Oncology History  Malignant neoplasm of upper-inner quadrant of left breast in female, estrogen receptor positive ([place])  10/22/2021 Initial Diagnosis   Palpable left breast mass. Diagnostic mammogram and [place]: an irregular mass within the upper inner left breast, IDC 2.2 cm ER 99%, PR 99%, HER2 negative, Ki-67 50%.  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  Total area of involvement 6 cm   10/22/2021 Cancer Staging   Staging form: Breast, AJCC 8th Edition - Clinical stage from 10/22/2021: Stage IIA (cT2, cN0(f), cM0, G3, ER+, PR+, HER2-) - Signed by Madison Lose, MD on 01/02/2022 Stage prefix: Initial diagnosis Method of lymph node assessment: Core biopsy Histologic grading system: 3 grade system   11/04/2021 Breast MRI   2.5 cm irregular enhancing mass UIQ left breast IDC with DCIS.  Additional non-mass enhancement extends anterior and inferior to the mass 2.5 cm total span 7.1 cm.   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, focal high-grade DCIS, margins negative, 1/2 lymph nodes positive ER 99%, PR 99%, HER2 negative, Ki-67 50%   03/24/2022 Oncotype testing   Oncotype DX recurrence score: 30 (distant recurrence at 9 years with hormone therapy alone: 23%   04/10/2022 -  Chemotherapy   Patient is on Treatment Plan : BREAST ADJUVANT DOSE DENSE AC q14d / PACLitaxel q7d       CHIEF COMPLIANT: follow up cycle 6 Taxol    INTERVAL HISTORY: Madison Becker is a 66  y.o with the above mention. She presents to the clinic today for a follow-up and treatment.   ALLERGIES:  is allergic to beef-derived products and pork-derived  products.  MEDICATIONS:  Current Outpatient Medications  Medication Sig Dispense Refill   acetaminophen (TYLENOL) 500 MG tablet Take 1,000 mg by mouth every 6 (six) hours as needed for mild pain.     lidocaine-prilocaine (EMLA) cream Apply to affected area once 30 g 3   No current facility-administered medications for this visit.    PHYSICAL EXAMINATION: ECOG PERFORMANCE STATUS: {CHL ONC ECOG PS:[id number]}  There were no vitals filed for this visit. There were no vitals filed for this visit.  BREAST:*** No palpable masses or nodules in either right or left breasts. No palpable axillary supraclavicular or infraclavicular adenopathy no breast tenderness or nipple discharge. (exam performed in the presence of a chaperone)  LABORATORY DATA:  I have reviewed the data as listed    Latest Ref Rng & Units 07/17/2022    9:01 AM 07/10/2022    9:25 AM 07/03/2022   12:03 PM  CMP  Glucose 70 - 99 mg/dL 100  96  114   BUN 8 - 23 mg/dL '12  15  16   ' Creatinine 0.44 - 1.00 mg/dL 0.83  0.82  0.82   Sodium 135 - 145 mmol/L 140  139  141   Potassium 3.5 - 5.1 mmol/L 3.9  3.8  3.7   Chloride 98 - 111 mmol/L 108  108  108   CO2 22 - 32 mmol/L '28  28  28   ' Calcium 8.9 - 10.3 mg/dL 8.9  9.3  9.2   Total Protein 6.5 - 8.1 g/dL  6.5  6.4  6.5   Total Bilirubin 0.3 - 1.2 mg/dL 0.5  0.4  0.4   Alkaline Phos 38 - 126 U/L 59  56  53   AST 15 - 41 U/L '21  19  18   ' ALT 0 - 44 U/L '25  21  21     ' Lab Results  Component Value Date   WBC 2.3 (L) 07/17/2022   HGB 8.7 (L) 07/17/2022   HCT 26.0 (L) 07/17/2022   MCV 90.6 07/17/2022   PLT 147 (L) 07/17/2022   NEUTROABS 1.3 (L) 07/17/2022    ASSESSMENT & PLAN:  No problem-specific Assessment & Plan notes found for this encounter.    No orders of the defined types were placed in this encounter.  The patient has a good understanding of the overall plan. she agrees with it. she will call with any problems that may develop before the next visit  here. Total time spent: 30 mins including face to face time and time spent for planning, charting and co-ordination of care   Madison Becker, [place] 07/18/22    I Madison Becker am scribing for Dr. Lindi Becker  ***

## 2022-07-23 MED FILL — Dexamethasone Sodium Phosphate Inj 100 MG/10ML: INTRAMUSCULAR | Qty: 1 | Status: AC

## 2022-07-24 ENCOUNTER — Encounter: Payer: Self-pay | Admitting: *Deleted

## 2022-07-24 ENCOUNTER — Inpatient Hospital Stay: Payer: Self-pay

## 2022-07-24 ENCOUNTER — Other Ambulatory Visit: Payer: Self-pay

## 2022-07-24 ENCOUNTER — Inpatient Hospital Stay: Payer: Self-pay | Attending: Hematology and Oncology | Admitting: Hematology and Oncology

## 2022-07-24 VITALS — BP 125/84 | HR 76 | Resp 18

## 2022-07-24 DIAGNOSIS — Z79899 Other long term (current) drug therapy: Secondary | ICD-10-CM | POA: Insufficient documentation

## 2022-07-24 DIAGNOSIS — C50212 Malignant neoplasm of upper-inner quadrant of left female breast: Secondary | ICD-10-CM | POA: Insufficient documentation

## 2022-07-24 DIAGNOSIS — Z5111 Encounter for antineoplastic chemotherapy: Secondary | ICD-10-CM | POA: Insufficient documentation

## 2022-07-24 DIAGNOSIS — Z95828 Presence of other vascular implants and grafts: Secondary | ICD-10-CM

## 2022-07-24 DIAGNOSIS — Z170 Estrogen receptor positive status [ER+]: Secondary | ICD-10-CM

## 2022-07-24 LAB — CBC WITH DIFFERENTIAL/PLATELET
Abs Immature Granulocytes: 0.01 10*3/uL (ref 0.00–0.07)
Basophils Absolute: 0 10*3/uL (ref 0.0–0.1)
Basophils Relative: 1 %
Eosinophils Absolute: 0.1 10*3/uL (ref 0.0–0.5)
Eosinophils Relative: 6 %
HCT: 27.5 % — ABNORMAL LOW (ref 36.0–46.0)
Hemoglobin: 9 g/dL — ABNORMAL LOW (ref 12.0–15.0)
Immature Granulocytes: 0 %
Lymphocytes Relative: 24 %
Lymphs Abs: 0.6 10*3/uL — ABNORMAL LOW (ref 0.7–4.0)
MCH: 29.8 pg (ref 26.0–34.0)
MCHC: 32.7 g/dL (ref 30.0–36.0)
MCV: 91.1 fL (ref 80.0–100.0)
Monocytes Absolute: 0.5 10*3/uL (ref 0.1–1.0)
Monocytes Relative: 19 %
Neutro Abs: 1.2 10*3/uL — ABNORMAL LOW (ref 1.7–7.7)
Neutrophils Relative %: 50 %
Platelets: 138 10*3/uL — ABNORMAL LOW (ref 150–400)
RBC: 3.02 MIL/uL — ABNORMAL LOW (ref 3.87–5.11)
RDW: 18.9 % — ABNORMAL HIGH (ref 11.5–15.5)
WBC: 2.5 10*3/uL — ABNORMAL LOW (ref 4.0–10.5)
nRBC: 0 % (ref 0.0–0.2)

## 2022-07-24 LAB — COMPREHENSIVE METABOLIC PANEL
ALT: 29 U/L (ref 0–44)
AST: 24 U/L (ref 15–41)
Albumin: 4.1 g/dL (ref 3.5–5.0)
Alkaline Phosphatase: 56 U/L (ref 38–126)
Anion gap: 5 (ref 5–15)
BUN: 17 mg/dL (ref 8–23)
CO2: 27 mmol/L (ref 22–32)
Calcium: 9 mg/dL (ref 8.9–10.3)
Chloride: 108 mmol/L (ref 98–111)
Creatinine, Ser: 0.9 mg/dL (ref 0.44–1.00)
GFR, Estimated: >60 mL/min (ref >60)
Glucose, Bld: 94 mg/dL (ref 70–99)
Potassium: 3.8 mmol/L (ref 3.5–5.1)
Sodium: 140 mmol/L (ref 135–145)
Total Bilirubin: 0.5 mg/dL (ref 0.3–1.2)
Total Protein: 6.8 g/dL (ref 6.5–8.1)

## 2022-07-24 MED ORDER — SODIUM CHLORIDE 0.9 % IV SOLN
45.0000 mg/m2 | Freq: Once | INTRAVENOUS | Status: AC
  Administered 2022-07-24: 96 mg via INTRAVENOUS
  Filled 2022-07-24: qty 16

## 2022-07-24 MED ORDER — SODIUM CHLORIDE 0.9% FLUSH
10.0000 mL | Freq: Once | INTRAVENOUS | Status: AC
  Administered 2022-07-24: 10 mL

## 2022-07-24 MED ORDER — SODIUM CHLORIDE 0.9% FLUSH
10.0000 mL | INTRAVENOUS | Status: DC | PRN
  Administered 2022-07-24: 10 mL

## 2022-07-24 MED ORDER — HEPARIN SOD (PORK) LOCK FLUSH 100 UNIT/ML IV SOLN
500.0000 [IU] | Freq: Once | INTRAVENOUS | Status: AC | PRN
  Administered 2022-07-24: 500 [IU]

## 2022-07-24 MED ORDER — SODIUM CHLORIDE 0.9 % IV SOLN: Freq: Once | INTRAVENOUS | Status: AC

## 2022-07-24 MED ORDER — SODIUM CHLORIDE 0.9 % IV SOLN
10.0000 mg | Freq: Once | INTRAVENOUS | Status: AC
  Administered 2022-07-24: 10 mg via INTRAVENOUS
  Filled 2022-07-24: qty 10

## 2022-07-24 MED ORDER — DIPHENHYDRAMINE HCL 50 MG/ML IJ SOLN
25.0000 mg | Freq: Once | INTRAMUSCULAR | Status: AC
  Administered 2022-07-24: 25 mg via INTRAVENOUS
  Filled 2022-07-24: qty 1

## 2022-07-24 MED ORDER — FAMOTIDINE IN NACL 20-0.9 MG/50ML-% IV SOLN
20.0000 mg | Freq: Once | INTRAVENOUS | Status: AC
  Administered 2022-07-24: 20 mg via INTRAVENOUS
  Filled 2022-07-24: qty 50

## 2022-07-24 NOTE — Assessment & Plan Note (Addendum)
03/05/2022:Left mastectomy: Grade 3 IDC 2.7 cm, focal high-grade DCIS, margins negative, 1/2 lymph nodes positive ER 99%, PR 99%, HER2 negative, Ki-67 50% 03/24/2022:Oncotype DX recurrence score: 30 (distant recurrence at 9 years with hormone therapy alone: 23%  Treatment plan: 1.Adjuvant chemotherapy with dose dense Adriamycin and Cytoxan x4 followed by Taxol x12 2.adjuvant antiestrogen therapy ----------------------------------------------------------------------------------------------------------------------------------------- Current treatment:Completed 4 cycles ofdose dense Adriamycin and Cytoxan, Today is cycle6Taxol  Chemotoxicities: 1. Hair loss. 2. Anemia: Patient started at a baseline hemoglobin of 10.7 and today is9 3. Thrombocytopenia: Monitoringtoday it is 138 4. Hospitalization 05/07/2022-05/09/2022 (fever, tachycardia, CT chest negative for PEbut showed bilateral upper lung infiltratestreated with Rocephin and azithromycin discharged home on Levaquin) 5. Skin discoloration of her hands and nailbed discoloration as well 6. Body aches: all over the body. 7. 7.  Neutropenia: Today's ANC is 1.2.  I reduced the dosage of Taxol.  Okay to treat with ANC 1.2 today 8. Chemo-induced peripheral neuropathy: Grade 1: Monitoring   She is going to use ice for neuropathy prevention.  RTC weekly for Taxol and every other week for follow up with me

## 2022-07-24 NOTE — Patient Instructions (Signed)
[facility]  Discharge Instructions: Thank you for choosing [facility] to provide your oncology and hematology care.   If you have a lab appointment with the Cancer Center, please go directly to the Cancer Center and check in at the registration area.   Wear comfortable clothing and clothing appropriate for easy access to any Portacath or PICC line.   We strive to give you quality time with your provider. You may need to reschedule your appointment if you arrive late (15 or more minutes).  Arriving late affects you and other patients whose appointments are after yours.  Also, if you miss three or more appointments without notifying the office, you may be dismissed from the clinic at the provider's discretion.      For prescription refill requests, have your pharmacy contact our office and allow 72 hours for refills to be completed.    Today you received the following chemotherapy and/or immunotherapy agents: Paclitaxel.       To help prevent nausea and vomiting after your treatment, we encourage you to take your nausea medication as directed.  BELOW ARE SYMPTOMS THAT SHOULD BE REPORTED IMMEDIATELY: *FEVER GREATER THAN 100.4 F (38 C) OR HIGHER *CHILLS OR SWEATING *NAUSEA AND VOMITING THAT IS NOT CONTROLLED WITH YOUR NAUSEA MEDICATION *UNUSUAL SHORTNESS OF BREATH *UNUSUAL BRUISING OR BLEEDING *URINARY PROBLEMS (pain or burning when urinating, or frequent urination) *BOWEL PROBLEMS (unusual diarrhea, constipation, pain near the anus) TENDERNESS IN MOUTH AND THROAT WITH OR WITHOUT PRESENCE OF ULCERS (sore throat, sores in mouth, or a toothache) UNUSUAL RASH, SWELLING OR PAIN  UNUSUAL VAGINAL DISCHARGE OR ITCHING   Items with * indicate a potential emergency and should be followed up as soon as possible or go to the Emergency Department if any problems should occur.  Please show the CHEMOTHERAPY ALERT CARD or IMMUNOTHERAPY ALERT CARD at check-in  to the Emergency Department and triage nurse.  Should you have questions after your visit or need to cancel or reschedule your appointment, please contact [facility]  Dept: [phone]  and follow the prompts.  Office hours are 8:00 a.m. to 4:30 p.m. Monday - Friday. Please note that voicemails left after 4:00 p.m. may not be returned until the following business day.  We are closed weekends and major holidays. You have access to a nurse at all times for urgent questions. Please call the main number to the clinic Dept: [phone] and follow the prompts.   For any non-urgent questions, you may also contact your provider using MyChart. We now offer e-Visits for anyone 18 and older to request care online for non-urgent symptoms. For details visit [email or web address].   Also download the MyChart app! Go to the app store, search "MyChart", open the app, select , and log in with your MyChart username and password.  Masks are optional in the cancer centers. If you would like for your care team to wear a mask while they are taking care of you, please let them know. You may have one support person who is at least 64 years old accompany you for your appointments. 

## 2022-07-25 ENCOUNTER — Encounter: Payer: Self-pay | Admitting: Hematology and Oncology

## 2022-07-25 ENCOUNTER — Encounter: Payer: Self-pay | Admitting: *Deleted

## 2022-07-30 MED FILL — Dexamethasone Sodium Phosphate Inj 100 MG/10ML: INTRAMUSCULAR | Qty: 1 | Status: AC

## 2022-07-31 ENCOUNTER — Inpatient Hospital Stay: Payer: Self-pay

## 2022-07-31 ENCOUNTER — Other Ambulatory Visit: Payer: Self-pay

## 2022-07-31 VITALS — BP 119/77 | HR 60 | Temp 98.2°F | Resp 17 | Wt 203.5 lb

## 2022-07-31 DIAGNOSIS — Z170 Estrogen receptor positive status [ER+]: Secondary | ICD-10-CM

## 2022-07-31 DIAGNOSIS — Z95828 Presence of other vascular implants and grafts: Secondary | ICD-10-CM

## 2022-07-31 LAB — COMPREHENSIVE METABOLIC PANEL
ALT: 26 U/L (ref 0–44)
AST: 20 U/L (ref 15–41)
Albumin: 3.7 g/dL (ref 3.5–5.0)
Alkaline Phosphatase: 54 U/L (ref 38–126)
Anion gap: 3 — ABNORMAL LOW (ref 5–15)
BUN: 14 mg/dL (ref 8–23)
CO2: 28 mmol/L (ref 22–32)
Calcium: 8.9 mg/dL (ref 8.9–10.3)
Chloride: 108 mmol/L (ref 98–111)
Creatinine, Ser: 0.84 mg/dL (ref 0.44–1.00)
GFR, Estimated: >60 mL/min (ref >60)
Glucose, Bld: 99 mg/dL (ref 70–99)
Potassium: 4 mmol/L (ref 3.5–5.1)
Sodium: 139 mmol/L (ref 135–145)
Total Bilirubin: 0.4 mg/dL (ref 0.3–1.2)
Total Protein: 6.5 g/dL (ref 6.5–8.1)

## 2022-07-31 LAB — CBC WITH DIFFERENTIAL/PLATELET
Abs Immature Granulocytes: 0.01 10*3/uL (ref 0.00–0.07)
Basophils Absolute: 0 10*3/uL (ref 0.0–0.1)
Basophils Relative: 1 %
Eosinophils Absolute: 0.1 10*3/uL (ref 0.0–0.5)
Eosinophils Relative: 6 %
HCT: 26.5 % — ABNORMAL LOW (ref 36.0–46.0)
Hemoglobin: 9 g/dL — ABNORMAL LOW (ref 12.0–15.0)
Immature Granulocytes: 1 %
Lymphocytes Relative: 22 %
Lymphs Abs: 0.4 10*3/uL — ABNORMAL LOW (ref 0.7–4.0)
MCH: 30.8 pg (ref 26.0–34.0)
MCHC: 34 g/dL (ref 30.0–36.0)
MCV: 90.8 fL (ref 80.0–100.0)
Monocytes Absolute: 0.4 10*3/uL (ref 0.1–1.0)
Monocytes Relative: 20 %
Neutro Abs: 1 10*3/uL — ABNORMAL LOW (ref 1.7–7.7)
Neutrophils Relative %: 50 %
Platelets: 122 10*3/uL — ABNORMAL LOW (ref 150–400)
RBC: 2.92 MIL/uL — ABNORMAL LOW (ref 3.87–5.11)
RDW: 17.6 % — ABNORMAL HIGH (ref 11.5–15.5)
WBC: 1.9 10*3/uL — ABNORMAL LOW (ref 4.0–10.5)
nRBC: 0 % (ref 0.0–0.2)

## 2022-07-31 MED ORDER — FAMOTIDINE IN NACL 20-0.9 MG/50ML-% IV SOLN
INTRAVENOUS | Status: AC
  Filled 2022-07-31: qty 50

## 2022-07-31 MED ORDER — DIPHENHYDRAMINE HCL 50 MG/ML IJ SOLN
INTRAMUSCULAR | Status: AC
  Filled 2022-07-31: qty 1

## 2022-07-31 MED ORDER — SODIUM CHLORIDE 0.9 % IV SOLN: Freq: Once | INTRAVENOUS | Status: AC

## 2022-07-31 MED ORDER — DIPHENHYDRAMINE HCL 50 MG/ML IJ SOLN
25.0000 mg | Freq: Once | INTRAMUSCULAR | Status: AC
  Administered 2022-07-31: 25 mg via INTRAVENOUS

## 2022-07-31 MED ORDER — SODIUM CHLORIDE 0.9 % IV SOLN
45.0000 mg/m2 | Freq: Once | INTRAVENOUS | Status: AC
  Administered 2022-07-31: 96 mg via INTRAVENOUS
  Filled 2022-07-31: qty 16

## 2022-07-31 MED ORDER — SODIUM CHLORIDE 0.9 % IV SOLN
10.0000 mg | Freq: Once | INTRAVENOUS | Status: AC
  Administered 2022-07-31: 10 mg via INTRAVENOUS
  Filled 2022-07-31: qty 10

## 2022-07-31 MED ORDER — SODIUM CHLORIDE 0.9% FLUSH
10.0000 mL | Freq: Once | INTRAVENOUS | Status: AC
  Administered 2022-07-31: 10 mL

## 2022-07-31 MED ORDER — SODIUM CHLORIDE 0.9% FLUSH
10.0000 mL | INTRAVENOUS | Status: DC | PRN
  Administered 2022-07-31: 10 mL

## 2022-07-31 MED ORDER — FAMOTIDINE IN NACL 20-0.9 MG/50ML-% IV SOLN
20.0000 mg | Freq: Once | INTRAVENOUS | Status: AC
  Administered 2022-07-31: 20 mg via INTRAVENOUS

## 2022-07-31 NOTE — Patient Instructions (Signed)
[facility]ICAL [facility]COLOG[facility]   Discharge Instructions: Thank you for choosing [facility] to provide your oncology and hematology care.   If you have a lab appointment with the Cancer Center, please go directly to the Cancer Center and check in at the registration area.   Wear comfortable clothing and clothing appropriate for easy access to any Portacath or PICC line.   We strive to give you quality time with your provider. You may need to reschedule your appointment if you arrive late (15 or more minutes).  Arriving late affects you and other patients whose appointments are after yours.  Also, if you miss three or more appointments without notifying the office, you may be dismissed from the clinic at the provider's discretion.      For prescription refill requests, have your pharmacy contact our office and allow 72 hours for refills to be completed.    Today you received the following chemotherapy and/or immunotherapy agents: paclitaxel      To help prevent nausea and vomiting after your treatment, we encourage you to take your nausea medication as directed.  BELOW ARE SYMPTOMS THAT SHOULD BE REPORTED IMMEDIATELY: *FEVER GREATER THAN 100.4 F (38 C) OR HIGHER *CHILLS OR SWEATING *NAUSEA AND VOMITING THAT IS NOT CONTROLLED WITH YOUR NAUSEA MEDICATION *UNUSUAL SHORTNESS OF BREATH *UNUSUAL BRUISING OR BLEEDING *URINARY PROBLEMS (pain or burning when urinating, or frequent urination) *BOWEL PROBLEMS (unusual diarrhea, constipation, pain near the anus) TENDERNESS IN MOUTH AND THROAT WITH OR WITHOUT PRESENCE OF ULCERS (sore throat, sores in mouth, or a toothache) UNUSUAL RASH, SWELLING OR PAIN  UNUSUAL VAGINAL DISCHARGE OR ITCHING   Items with * indicate a potential emergency and should be followed up as soon as possible or go to the Emergency Department if any problems should occur.  Please show the CHEMOTHERAPY ALERT CARD or IMMUNOTHERAPY ALERT CARD at check-in  to the Emergency Department and triage nurse.  Should you have questions after your visit or need to cancel or reschedule your appointment, please contact  CANCER CENTER MEDICAL ONCOLOGY  Dept: [phone]  and follow the prompts.  Office hours are 8:00 a.m. to 4:30 p.m. Monday - Friday. Please note that voicemails left after 4:00 p.m. may not be returned until the following business day.  We are closed weekends and major holidays. You have access to a nurse at all times for urgent questions. Please call the main number to the clinic Dept: [phone] and follow the prompts.   For any non-urgent questions, you may also contact your provider using MyChart. We now offer e-Visits for anyone 18 and older to request care online for non-urgent symptoms. For details visit [email or web address].   Also download the MyChart app! Go to the app store, search "MyChart", open the app, select , and log in with your MyChart username and password.  Masks are optional in the cancer centers. If you would like for your care team to wear a mask while they are taking care of you, please let them know. You may have one support person who is at least 64 years old accompany you for your appointments. 

## 2022-08-06 MED FILL — Dexamethasone Sodium Phosphate Inj 100 MG/10ML: INTRAMUSCULAR | Qty: 1 | Status: AC

## 2022-08-06 NOTE — Progress Notes (Signed)
Patient Care Team: Pcp, No as PCP - General [name], RN as Oncology Nurse Navigator [name], RN as Oncology Nurse Navigator [name], MD as Consulting Physician (Hematology and Oncology)  DIAGNOSIS: No diagnosis found.  SUMMARY OF ONCOLOGIC HISTORY: Oncology History  Malignant neoplasm of upper-inner quadrant of left breast in female, estrogen receptor positive ([place])  10/22/2021 Initial Diagnosis   Palpable left breast mass. Diagnostic mammogram and [place]: an irregular mass within the upper inner left breast, IDC 2.2 cm ER 99%, PR 99%, HER2 negative, Ki-67 50%.  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  Total area of involvement 6 cm   10/22/2021 Cancer Staging   Staging form: Breast, AJCC 8th Edition - Clinical stage from 10/22/2021: Stage IIA (cT2, cN0(f), cM0, G3, ER+, PR+, HER2-) - Signed by [name], MD on 01/02/2022 Stage prefix: Initial diagnosis Method of lymph node assessment: Core biopsy Histologic grading system: 3 grade system   11/04/2021 Breast MRI   2.5 cm irregular enhancing mass UIQ left breast IDC with DCIS.  Additional non-mass enhancement extends anterior and inferior to the mass 2.5 cm total span 7.1 cm.   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, focal high-grade DCIS, margins negative, 1/2 lymph nodes positive ER 99%, PR 99%, HER2 negative, Ki-67 50%   03/24/2022 Oncotype testing   Oncotype DX recurrence score: 30 (distant recurrence at 9 years with hormone therapy alone: 23%   04/10/2022 -  Chemotherapy   Patient is on Treatment Plan : BREAST ADJUVANT DOSE DENSE AC q14d / PACLitaxel q7d       CHIEF COMPLIANT: follow up cycle 6 Taxol  INTERVAL HISTORY: Madison Becker is a 66  y.o with the above-mention. She presents to the clinic today for a follow-up and treatment.    ALLERGIES:  is allergic to beef-derived products and pork-derived  products.  MEDICATIONS:  Current Outpatient Medications  Medication Sig Dispense Refill   acetaminophen (TYLENOL) 500 MG tablet Take 1,000 mg by mouth every 6 (six) hours as needed for mild pain.     lidocaine-prilocaine (EMLA) cream Apply to affected area once 30 g 3   No current facility-administered medications for this visit.    PHYSICAL EXAMINATION: ECOG PERFORMANCE STATUS: {CHL ONC ECOG PS:[id number]}  There were no vitals filed for this visit. There were no vitals filed for this visit.  BREAST:*** No palpable masses or nodules in either right or left breasts. No palpable axillary supraclavicular or infraclavicular adenopathy no breast tenderness or nipple discharge. (exam performed in the presence of a chaperone)  LABORATORY DATA:  I have reviewed the data as listed    Latest Ref Rng & Units 07/31/2022    9:20 AM 07/24/2022    9:19 AM 07/17/2022    9:01 AM  CMP  Glucose 70 - 99 mg/dL 99  94  100   BUN 8 - 23 mg/dL '14  17  12   ' Creatinine 0.44 - 1.00 mg/dL 0.84  0.90  0.83   Sodium 135 - 145 mmol/L 139  140  140   Potassium 3.5 - 5.1 mmol/L 4.0  3.8  3.9   Chloride 98 - 111 mmol/L 108  108  108   CO2 22 - 32 mmol/L '28  27  28   ' Calcium 8.9 - 10.3 mg/dL 8.9  9.0  8.9   Total Protein 6.5 - 8.1 g/dL 6.5  6.8  6.5   Total Bilirubin 0.3 - 1.2 mg/dL 0.4  0.5  0.5   Alkaline Phos 38 - 126 U/L 54  56  59   AST 15 - 41 U/L '20  24  21   ' ALT 0 - 44 U/L '26  29  25     ' Lab Results  Component Value Date   WBC 1.9 (L) 07/31/2022   HGB 9.0 (L) 07/31/2022   HCT 26.5 (L) 07/31/2022   MCV 90.8 07/31/2022   PLT 122 (L) 07/31/2022   NEUTROABS 1.0 (L) 07/31/2022    ASSESSMENT & PLAN:  No problem-specific Assessment & Plan notes found for this encounter.    No orders of the defined types were placed in this encounter.  The patient has a good understanding of the overall plan. she agrees with it. she will call with any problems that may develop before the next visit here. Total  time spent: 30 mins including face to face time and time spent for planning, charting and co-ordination of care   [name], [place] 08/06/22    I Gardiner Coins am scribing for Dr. Lindi Adie  ***

## 2022-08-07 ENCOUNTER — Inpatient Hospital Stay: Payer: Self-pay

## 2022-08-07 ENCOUNTER — Other Ambulatory Visit: Payer: Self-pay

## 2022-08-07 ENCOUNTER — Inpatient Hospital Stay (HOSPITAL_BASED_OUTPATIENT_CLINIC_OR_DEPARTMENT_OTHER): Payer: Self-pay | Admitting: Hematology and Oncology

## 2022-08-07 DIAGNOSIS — C50212 Malignant neoplasm of upper-inner quadrant of left female breast: Secondary | ICD-10-CM

## 2022-08-07 DIAGNOSIS — Z170 Estrogen receptor positive status [ER+]: Secondary | ICD-10-CM

## 2022-08-07 DIAGNOSIS — Z95828 Presence of other vascular implants and grafts: Secondary | ICD-10-CM

## 2022-08-07 LAB — COMPREHENSIVE METABOLIC PANEL
ALT: 26 U/L (ref 0–44)
AST: 24 U/L (ref 15–41)
Albumin: 3.9 g/dL (ref 3.5–5.0)
Alkaline Phosphatase: 60 U/L (ref 38–126)
Anion gap: 4 — ABNORMAL LOW (ref 5–15)
BUN: 12 mg/dL (ref 8–23)
CO2: 27 mmol/L (ref 22–32)
Calcium: 9.2 mg/dL (ref 8.9–10.3)
Chloride: 109 mmol/L (ref 98–111)
Creatinine, Ser: 0.82 mg/dL (ref 0.44–1.00)
GFR, Estimated: >60 mL/min (ref >60)
Glucose, Bld: 99 mg/dL (ref 70–99)
Potassium: 4 mmol/L (ref 3.5–5.1)
Sodium: 140 mmol/L (ref 135–145)
Total Bilirubin: 0.5 mg/dL (ref 0.3–1.2)
Total Protein: 6.7 g/dL (ref 6.5–8.1)

## 2022-08-07 LAB — CBC WITH DIFFERENTIAL/PLATELET
Abs Immature Granulocytes: 0.01 10*3/uL (ref 0.00–0.07)
Basophils Absolute: 0 10*3/uL (ref 0.0–0.1)
Basophils Relative: 1 %
Eosinophils Absolute: 0.1 10*3/uL (ref 0.0–0.5)
Eosinophils Relative: 5 %
HCT: 29.2 % — ABNORMAL LOW (ref 36.0–46.0)
Hemoglobin: 9.6 g/dL — ABNORMAL LOW (ref 12.0–15.0)
Immature Granulocytes: 0 %
Lymphocytes Relative: 27 %
Lymphs Abs: 0.6 10*3/uL — ABNORMAL LOW (ref 0.7–4.0)
MCH: 30.4 pg (ref 26.0–34.0)
MCHC: 32.9 g/dL (ref 30.0–36.0)
MCV: 92.4 fL (ref 80.0–100.0)
Monocytes Absolute: 0.4 10*3/uL (ref 0.1–1.0)
Monocytes Relative: 18 %
Neutro Abs: 1.1 10*3/uL — ABNORMAL LOW (ref 1.7–7.7)
Neutrophils Relative %: 49 %
Platelets: 142 10*3/uL — ABNORMAL LOW (ref 150–400)
RBC: 3.16 MIL/uL — ABNORMAL LOW (ref 3.87–5.11)
RDW: 17 % — ABNORMAL HIGH (ref 11.5–15.5)
WBC: 2.3 10*3/uL — ABNORMAL LOW (ref 4.0–10.5)
nRBC: 0 % (ref 0.0–0.2)

## 2022-08-07 MED ORDER — SODIUM CHLORIDE 0.9% FLUSH
10.0000 mL | Freq: Once | INTRAVENOUS | Status: AC
  Administered 2022-08-07: 10 mL

## 2022-08-07 MED ORDER — SODIUM CHLORIDE 0.9 % IV SOLN
45.0000 mg/m2 | Freq: Once | INTRAVENOUS | Status: AC
  Administered 2022-08-07: 96 mg via INTRAVENOUS
  Filled 2022-08-07: qty 16

## 2022-08-07 MED ORDER — SODIUM CHLORIDE 0.9 % IV SOLN: Freq: Once | INTRAVENOUS | Status: AC

## 2022-08-07 MED ORDER — DIPHENHYDRAMINE HCL 50 MG/ML IJ SOLN
25.0000 mg | Freq: Once | INTRAMUSCULAR | Status: AC
  Administered 2022-08-07: 25 mg via INTRAVENOUS
  Filled 2022-08-07: qty 1

## 2022-08-07 MED ORDER — FAMOTIDINE IN NACL 20-0.9 MG/50ML-% IV SOLN
20.0000 mg | Freq: Once | INTRAVENOUS | Status: AC
  Administered 2022-08-07: 20 mg via INTRAVENOUS
  Filled 2022-08-07: qty 50

## 2022-08-07 MED ORDER — SODIUM CHLORIDE 0.9% FLUSH
10.0000 mL | INTRAVENOUS | Status: DC | PRN
  Administered 2022-08-07: 10 mL

## 2022-08-07 MED ORDER — SODIUM CHLORIDE 0.9 % IV SOLN
10.0000 mg | Freq: Once | INTRAVENOUS | Status: AC
  Administered 2022-08-07: 10 mg via INTRAVENOUS
  Filled 2022-08-07: qty 10

## 2022-08-07 NOTE — Patient Instructions (Signed)
[facility]   Discharge Instructions: Thank you for choosing [facility] to provide your oncology and hematology care.   If you have a lab appointment with the Cancer Center, please go directly to the Cancer Center and check in at the registration area.   Wear comfortable clothing and clothing appropriate for easy access to any Portacath or PICC line.   We strive to give you quality time with your provider. You may need to reschedule your appointment if you arrive late (15 or more minutes).  Arriving late affects you and other patients whose appointments are after yours.  Also, if you miss three or more appointments without notifying the office, you may be dismissed from the clinic at the provider's discretion.      For prescription refill requests, have your pharmacy contact our office and allow 72 hours for refills to be completed.    Today you received the following chemotherapy and/or immunotherapy agents: paclitaxel      To help prevent nausea and vomiting after your treatment, we encourage you to take your nausea medication as directed.  BELOW ARE SYMPTOMS THAT SHOULD BE REPORTED IMMEDIATELY: *FEVER GREATER THAN 100.4 F (38 C) OR HIGHER *CHILLS OR SWEATING *NAUSEA AND VOMITING THAT IS NOT CONTROLLED WITH YOUR NAUSEA MEDICATION *UNUSUAL SHORTNESS OF BREATH *UNUSUAL BRUISING OR BLEEDING *URINARY PROBLEMS (pain or burning when urinating, or frequent urination) *BOWEL PROBLEMS (unusual diarrhea, constipation, pain near the anus) TENDERNESS IN MOUTH AND THROAT WITH OR WITHOUT PRESENCE OF ULCERS (sore throat, sores in mouth, or a toothache) UNUSUAL RASH, SWELLING OR PAIN  UNUSUAL VAGINAL DISCHARGE OR ITCHING   Items with * indicate a potential emergency and should be followed up as soon as possible or go to the Emergency Department if any problems should occur.  Please show the CHEMOTHERAPY ALERT CARD or IMMUNOTHERAPY ALERT CARD at check-in  to the Emergency Department and triage nurse.  Should you have questions after your visit or need to cancel or reschedule your appointment, please contact [facility]  Dept: [phone]  and follow the prompts.  Office hours are 8:00 a.m. to 4:30 p.m. Monday - Friday. Please note that voicemails left after 4:00 p.m. may not be returned until the following business day.  We are closed weekends and major holidays. You have access to a nurse at all times for urgent questions. Please call the main number to the clinic Dept: [phone] and follow the prompts.   For any non-urgent questions, you may also contact your provider using MyChart. We now offer e-Visits for anyone 18 and older to request care online for non-urgent symptoms. For details visit [email or web address].   Also download the MyChart app! Go to the app store, search "MyChart", open the app, select [facility], and log in with your MyChart username and password.  Masks are optional in the cancer centers. If you would like for your care team to wear a mask while they are taking care of you, please let them know. You may have one support person who is at least 64 years old accompany you for your appointments. 

## 2022-08-07 NOTE — Assessment & Plan Note (Deleted)
03/05/2022:Left mastectomy: Grade 3 IDC 2.7 cm, focal high-grade DCIS, margins negative, 1/2 lymph nodes positive ER 99%, PR 99%, HER2 negative, Ki-67 50% 03/24/2022:Oncotype DX recurrence score: 30 (distant recurrence at 9 years with hormone therapy alone: 23%  Treatment plan: 1.Adjuvant chemotherapy with dose dense Adriamycin and Cytoxan x4 followed by Taxol x12 2.adjuvant antiestrogen therapy ----------------------------------------------------------------------------------------------------------------------------------------- Current treatment:Completed 4 cycles ofdose dense Adriamycin and Cytoxan, Today is cycle8Taxol  Chemotoxicities: 1. Hair loss. 2. Anemia: Patient started at a baseline hemoglobin of 10.7 and today is9 3. Thrombocytopenia: Monitoringtoday it is 138 4. Hospitalization 05/07/2022-05/09/2022 (fever, tachycardia, CT chest negative for PEbut showed bilateral upper lung infiltratestreated with Rocephin and azithromycin discharged home on Levaquin) 5. Skin discoloration of her hands and nailbed discoloration as well 6. Body aches: all over the body. 7. Neutropenia: Today's ANC is 1.2. I reduced the dosage of Taxol.  Okay to treat with ANC 1.2 today 8. Chemo-induced peripheral neuropathy: Grade 1: Monitoring   She is going to use ice for neuropathy prevention.  RTC weekly for Taxol and every other week for follow up with me

## 2022-08-07 NOTE — Progress Notes (Signed)
Ok to treat with ANC 1.1 per Dr Lindi Adie.

## 2022-08-09 ENCOUNTER — Other Ambulatory Visit: Payer: Self-pay

## 2022-08-10 ENCOUNTER — Other Ambulatory Visit: Payer: Self-pay

## 2022-08-11 ENCOUNTER — Telehealth: Payer: Self-pay | Admitting: Hematology and Oncology

## 2022-08-11 NOTE — Telephone Encounter (Signed)
Scheduled appointment per 8/17 los.Left voicemail.

## 2022-08-13 ENCOUNTER — Other Ambulatory Visit: Payer: Self-pay

## 2022-08-13 MED FILL — Dexamethasone Sodium Phosphate Inj 100 MG/10ML: INTRAMUSCULAR | Qty: 1 | Status: AC

## 2022-08-14 ENCOUNTER — Inpatient Hospital Stay: Payer: Self-pay

## 2022-08-14 ENCOUNTER — Encounter: Payer: Self-pay | Admitting: *Deleted

## 2022-08-14 ENCOUNTER — Other Ambulatory Visit: Payer: Self-pay

## 2022-08-14 VITALS — BP 137/76 | HR 90 | Temp 98.3°F | Resp 18 | Wt 206.0 lb

## 2022-08-14 DIAGNOSIS — Z170 Estrogen receptor positive status [ER+]: Secondary | ICD-10-CM

## 2022-08-14 DIAGNOSIS — Z95828 Presence of other vascular implants and grafts: Secondary | ICD-10-CM

## 2022-08-14 LAB — CBC WITH DIFFERENTIAL/PLATELET
Abs Immature Granulocytes: 0.01 10*3/uL (ref 0.00–0.07)
Basophils Absolute: 0 10*3/uL (ref 0.0–0.1)
Basophils Relative: 1 %
Eosinophils Absolute: 0.1 10*3/uL (ref 0.0–0.5)
Eosinophils Relative: 4 %
HCT: 27.9 % — ABNORMAL LOW (ref 36.0–46.0)
Hemoglobin: 9.4 g/dL — ABNORMAL LOW (ref 12.0–15.0)
Immature Granulocytes: 1 %
Lymphocytes Relative: 28 %
Lymphs Abs: 0.6 10*3/uL — ABNORMAL LOW (ref 0.7–4.0)
MCH: 30.9 pg (ref 26.0–34.0)
MCHC: 33.7 g/dL (ref 30.0–36.0)
MCV: 91.8 fL (ref 80.0–100.0)
Monocytes Absolute: 0.3 10*3/uL (ref 0.1–1.0)
Monocytes Relative: 17 %
Neutro Abs: 1 10*3/uL — ABNORMAL LOW (ref 1.7–7.7)
Neutrophils Relative %: 49 %
Platelets: 131 10*3/uL — ABNORMAL LOW (ref 150–400)
RBC: 3.04 MIL/uL — ABNORMAL LOW (ref 3.87–5.11)
RDW: 15.8 % — ABNORMAL HIGH (ref 11.5–15.5)
WBC: 2 10*3/uL — ABNORMAL LOW (ref 4.0–10.5)
nRBC: 0 % (ref 0.0–0.2)

## 2022-08-14 LAB — COMPREHENSIVE METABOLIC PANEL
ALT: 23 U/L (ref 0–44)
AST: 19 U/L (ref 15–41)
Albumin: 3.8 g/dL (ref 3.5–5.0)
Alkaline Phosphatase: 71 U/L (ref 38–126)
Anion gap: 4 — ABNORMAL LOW (ref 5–15)
BUN: 12 mg/dL (ref 8–23)
CO2: 27 mmol/L (ref 22–32)
Calcium: 9.1 mg/dL (ref 8.9–10.3)
Chloride: 109 mmol/L (ref 98–111)
Creatinine, Ser: 0.75 mg/dL (ref 0.44–1.00)
GFR, Estimated: >60 mL/min (ref >60)
Glucose, Bld: 123 mg/dL — ABNORMAL HIGH (ref 70–99)
Potassium: 3.6 mmol/L (ref 3.5–5.1)
Sodium: 140 mmol/L (ref 135–145)
Total Bilirubin: 0.5 mg/dL (ref 0.3–1.2)
Total Protein: 6.4 g/dL — ABNORMAL LOW (ref 6.5–8.1)

## 2022-08-14 MED ORDER — DIPHENHYDRAMINE HCL 50 MG/ML IJ SOLN
25.0000 mg | Freq: Once | INTRAMUSCULAR | Status: AC
  Administered 2022-08-14: 25 mg via INTRAVENOUS
  Filled 2022-08-14: qty 1

## 2022-08-14 MED ORDER — SODIUM CHLORIDE 0.9 % IV SOLN: Freq: Once | INTRAVENOUS | Status: AC

## 2022-08-14 MED ORDER — SODIUM CHLORIDE 0.9 % IV SOLN
45.0000 mg/m2 | Freq: Once | INTRAVENOUS | Status: AC
  Administered 2022-08-14: 96 mg via INTRAVENOUS
  Filled 2022-08-14: qty 16

## 2022-08-14 MED ORDER — SODIUM CHLORIDE 0.9 % IV SOLN
10.0000 mg | Freq: Once | INTRAVENOUS | Status: AC
  Administered 2022-08-14: 10 mg via INTRAVENOUS
  Filled 2022-08-14: qty 10

## 2022-08-14 MED ORDER — SODIUM CHLORIDE 0.9% FLUSH
10.0000 mL | INTRAVENOUS | Status: DC | PRN
  Administered 2022-08-14: 10 mL

## 2022-08-14 MED ORDER — SODIUM CHLORIDE 0.9% FLUSH
10.0000 mL | Freq: Once | INTRAVENOUS | Status: AC
  Administered 2022-08-14: 10 mL

## 2022-08-14 MED ORDER — FAMOTIDINE IN NACL 20-0.9 MG/50ML-% IV SOLN
20.0000 mg | Freq: Once | INTRAVENOUS | Status: AC
  Administered 2022-08-14: 20 mg via INTRAVENOUS
  Filled 2022-08-14: qty 50

## 2022-08-14 MED ORDER — HEPARIN SOD (PORK) LOCK FLUSH 100 UNIT/ML IV SOLN
500.0000 [IU] | Freq: Once | INTRAVENOUS | Status: AC | PRN
  Administered 2022-08-14: 500 [IU]

## 2022-08-14 NOTE — Patient Instructions (Signed)
[facility]COL[facility]   Discharge Instructions: Thank you for choosing [facility] to provide your oncology and hematology care.   If you have a lab appointment with the Cancer Center, please go directly to the Cancer Center and check in at the registration area.   Wear comfortable clothing and clothing appropriate for easy access to any Portacath or PICC line.   We strive to give you quality time with your provider. You may need to reschedule your appointment if you arrive late (15 or more minutes).  Arriving late affects you and other patients whose appointments are after yours.  Also, if you miss three or more appointments without notifying the office, you may be dismissed from the clinic at the provider's discretion.      For prescription refill requests, have your pharmacy contact our office and allow 72 hours for refills to be completed.    Today you received the following chemotherapy and/or immunotherapy agents: paclitaxel      To help prevent nausea and vomiting after your treatment, we encourage you to take your nausea medication as directed.  BELOW ARE SYMPTOMS THAT SHOULD BE REPORTED IMMEDIATELY: *FEVER GREATER THAN 100.4 F (38 C) OR HIGHER *CHILLS OR SWEATING *NAUSEA AND VOMITING THAT IS NOT CONTROLLED WITH YOUR NAUSEA MEDICATION *UNUSUAL SHORTNESS OF BREATH *UNUSUAL BRUISING OR BLEEDING *URINARY PROBLEMS (pain or burning when urinating, or frequent urination) *BOWEL PROBLEMS (unusual diarrhea, constipation, pain near the anus) TENDERNESS IN MOUTH AND THROAT WITH OR WITHOUT PRESENCE OF ULCERS (sore throat, sores in mouth, or a toothache) UNUSUAL RASH, SWELLING OR PAIN  UNUSUAL VAGINAL DISCHARGE OR ITCHING   Items with * indicate a potential emergency and should be followed up as soon as possible or go to the Emergency Department if any problems should occur.  Please show the CHEMOTHERAPY ALERT CARD or IMMUNOTHERAPY ALERT CARD at check-in  to the Emergency Department and triage nurse.  Should you have questions after your visit or need to cancel or reschedule your appointment, please contact [facility]  Dept: [phone]  and follow the prompts.  Office hours are 8:00 a.m. to 4:30 p.m. Monday - Friday. Please note that voicemails left after 4:00 p.m. may not be returned until the following business day.  We are closed weekends and major holidays. You have access to a nurse at all times for urgent questions. Please call the main number to the clinic Dept: [phone] and follow the prompts.   For any non-urgent questions, you may also contact your provider using MyChart. We now offer e-Visits for anyone 18 and older to request care online for non-urgent symptoms. For details visit [email or web address].   Also download the MyChart app! Go to the app store, search "MyChart", open the app, select [facility], and log in with your MyChart username and password.  Masks are optional in the cancer centers. If you would like for your care team to wear a mask while they are taking care of you, please let them know. You may have one support Devun Anna who is at least 64 years old accompany you for your appointments. 

## 2022-08-14 NOTE — Progress Notes (Signed)
Per Dr Lindi Adie, okay to treat with ANC 1.0

## 2022-08-20 MED FILL — Dexamethasone Sodium Phosphate Inj 100 MG/10ML: INTRAMUSCULAR | Qty: 1 | Status: AC

## 2022-08-21 ENCOUNTER — Other Ambulatory Visit: Payer: Self-pay

## 2022-08-21 ENCOUNTER — Inpatient Hospital Stay: Payer: Self-pay

## 2022-08-21 ENCOUNTER — Encounter: Payer: Self-pay | Admitting: Adult Health

## 2022-08-21 ENCOUNTER — Inpatient Hospital Stay (HOSPITAL_BASED_OUTPATIENT_CLINIC_OR_DEPARTMENT_OTHER): Payer: Self-pay | Admitting: Adult Health

## 2022-08-21 VITALS — BP 157/78 | HR 70 | Temp 97.7°F | Resp 18 | Ht 64.0 in | Wt 204.2 lb

## 2022-08-21 DIAGNOSIS — C50212 Malignant neoplasm of upper-inner quadrant of left female breast: Secondary | ICD-10-CM

## 2022-08-21 DIAGNOSIS — Z170 Estrogen receptor positive status [ER+]: Secondary | ICD-10-CM

## 2022-08-21 LAB — COMPREHENSIVE METABOLIC PANEL
ALT: 23 U/L (ref 0–44)
AST: 20 U/L (ref 15–41)
Albumin: 3.7 g/dL (ref 3.5–5.0)
Alkaline Phosphatase: 61 U/L (ref 38–126)
Anion gap: 5 (ref 5–15)
BUN: 9 mg/dL (ref 8–23)
CO2: 28 mmol/L (ref 22–32)
Calcium: 9.1 mg/dL (ref 8.9–10.3)
Chloride: 106 mmol/L (ref 98–111)
Creatinine, Ser: 0.78 mg/dL (ref 0.44–1.00)
GFR, Estimated: >60 mL/min (ref >60)
Glucose, Bld: 94 mg/dL (ref 70–99)
Potassium: 3.8 mmol/L (ref 3.5–5.1)
Sodium: 139 mmol/L (ref 135–145)
Total Bilirubin: 0.4 mg/dL (ref 0.3–1.2)
Total Protein: 6.5 g/dL (ref 6.5–8.1)

## 2022-08-21 LAB — CBC WITH DIFFERENTIAL/PLATELET
Abs Immature Granulocytes: 0.01 10*3/uL (ref 0.00–0.07)
Basophils Absolute: 0 10*3/uL (ref 0.0–0.1)
Basophils Relative: 1 %
Eosinophils Absolute: 0.1 10*3/uL (ref 0.0–0.5)
Eosinophils Relative: 4 %
HCT: 27.9 % — ABNORMAL LOW (ref 36.0–46.0)
Hemoglobin: 9.4 g/dL — ABNORMAL LOW (ref 12.0–15.0)
Immature Granulocytes: 1 %
Lymphocytes Relative: 29 %
Lymphs Abs: 0.5 10*3/uL — ABNORMAL LOW (ref 0.7–4.0)
MCH: 30.6 pg (ref 26.0–34.0)
MCHC: 33.7 g/dL (ref 30.0–36.0)
MCV: 90.9 fL (ref 80.0–100.0)
Monocytes Absolute: 0.3 10*3/uL (ref 0.1–1.0)
Monocytes Relative: 18 %
Neutro Abs: 0.9 10*3/uL — ABNORMAL LOW (ref 1.7–7.7)
Neutrophils Relative %: 47 %
Platelets: 133 10*3/uL — ABNORMAL LOW (ref 150–400)
RBC: 3.07 MIL/uL — ABNORMAL LOW (ref 3.87–5.11)
RDW: 15 % (ref 11.5–15.5)
WBC: 1.8 10*3/uL — ABNORMAL LOW (ref 4.0–10.5)
nRBC: 0 % (ref 0.0–0.2)

## 2022-08-21 MED ORDER — SODIUM CHLORIDE 0.9 % IV SOLN
45.0000 mg/m2 | Freq: Once | INTRAVENOUS | Status: AC
  Administered 2022-08-21: 96 mg via INTRAVENOUS
  Filled 2022-08-21: qty 16

## 2022-08-21 MED ORDER — DIPHENHYDRAMINE HCL 50 MG/ML IJ SOLN
25.0000 mg | Freq: Once | INTRAMUSCULAR | Status: AC
  Administered 2022-08-21: 25 mg via INTRAVENOUS
  Filled 2022-08-21: qty 1

## 2022-08-21 MED ORDER — SODIUM CHLORIDE 0.9 % IV SOLN: Freq: Once | INTRAVENOUS | Status: AC

## 2022-08-21 MED ORDER — FAMOTIDINE IN NACL 20-0.9 MG/50ML-% IV SOLN
20.0000 mg | Freq: Once | INTRAVENOUS | Status: AC
  Administered 2022-08-21: 20 mg via INTRAVENOUS
  Filled 2022-08-21: qty 50

## 2022-08-21 MED ORDER — SODIUM CHLORIDE 0.9 % IV SOLN
10.0000 mg | Freq: Once | INTRAVENOUS | Status: AC
  Administered 2022-08-21: 10 mg via INTRAVENOUS
  Filled 2022-08-21: qty 10

## 2022-08-21 NOTE — Assessment & Plan Note (Signed)
Madison Becker is a 66 year old woman with stage IIa estrogen progesterone positive breast cancer with high risk Oncotype here today to receive #10 of her weekly adjuvant Taxol chemotherapy.  She is already completed 4 cycles of Adriamycin and Cytoxan.  Madison Becker will proceed with her Taxol chemotherapy today at the current dose reduction.  She is tolerating this moderately well.  She will receive Granix 2 days prior to her next infusion because her ANC is 0.9.  I reviewed this with Dr. Lindi Adie and verified with Madison Cornea RN that she is okay to treat today with the current [place].  She does have some taste changes which have impacted her appetite and food intake.  She has lost approximately 10 pounds since starting chemotherapy.  We will continue to monitor this and I encouraged continued fluid intake and looking at substituting meals if needed with Ensure or similar.  Her neuropathy remains stable and grade 1.  She will continue her chemotherapy at a dose reduction.  We discussed that should it become constant or worsen that we may stop her treatment early and she understands to let us know if this is the case.  Madison Becker will return in 1 week for #11 of Taxol and will see [place] in 2 weeks for her final chemotherapy.  We are always happy to see her sooner if needed.

## 2022-08-21 NOTE — Progress Notes (Signed)
Ok to treat today Per provider with [place] of 0.9.

## 2022-08-21 NOTE — Progress Notes (Signed)
[facility] up:    Pcp, No No address on file   DIAGNOSIS:  Cancer Staging  Malignant neoplasm of upper-inner quadrant of left breast in female, estrogen receptor positive ([place]) Staging form: Breast, AJCC 8th Edition - Clinical stage from 10/22/2021: Stage IIA (cT2, cN0(f), cM0, G3, ER+, PR+, HER2-) - Signed by [name], MD on 01/02/2022 Stage prefix: Initial diagnosis Method of lymph node assessment: Core biopsy Histologic grading system: 3 grade system   SUMMARY OF ONCOLOGIC HISTORY: Oncology History  Malignant neoplasm of upper-inner quadrant of left breast in female, estrogen receptor positive ([place])  10/22/2021 Initial Diagnosis   Palpable left breast mass. Diagnostic mammogram and [place]: an irregular mass within the upper inner left breast, IDC 2.2 cm ER 99%, PR 99%, HER2 negative, Ki-67 50%.  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  Total area of involvement 6 cm   10/22/2021 Cancer Staging   Staging form: Breast, AJCC 8th Edition - Clinical stage from 10/22/2021: Stage IIA (cT2, cN0(f), cM0, G3, ER+, PR+, HER2-) - Signed by [name], MD on 01/02/2022 Stage prefix: Initial diagnosis Method of lymph node assessment: Core biopsy Histologic grading system: 3 grade system   11/04/2021 Breast MRI   2.5 cm irregular enhancing mass UIQ left breast IDC with DCIS.  Additional non-mass enhancement extends anterior and inferior to the mass 2.5 cm total span 7.1 cm.   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, focal high-grade DCIS, margins negative, 1/2 lymph nodes positive ER 99%, PR 99%, HER2 negative, Ki-67 50%   03/24/2022 Oncotype testing   Oncotype DX recurrence score: 30 (distant recurrence at 9 years with hormone therapy alone: 23%   04/10/2022 -  Chemotherapy   Patient is on Treatment Plan : BREAST ADJUVANT DOSE DENSE AC q14d / PACLitaxel q7d        CURRENT THERAPY: weekly taxol  INTERVAL HISTORY: Afomia Blackley 66 y.o. female returns for f/u while receiving her adjuvant chemotherapy with weekly Taxol.  Today she is due for cycle 10.  She is receiving dose reduced Taxol due to a grade 1 neuropathy in her toes.  This continues to be intermittent and can sometimes happen worse at night however she is not currently experiencing any neuropathy.  She does not have any neuropathy in her fingertips.  She continues to tolerate treatment moderately well.  She does have a slightly decreased appetite and this is secondary to taste changes.  She is struggling to find palatable foods.  She has lost approximately 10 pounds and is working on supplementing meals when needed.  Her white blood cells today are 1.8 and her neutrophils are 0.9.  She has had no fever chills or sign of infection.   Patient Active Problem List   Diagnosis Date Noted   Hilar adenopathy 05/08/2022   Multifocal pneumonia 05/07/2022   Port-A-Cath in place 04/09/2022   S/P left mastectomy 03/05/2022   Malignant neoplasm of upper-inner quadrant of left breast in female, estrogen receptor positive ([place]) 10/22/2021    is allergic to beef-derived products and pork-derived products.  MEDICAL HISTORY: Past Medical History:  Diagnosis Date   Breast cancer ([place]) 09/2021   GERD (gastroesophageal reflux disease)    History of cervical dysplasia     SURGICAL HISTORY: Past Surgical History:  Procedure Laterality Date   IR IMAGING GUIDED PORT INSERTION  04/09/2022   LEEP  2008   MASTECTOMY W/ SENTINEL NODE BIOPSY Left 03/05/2022   Procedure: LEFT MASTECTOMY WITH SENTINEL LYMPH NODE BIOPSY;  Surgeon: [name], MD;  Location: [facility];  Service: General;  Laterality: Left;    SOCIAL HISTORY: Social History   Socioeconomic History   Marital status: Single    Spouse name: Not on file   Number of children: Not on file   Years of education: Not on  file   Highest education level: Not on file  Occupational History   Not on file  Tobacco Use   Smoking status: Former    Types: Cigarettes   Smokeless tobacco: Never  Vaping Use   Vaping Use: Never used  Substance and Sexual Activity   Alcohol use: Yes    Comment: occ   Drug use: Never   Sexual activity: Not on file  Other Topics Concern   Not on file  Social History Narrative   Not on file   Social Determinants of Health   Financial Resource Strain: Not on file  Food Insecurity: Not on file  Transportation Needs: Not on file  Physical Activity: Not on file  Stress: Not on file  Social Connections: Not on file  Intimate Partner Violence: Not At Risk (10/22/2021)   Humiliation, Afraid, Rape, and Kick questionnaire    Fear of Current or Ex-Partner: No    Emotionally Abused: No    Physically Abused: No    Sexually Abused: No    FAMILY HISTORY: Family History  Problem Relation Age of Onset   Hypertension Mother    Deep vein thrombosis Mother    Coronary artery disease Father    Breast cancer Maternal Aunt     Review of Systems  Constitutional:  Positive for appetite change (Taste change), fatigue and unexpected weight change. Negative for chills and fever.  HENT:   Negative for hearing loss, lump/mass, mouth sores and trouble swallowing.   Eyes:  Negative for eye problems and icterus.  Respiratory:  Negative for chest tightness, cough and shortness of breath.   Cardiovascular:  Negative for chest pain, leg swelling and palpitations.  Gastrointestinal:  Negative for abdominal distention, abdominal pain, constipation, diarrhea, nausea and vomiting.  Endocrine: Negative for hot flashes.  Genitourinary:  Negative for difficulty urinating.   Musculoskeletal:  Negative for arthralgias.  Skin:  Negative for itching and rash.  Neurological:  Negative for dizziness, extremity weakness, headaches and numbness.  Hematological:  Negative for adenopathy. Does not bruise/bleed  easily.  Psychiatric/Behavioral:  Negative for depression. The patient is not nervous/anxious.       PHYSICAL EXAMINATION  ECOG PERFORMANCE STATUS: 1 - Symptomatic but completely ambulatory  Vitals:   08/21/22 0920  BP: (!) 157/78  Pulse: 70  Resp: 18  Temp: 97.7 F (36.5 C)  SpO2: 100%    Physical Exam Constitutional:      General: She is not in acute distress.    Appearance: Normal appearance. She is not toxic-appearing.  HENT:     Head: Normocephalic and atraumatic.  Eyes:     General: No scleral icterus. Cardiovascular:     Rate and Rhythm: Normal rate and regular rhythm.     Pulses: Normal pulses.     Heart sounds: Normal heart sounds.  Pulmonary:     Effort: Pulmonary effort is normal.     Breath sounds: Normal breath sounds.  Abdominal:     General: Abdomen is flat. Bowel sounds are normal. There is no distension.  Palpations: Abdomen is soft.     Tenderness: There is no abdominal tenderness.  Musculoskeletal:        General: No swelling.     Cervical back: Neck supple.  Lymphadenopathy:     Cervical: No cervical adenopathy.  Skin:    General: Skin is warm and dry.     Findings: No rash.  Neurological:     General: No focal deficit present.     Mental Status: She is alert.  Psychiatric:        Mood and Affect: Mood normal.        Behavior: Behavior normal.     LABORATORY DATA:  CBC    Component Value Date/Time   WBC 1.8 (L) 08/21/2022 0852   RBC 3.07 (L) 08/21/2022 0852   HGB 9.4 (L) 08/21/2022 0852   HGB 8.6 (L) 07/03/2022 1203   HCT 27.9 (L) 08/21/2022 0852   PLT 133 (L) 08/21/2022 0852   PLT 183 07/03/2022 1203   MCV 90.9 08/21/2022 0852   MCH 30.6 08/21/2022 0852   MCHC 33.7 08/21/2022 0852   RDW 15.0 08/21/2022 0852   LYMPHSABS 0.5 (L) 08/21/2022 0852   MONOABS 0.3 08/21/2022 0852   EOSABS 0.1 08/21/2022 0852   BASOSABS 0.0 08/21/2022 0852    CMP     Component Value Date/Time   NA 139 08/21/2022 0852   K 3.8 08/21/2022  0852   CL 106 08/21/2022 0852   CO2 28 08/21/2022 0852   GLUCOSE 94 08/21/2022 0852   BUN 9 08/21/2022 0852   CREATININE 0.78 08/21/2022 0852   CREATININE 0.82 07/03/2022 1203   CALCIUM 9.1 08/21/2022 0852   PROT 6.5 08/21/2022 0852   ALBUMIN 3.7 08/21/2022 0852   AST 20 08/21/2022 0852   AST 18 07/03/2022 1203   ALT 23 08/21/2022 0852   ALT 21 07/03/2022 1203   ALKPHOS 61 08/21/2022 0852   BILITOT 0.4 08/21/2022 0852   BILITOT 0.4 07/03/2022 1203   GFRNONAA >60 08/21/2022 0852   GFRNONAA >60 07/03/2022 1203        ASSESSMENT and THERAPY PLAN:   Malignant neoplasm of upper-inner quadrant of left breast in female, estrogen receptor positive ([place]) Cenia is a 66 year old woman with stage IIa estrogen progesterone positive breast cancer with high risk Oncotype here today to receive #10 of her weekly adjuvant Taxol chemotherapy.  She is already completed 4 cycles of Adriamycin and Cytoxan.  Elica will proceed with her Taxol chemotherapy today at the current dose reduction.  She is tolerating this moderately well.  She will receive Granix 2 days prior to her next infusion because her ANC is 0.9.  I reviewed this with Dr. Lindi Adie and verified with Corene Cornea RN that she is okay to treat today with the current [place].  She does have some taste changes which have impacted her appetite and food intake.  She has lost approximately 10 pounds since starting chemotherapy.  We will continue to monitor this and I encouraged continued fluid intake and looking at substituting meals if needed with Ensure or similar.  Her neuropathy remains stable and grade 1.  She will continue her chemotherapy at a dose reduction.  We discussed that should it become constant or worsen that we may stop her treatment early and she understands to let us know if this is the case.  Laiyla will return in 1 week for #11 of Taxol and will see [place] in 2 weeks for her final chemotherapy.  We are always happy to  see her sooner if  needed.     All questions were answered. The patient knows to call the clinic with any problems, questions or concerns. We can certainly see the patient much sooner if necessary.  Total encounter time:30 minutes*in face-to-face visit time, chart review, lab review, care coordination, order entry, and documentation of the encounter time.    Wilber Bihari, NP 08/21/22 10:32 AM Medical Oncology and Hematology [facility] [place],  [address] Tel. [phone]    Fax. [phone]  *Total Encounter Time as defined by the Centers for Medicare and Medicaid Services includes, in addition to the face-to-face time of a patient visit (documented in the note above) non-face-to-face time: obtaining and reviewing outside history, ordering and reviewing medications, tests or procedures, care coordination (communications with other health care professionals or caregivers) and documentation in the medical record.

## 2022-08-22 ENCOUNTER — Encounter: Payer: Self-pay | Admitting: General Practice

## 2022-08-22 ENCOUNTER — Other Ambulatory Visit: Payer: Self-pay

## 2022-08-22 NOTE — Progress Notes (Signed)
[facility] Spiritual Care Note  Received referral from [facility] regarding Madison Becker' interest in Advance Directives. Left voicemail encouraging return call, and will try again next week if needed.   [place], [place], [facility] Pager [phone] Voicemail [phone]

## 2022-08-23 ENCOUNTER — Other Ambulatory Visit: Payer: Self-pay | Admitting: Hematology and Oncology

## 2022-08-23 DIAGNOSIS — C50212 Malignant neoplasm of upper-inner quadrant of left female breast: Secondary | ICD-10-CM

## 2022-08-27 ENCOUNTER — Encounter: Payer: Self-pay | Admitting: General Practice

## 2022-08-27 MED FILL — Dexamethasone Sodium Phosphate Inj 100 MG/10ML: INTRAMUSCULAR | Qty: 1 | Status: AC

## 2022-08-27 NOTE — Progress Notes (Signed)
[facility] Spiritual Care Note  Left second voicemail regarding availability of upcoming [facility], encouraging return call.   [place], [place], [facility] Pager [phone] Voicemail [phone]

## 2022-08-28 ENCOUNTER — Inpatient Hospital Stay: Payer: Self-pay | Attending: Hematology and Oncology

## 2022-08-28 ENCOUNTER — Inpatient Hospital Stay: Payer: Self-pay

## 2022-08-28 ENCOUNTER — Other Ambulatory Visit: Payer: Self-pay

## 2022-08-28 VITALS — BP 138/82 | HR 59 | Temp 98.4°F | Resp 18 | Wt 204.2 lb

## 2022-08-28 DIAGNOSIS — Z79899 Other long term (current) drug therapy: Secondary | ICD-10-CM | POA: Insufficient documentation

## 2022-08-28 DIAGNOSIS — C50212 Malignant neoplasm of upper-inner quadrant of left female breast: Secondary | ICD-10-CM | POA: Insufficient documentation

## 2022-08-28 DIAGNOSIS — Z5111 Encounter for antineoplastic chemotherapy: Secondary | ICD-10-CM | POA: Insufficient documentation

## 2022-08-28 DIAGNOSIS — Z95828 Presence of other vascular implants and grafts: Secondary | ICD-10-CM

## 2022-08-28 LAB — CBC WITH DIFFERENTIAL (CANCER CENTER ONLY)
Abs Immature Granulocytes: 0.01 10*3/uL (ref 0.00–0.07)
Basophils Absolute: 0 10*3/uL (ref 0.0–0.1)
Basophils Relative: 1 %
Eosinophils Absolute: 0.1 10*3/uL (ref 0.0–0.5)
Eosinophils Relative: 3 %
HCT: 29 % — ABNORMAL LOW (ref 36.0–46.0)
Hemoglobin: 9.8 g/dL — ABNORMAL LOW (ref 12.0–15.0)
Immature Granulocytes: 1 %
Lymphocytes Relative: 34 %
Lymphs Abs: 0.7 10*3/uL (ref 0.7–4.0)
MCH: 30.7 pg (ref 26.0–34.0)
MCHC: 33.8 g/dL (ref 30.0–36.0)
MCV: 90.9 fL (ref 80.0–100.0)
Monocytes Absolute: 0.3 10*3/uL (ref 0.1–1.0)
Monocytes Relative: 12 %
Neutro Abs: 1.1 10*3/uL — ABNORMAL LOW (ref 1.7–7.7)
Neutrophils Relative %: 49 %
Platelet Count: 125 10*3/uL — ABNORMAL LOW (ref 150–400)
RBC: 3.19 MIL/uL — ABNORMAL LOW (ref 3.87–5.11)
RDW: 14.6 % (ref 11.5–15.5)
WBC Count: 2.1 10*3/uL — ABNORMAL LOW (ref 4.0–10.5)
nRBC: 0 % (ref 0.0–0.2)

## 2022-08-28 LAB — CMP (CANCER CENTER ONLY)
ALT: 19 U/L (ref 0–44)
AST: 17 U/L (ref 15–41)
Albumin: 3.8 g/dL (ref 3.5–5.0)
Alkaline Phosphatase: 60 U/L (ref 38–126)
Anion gap: 6 (ref 5–15)
BUN: 14 mg/dL (ref 8–23)
CO2: 25 mmol/L (ref 22–32)
Calcium: 9.2 mg/dL (ref 8.9–10.3)
Chloride: 110 mmol/L (ref 98–111)
Creatinine: 0.9 mg/dL (ref 0.44–1.00)
GFR, Estimated: >60 mL/min (ref >60)
Glucose, Bld: 118 mg/dL — ABNORMAL HIGH (ref 70–99)
Potassium: 3.8 mmol/L (ref 3.5–5.1)
Sodium: 141 mmol/L (ref 135–145)
Total Bilirubin: 0.4 mg/dL (ref 0.3–1.2)
Total Protein: 6.6 g/dL (ref 6.5–8.1)

## 2022-08-28 MED ORDER — DIPHENHYDRAMINE HCL 50 MG/ML IJ SOLN
50.0000 mg | Freq: Once | INTRAMUSCULAR | Status: AC
  Administered 2022-08-28: 50 mg via INTRAVENOUS
  Filled 2022-08-28: qty 1

## 2022-08-28 MED ORDER — SODIUM CHLORIDE 0.9 % IV SOLN: Freq: Once | INTRAVENOUS | Status: AC

## 2022-08-28 MED ORDER — SODIUM CHLORIDE 0.9% FLUSH
10.0000 mL | INTRAVENOUS | Status: DC | PRN
  Administered 2022-08-28: 10 mL

## 2022-08-28 MED ORDER — SODIUM CHLORIDE 0.9% FLUSH
10.0000 mL | Freq: Once | INTRAVENOUS | Status: AC
  Administered 2022-08-28: 10 mL

## 2022-08-28 MED ORDER — FAMOTIDINE IN NACL 20-0.9 MG/50ML-% IV SOLN
20.0000 mg | Freq: Once | INTRAVENOUS | Status: AC
  Administered 2022-08-28: 20 mg via INTRAVENOUS
  Filled 2022-08-28: qty 50

## 2022-08-28 MED ORDER — SODIUM CHLORIDE 0.9 % IV SOLN
45.0000 mg/m2 | Freq: Once | INTRAVENOUS | Status: AC
  Administered 2022-08-28: 90 mg via INTRAVENOUS
  Filled 2022-08-28: qty 15

## 2022-08-28 MED ORDER — SODIUM CHLORIDE 0.9 % IV SOLN
10.0000 mg | Freq: Once | INTRAVENOUS | Status: AC
  Administered 2022-08-28: 10 mg via INTRAVENOUS
  Filled 2022-08-28: qty 10

## 2022-08-28 NOTE — Progress Notes (Signed)
Per Dr Lindi Adie ok to proceed with ANC of 1.1 today

## 2022-09-01 NOTE — Progress Notes (Signed)
Patient Care Team: Pcp, No as PCP - General Pershing Proud, RN as Oncology Nurse Navigator Donnelly Angelica, RN as Oncology Nurse Navigator Serena Croissant, MD as Consulting Physician (Hematology and Oncology)  DIAGNOSIS:  Encounter Diagnoses  Name Primary?   Malignant neoplasm of upper-inner quadrant of left breast in female, estrogen receptor positive (H[facility])    Postmenopausal Yes    SUMMARY OF ONCOLOGIC HISTORY: Oncology History  Malignant neoplasm of upper-inner quadrant of left breast in female, estrogen receptor positive ([facility])  10/22/2021 Initial Diagnosis   Palpable left breast mass. Diagnostic mammogram and [place]: an irregular mass within the upper inner left breast, IDC 2.2 cm ER 99%, PR 99%, HER2 negative, Ki-67 50%.  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  Total area of involvement 6 cm   10/22/2021 Cancer Staging   Staging form: Breast, AJCC 8th Edition - Clinical stage from 10/22/2021: Stage IIA (cT2, cN0(f), cM0, G3, ER+, PR+, HER2-) - Signed by Serena Croissant, MD on 01/02/2022 Stage prefix: Initial diagnosis Method of lymph node assessment: Core biopsy Histologic grading system: 3 grade system   11/04/2021 Breast MRI   2.5 cm irregular enhancing mass UIQ left breast IDC with DCIS.  Additional non-mass enhancement extends anterior and inferior to the mass 2.5 cm total span 7.1 cm.   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, focal high-grade DCIS, margins negative, 1/2 lymph nodes positive ER 99%, PR 99%, HER2 negative, Ki-67 50%   03/24/2022 Oncotype testing   Oncotype DX recurrence score: 30 (distant recurrence at 9 years with hormone therapy alone: 23%   04/10/2022 - 08/21/2022 Chemotherapy   Patient is on Treatment Plan : BREAST ADJUVANT DOSE DENSE AC q14d / PACLitaxel q7d     04/10/2022 -  Chemotherapy   Patient is on Treatment Plan : BREAST ADJUVANT DOSE DENSE AC q14d /  PACLitaxel q7d       CHIEF COMPLIANT: follow up cycl 12 Taxol  INTERVAL HISTORY: Madison Becker is a  66  y.o with the above-mention. She presents to the clinic today for a follow-up and treatment. She states the past 3 weeks it was a challenge. She states that she doesn't feel anything in her hands most mostly in toes mainly underneath toes not consistent and it comes at night mainly. It does interrupt her sleep. She gets up and rub her toes for relief. Denies dropping objects and she have not stumbled and she haven't falling. She complains of sweating in certain environments. Feels like a hot flash. She experience a little joint stiffness.   ALLERGIES:  is allergic to beef-derived products and pork-derived products.  MEDICATIONS:  Current Outpatient Medications  Medication Sig Dispense Refill   acetaminophen (TYLENOL) 500 MG tablet Take 1,000 mg by mouth every 6 (six) hours as needed for mild pain.     [START ON 09/30/2022] anastrozole (ARIMIDEX) 1 MG tablet Take 1 tablet (1 mg total) by mouth daily. 90 tablet 3   No current facility-administered medications for this visit.    PHYSICAL EXAMINATION: ECOG PERFORMANCE STATUS: 1 - Symptomatic but completely ambulatory  Vitals:   09/04/22 1158  BP: (!) 144/78  Pulse: 86  Resp: 18  Temp: 97.8 F (36.6 C)  SpO2: 99%   Filed Weights   09/04/22 1158  Weight: 203 lb 11.2 oz (92.4 kg)     LABORATORY DATA:  I have reviewed the data  as listed    Latest Ref Rng & Units 08/28/2022    7:55 AM 08/21/2022    8:52 AM 08/14/2022    9:08 AM  CMP  Glucose 70 - 99 mg/dL 118  94  123   BUN 8 - 23 mg/dL $Remove'14  9  12   'PdQXWli$ Creatinine 0.44 - 1.00 mg/dL 0.90  0.78  0.75   Sodium 135 - 145 mmol/L 141  139  140   Potassium 3.5 - 5.1 mmol/L 3.8  3.8  3.6   Chloride 98 - 111 mmol/L 110  106  109   CO2 22 - 32 mmol/L $RemoveB'25  28  27   'zPJwRCgK$ Calcium 8.9 - 10.3 mg/dL 9.2  9.1  9.1   Total Protein 6.5 - 8.1 g/dL 6.6  6.5  6.4   Total Bilirubin 0.3 - 1.2 mg/dL 0.4   0.4  0.5   Alkaline Phos 38 - 126 U/L 60  61  71   AST 15 - 41 U/L $Remo'17  20  19   'svzWz$ ALT 0 - 44 U/L $Remo'19  23  23     'dwZhi$ Lab Results  Component Value Date   WBC 2.5 (L) 09/04/2022   HGB 9.6 (L) 09/04/2022   HCT 29.7 (L) 09/04/2022   MCV 93.7 09/04/2022   PLT 142 (L) 09/04/2022   NEUTROABS 1.4 (L) 09/04/2022    ASSESSMENT & PLAN:  Malignant neoplasm of upper-inner quadrant of left breast in female, estrogen receptor positive ([place]) 03/05/2022:Left mastectomy: 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  Adjuvant chemotherapy with dose dense Adriamycin and Cytoxan x4 followed by Taxol x12 2. adjuvant antiestrogen therapy with anastrozole 1 mg daily to start 10/04/2022 ----------------------------------------------------------------------------------------------------------------------------------------- Current treatment: Completed 4 cycles of dose dense Adriamycin and Cytoxan, Today is cycle 12 Taxol   Chemotoxicities: Hair loss. Anemia: Patient started at a baseline hemoglobin of 9.6 Thrombocytopenia: Monitoring today it is 142 Hospitalization 05/07/2022-05/09/2022 (fever, tachycardia, CT chest negative for PE but showed bilateral upper lung infiltrates treated with Rocephin and azithromycin discharged home on Levaquin) Skin discoloration of her hands and nailbed discoloration as well Body aches: all over the body. Neutropenia: On lower dosage of Taxol.  Okay to treat with ANC 1.4 today Chemo-induced peripheral neuropathy: Grade 1: Monitoring We will arrange for Signatera testing   She is going to use ice for neuropathy prevention.        Orders Placed This Encounter  Procedures   DG Bone Density    Standing Status:   Future    Standing Expiration Date:   09/04/2023    Order  Specific Question:   Reason for Exam (SYMPTOM  OR DIAGNOSIS REQUIRED)    Answer:   postmenopausel    Order Specific Question:   Preferred imaging location?    Answer:   [facility]   The patient has a good understanding of the overall plan. she agrees with it. she will call with any problems that may develop before the next visit here. Total time spent: 30 mins including face to face time and time spent for planning, charting and co-ordination of care   [name], MD 09/04/22    I Gardiner Coins am scribing for Dr. Lindi Adie  I have reviewed the above documentation for accuracy and completeness, and I agree with  the above.

## 2022-09-03 MED FILL — Dexamethasone Sodium Phosphate Inj 100 MG/10ML: INTRAMUSCULAR | Qty: 1 | Status: AC

## 2022-09-04 ENCOUNTER — Inpatient Hospital Stay (HOSPITAL_BASED_OUTPATIENT_CLINIC_OR_DEPARTMENT_OTHER): Payer: Self-pay | Admitting: Hematology and Oncology

## 2022-09-04 ENCOUNTER — Encounter: Payer: Self-pay | Admitting: *Deleted

## 2022-09-04 ENCOUNTER — Inpatient Hospital Stay: Payer: Self-pay

## 2022-09-04 ENCOUNTER — Other Ambulatory Visit: Payer: Self-pay

## 2022-09-04 VITALS — BP 144/78 | HR 86 | Temp 97.8°F | Resp 18 | Ht 64.0 in | Wt 203.7 lb

## 2022-09-04 DIAGNOSIS — C50212 Malignant neoplasm of upper-inner quadrant of left female breast: Secondary | ICD-10-CM

## 2022-09-04 DIAGNOSIS — Z95828 Presence of other vascular implants and grafts: Secondary | ICD-10-CM

## 2022-09-04 DIAGNOSIS — Z170 Estrogen receptor positive status [ER+]: Secondary | ICD-10-CM

## 2022-09-04 DIAGNOSIS — Z780 Asymptomatic menopausal state: Secondary | ICD-10-CM

## 2022-09-04 LAB — CBC WITH DIFFERENTIAL (CANCER CENTER ONLY)
Abs Immature Granulocytes: 0.01 10*3/uL (ref 0.00–0.07)
Basophils Absolute: 0 10*3/uL (ref 0.0–0.1)
Basophils Relative: 0 %
Eosinophils Absolute: 0.1 10*3/uL (ref 0.0–0.5)
Eosinophils Relative: 2 %
HCT: 29.7 % — ABNORMAL LOW (ref 36.0–46.0)
Hemoglobin: 9.6 g/dL — ABNORMAL LOW (ref 12.0–15.0)
Immature Granulocytes: 0 %
Lymphocytes Relative: 27 %
Lymphs Abs: 0.7 10*3/uL (ref 0.7–4.0)
MCH: 30.3 pg (ref 26.0–34.0)
MCHC: 32.3 g/dL (ref 30.0–36.0)
MCV: 93.7 fL (ref 80.0–100.0)
Monocytes Absolute: 0.4 10*3/uL (ref 0.1–1.0)
Monocytes Relative: 14 %
Neutro Abs: 1.4 10*3/uL — ABNORMAL LOW (ref 1.7–7.7)
Neutrophils Relative %: 57 %
Platelet Count: 142 10*3/uL — ABNORMAL LOW (ref 150–400)
RBC: 3.17 MIL/uL — ABNORMAL LOW (ref 3.87–5.11)
RDW: 15 % (ref 11.5–15.5)
WBC Count: 2.5 10*3/uL — ABNORMAL LOW (ref 4.0–10.5)
nRBC: 0 % (ref 0.0–0.2)

## 2022-09-04 LAB — CMP (CANCER CENTER ONLY)
ALT: 17 U/L (ref 0–44)
AST: 17 U/L (ref 15–41)
Albumin: 3.9 g/dL (ref 3.5–5.0)
Alkaline Phosphatase: 63 U/L (ref 38–126)
Anion gap: 5 (ref 5–15)
BUN: 17 mg/dL (ref 8–23)
CO2: 27 mmol/L (ref 22–32)
Calcium: 9.2 mg/dL (ref 8.9–10.3)
Chloride: 109 mmol/L (ref 98–111)
Creatinine: 0.86 mg/dL (ref 0.44–1.00)
GFR, Estimated: >60 mL/min (ref >60)
Glucose, Bld: 104 mg/dL — ABNORMAL HIGH (ref 70–99)
Potassium: 3.7 mmol/L (ref 3.5–5.1)
Sodium: 141 mmol/L (ref 135–145)
Total Bilirubin: 0.5 mg/dL (ref 0.3–1.2)
Total Protein: 6.4 g/dL — ABNORMAL LOW (ref 6.5–8.1)

## 2022-09-04 MED ORDER — DIPHENHYDRAMINE HCL 50 MG/ML IJ SOLN
50.0000 mg | Freq: Once | INTRAMUSCULAR | Status: AC
  Administered 2022-09-04: 50 mg via INTRAVENOUS
  Filled 2022-09-04: qty 1

## 2022-09-04 MED ORDER — SODIUM CHLORIDE 0.9 % IV SOLN
10.0000 mg | Freq: Once | INTRAVENOUS | Status: AC
  Administered 2022-09-04: 10 mg via INTRAVENOUS
  Filled 2022-09-04: qty 10

## 2022-09-04 MED ORDER — SODIUM CHLORIDE 0.9 % IV SOLN: Freq: Once | INTRAVENOUS | Status: AC

## 2022-09-04 MED ORDER — FAMOTIDINE IN NACL 20-0.9 MG/50ML-% IV SOLN
20.0000 mg | Freq: Once | INTRAVENOUS | Status: AC
  Administered 2022-09-04: 20 mg via INTRAVENOUS
  Filled 2022-09-04: qty 50

## 2022-09-04 MED ORDER — ANASTROZOLE 1 MG PO TABS: 1.0000 mg | ORAL_TABLET | Freq: Every day | ORAL | 3 refills | Status: DC

## 2022-09-04 MED ORDER — SODIUM CHLORIDE 0.9 % IV SOLN
45.0000 mg/m2 | Freq: Once | INTRAVENOUS | Status: AC
  Administered 2022-09-04: 90 mg via INTRAVENOUS
  Filled 2022-09-04: qty 15

## 2022-09-04 MED ORDER — SODIUM CHLORIDE 0.9% FLUSH
10.0000 mL | INTRAVENOUS | Status: DC | PRN
  Administered 2022-09-04: 10 mL

## 2022-09-04 MED ORDER — SODIUM CHLORIDE 0.9% FLUSH
10.0000 mL | Freq: Once | INTRAVENOUS | Status: AC
  Administered 2022-09-04: 10 mL

## 2022-09-04 NOTE — Patient Instructions (Signed)
[facility] MEDICAL ONCOLOGY   Discharge Instructions: Thank you for choosing [facility] to provide your oncology and hematology care.   If you have a lab appointment with the Cancer Center, please go directly to the Cancer Center and check in at the registration area.   Wear comfortable clothing and clothing appropriate for easy access to any Portacath or PICC line.   We strive to give you quality time with your provider. You may need to reschedule your appointment if you arrive late (15 or more minutes).  Arriving late affects you and other patients whose appointments are after yours.  Also, if you miss three or more appointments without notifying the office, you may be dismissed from the clinic at the provider's discretion.      For prescription refill requests, have your pharmacy contact our office and allow 72 hours for refills to be completed.    Today you received the following chemotherapy and/or immunotherapy agents: paclitaxel      To help prevent nausea and vomiting after your treatment, we encourage you to take your nausea medication as directed.  BELOW ARE SYMPTOMS THAT SHOULD BE REPORTED IMMEDIATELY: *FEVER GREATER THAN 100.4 F (38 C) OR HIGHER *CHILLS OR SWEATING *NAUSEA AND VOMITING THAT IS NOT CONTROLLED WITH YOUR NAUSEA MEDICATION *UNUSUAL SHORTNESS OF BREATH *UNUSUAL BRUISING OR BLEEDING *URINARY PROBLEMS (pain or burning when urinating, or frequent urination) *BOWEL PROBLEMS (unusual diarrhea, constipation, pain near the anus) TENDERNESS IN MOUTH AND THROAT WITH OR WITHOUT PRESENCE OF ULCERS (sore throat, sores in mouth, or a toothache) UNUSUAL RASH, SWELLING OR PAIN  UNUSUAL VAGINAL DISCHARGE OR ITCHING   Items with * indicate a potential emergency and should be followed up as soon as possible or go to the Emergency Department if any problems should occur.  Please show the CHEMOTHERAPY ALERT CARD or IMMUNOTHERAPY ALERT CARD at check-in  to the Emergency Department and triage nurse.  Should you have questions after your visit or need to cancel or reschedule your appointment, please contact [facility] MEDICAL ONCOLOGY  Dept: [phone]  and follow the prompts.  Office hours are 8:00 a.m. to 4:30 p.m. Monday - Friday. Please note that voicemails left after 4:00 p.m. may not be returned until the following business day.  We are closed weekends and major holidays. You have access to a nurse at all times for urgent questions. Please call the main number to the clinic Dept: [phone] and follow the prompts.   For any non-urgent questions, you may also contact your provider using MyChart. We now offer e-Visits for anyone 18 and older to request care online for non-urgent symptoms. For details visit [email or web address].   Also download the MyChart app! Go to the app store, search "MyChart", open the app, select [facility], and log in with your MyChart username and password.  Masks are optional in the cancer centers. If you would like for your care team to wear a mask while they are taking care of you, please let them know. You may have one support person who is at least 64 years old accompany you for your appointments. 

## 2022-09-04 NOTE — Assessment & Plan Note (Addendum)
03/05/2022:Left mastectomy: Grade 3 IDC 2.7 cm, focal high-grade DCIS, margins negative, 1/2 lymph nodes positive ER 99%, PR 99%, HER2 negative, Ki-67 50% 03/24/2022:Oncotype DX recurrence score: 30 (distant recurrence at 9 years with hormone therapy alone: 23%  Treatment plan: 1.Adjuvant chemotherapy with dose dense Adriamycin and Cytoxan x4 followed by Taxol x12 2.adjuvant antiestrogen therapy with anastrozole 1 mg daily to start 10/04/2022 ----------------------------------------------------------------------------------------------------------------------------------------- Current treatment:Completed 4 cycles ofdose dense Adriamycin and Cytoxan, Today is cycle12Taxol  Chemotoxicities: 1. Hair loss. 2. Anemia: Patient started at a baseline hemoglobin of 10.7 and today is9.6 3. Thrombocytopenia: Monitoringtoday it is142 4. Hospitalization 05/07/2022-05/09/2022 (fever, tachycardia, CT chest negative for PEbut showed bilateral upper lung infiltratestreated with Rocephin and azithromycin discharged home on Levaquin) 5. Skin discoloration of her hands and nailbed discoloration as well 6. Body aches: all over the body. 7. Neutropenia: On lower dosage of Taxol. Okay to treat with ANC 1.4 today 8. Chemo-induced peripheral neuropathy: Grade 1: Monitoring   She is going to use ice for neuropathy prevention.

## 2022-09-04 NOTE — Progress Notes (Signed)
Signatera requisition and all supporting documents faxed to [phone]. Fax confirmation received.

## 2022-09-06 ENCOUNTER — Other Ambulatory Visit: Payer: Self-pay

## 2022-09-16 ENCOUNTER — Other Ambulatory Visit: Payer: Self-pay | Admitting: Surgery

## 2022-09-28 LAB — SIGNATERA
SIGNATERA MTM READOUT: 0 MTM/ml
SIGNATERA TEST RESULT: NEGATIVE

## 2022-09-29 ENCOUNTER — Telehealth: Payer: Self-pay

## 2022-09-29 NOTE — Telephone Encounter (Signed)
Called and spoke with pt, informed her that signatera testing was negative.  Pt has additional questions about f/u and surveillance moving forward, I explained to her that we would continue with signatera testing and survivorship visit in December - she was educated over the phone regarding her anastrozole, pt verbalized understanding to the above.  Pt knows to call our office with any additional questions or concerns.

## 2022-10-01 ENCOUNTER — Ambulatory Visit (HOSPITAL_BASED_OUTPATIENT_CLINIC_OR_DEPARTMENT_OTHER)
Admission: RE | Admit: 2022-10-01 | Discharge: 2022-10-01 | Disposition: A | Discharge status: Home / Self Care | Payer: Medicaid Other | Source: Ambulatory Visit | Attending: Hematology and Oncology | Admitting: Hematology and Oncology

## 2022-10-01 DIAGNOSIS — Z170 Estrogen receptor positive status [ER+]: Secondary | ICD-10-CM | POA: Diagnosis present

## 2022-10-01 DIAGNOSIS — C50212 Malignant neoplasm of upper-inner quadrant of left female breast: Secondary | ICD-10-CM | POA: Insufficient documentation

## 2022-10-01 DIAGNOSIS — Z780 Asymptomatic menopausal state: Secondary | ICD-10-CM | POA: Diagnosis present

## 2022-10-16 ENCOUNTER — Encounter (HOSPITAL_BASED_OUTPATIENT_CLINIC_OR_DEPARTMENT_OTHER): Payer: Self-pay | Admitting: Surgery

## 2022-10-16 ENCOUNTER — Other Ambulatory Visit: Payer: Self-pay

## 2022-10-22 MED ORDER — CHLORHEXIDINE GLUCONATE CLOTH 2 % EX PADS: 6.0000 | MEDICATED_PAD | Freq: Once | CUTANEOUS | Status: DC

## 2022-10-22 NOTE — Anesthesia Preprocedure Evaluation (Signed)
Anesthesia Evaluation  Patient identified by MRN, date of birth, ID band Patient awake    Reviewed: Allergy & Precautions, NPO status , Patient's Chart, lab work & pertinent test results  History of Anesthesia Complications Negative for: history of anesthetic complications  Airway Mallampati: II  TM Distance: >3 FB Neck ROM: Full    Dental no notable dental hx. (+) Dental Advisory Given   Pulmonary former smoker   Pulmonary exam normal        Cardiovascular negative cardio ROS Normal cardiovascular exam     Neuro/Psych negative neurological ROS     GI/Hepatic Neg liver ROS,GERD  ,,  Endo/Other  negative endocrine ROS    Renal/GU negative Renal ROS     Musculoskeletal negative musculoskeletal ROS (+)    Abdominal   Peds  Hematology negative hematology ROS (+)   Anesthesia Other Findings   Reproductive/Obstetrics                             Anesthesia Physical Anesthesia Plan  ASA: 2  Anesthesia Plan: MAC   Post-op Pain Management: Celebrex PO (pre-op)* and Tylenol PO (pre-op)*   Induction:   PONV Risk Score and Plan: 2 and Ondansetron and Midazolam  Airway Management Planned: Natural Airway and Simple Face Mask  Additional Equipment:   Intra-op Plan:   Post-operative Plan:   Informed Consent: I have reviewed the patients History and Physical, chart, labs and discussed the procedure including the risks, benefits and alternatives for the proposed anesthesia with the patient or authorized representative who has indicated his/her understanding and acceptance.     Dental advisory given  Plan Discussed with: Anesthesiologist, CRNA and Surgeon  Anesthesia Plan Comments:         Anesthesia Quick Evaluation

## 2022-10-22 NOTE — H&P (Signed)
Madison Becker is an 66 y.o. female.   Chief Complaint: port no longer needed HPI: she has completed treatment for breast cancer and no longer needs her port a cath.  This one was placed by IR in the right IJ vein.  She is doing well and has no complaints  Past Medical History:  Diagnosis Date   Breast cancer ([facility]) 09/2021   GERD (gastroesophageal reflux disease)    History of cervical dysplasia     Past Surgical History:  Procedure Laterality Date   IR IMAGING GUIDED PORT INSERTION  04/09/2022   LEEP  2008   MASTECTOMY W/ SENTINEL NODE BIOPSY Left 03/05/2022   Procedure: LEFT MASTECTOMY WITH SENTINEL LYMPH NODE BIOPSY;  Surgeon: Madison Keens, MD;  Location: [facility];  Service: General;  Laterality: Left;    Family History  Problem Relation Age of Onset   Hypertension Mother    Deep vein thrombosis Mother    Coronary artery disease Father    Breast cancer Maternal Aunt    Social History:  reports that she has quit smoking. Her smoking use included cigarettes. She has never used smokeless tobacco. She reports current alcohol use. She reports that she does not use drugs.  Allergies:  Allergies  Allergen Reactions   Beef-Derived Products     Does not eat beef   Pork-Derived Products     Does not eat pork    No medications prior to admission.    No results found for this or any previous visit (from the past 48 hour(s)). No results found.  Review of Systems  All other systems reviewed and are negative.   Height '5\' 4"'$  (1.626 m), weight 93.9 kg. Physical Exam Constitutional:      Appearance: Normal appearance.  HENT:     Head: Normocephalic and atraumatic.  Cardiovascular:     Rate and Rhythm: Normal rate and regular rhythm.  Pulmonary:     Effort: Pulmonary effort is normal.     Breath sounds: Normal breath sounds.  Abdominal:     General: Abdomen is flat.     Palpations: Abdomen is soft.  Skin:    General: Skin is warm and dry.      Findings: No erythema.  Neurological:     General: No focal deficit present.     Mental Status: She is alert.  Psychiatric:        Behavior: Behavior normal.      Assessment/Plan History of breast cancer with treatment completed and port-a-cath no longer needed.  Will proceed to the OR for port removal.  I explained the procedure to the patient as well as the risks.  These include but are  not limited to bleeding, infection, injury to surrounding structures, etc.  Madison Keens, MD 10/22/2022, 4:25 PM

## 2022-10-22 NOTE — Progress Notes (Signed)
Surgical soap given to patient with instructions and patient verbalized understanding.       Patient Instructions  The night before surgery:  No food after midnight. ONLY clear liquids after midnight  The day of surgery (if you do NOT have diabetes):  Drink ONE (1) Pre-Surgery Clear Ensure as directed.   This drink was given to you during your hospital  pre-op appointment visit. The pre-op nurse will instruct you on the time to drink the  Pre-Surgery Ensure depending on your surgery time. Finish the drink at the designated time by the pre-op nurse.  Nothing else to drink after completing the  Pre-Surgery Clear Ensure.  The day of surgery (if you have diabetes): Drink ONE (1) Gatorade 2 (G2) as directed. This drink was given to you during your hospital  pre-op appointment visit.  The pre-op nurse will instruct you on the time to drink the   Gatorade 2 (G2) depending on your surgery time. Color of the Gatorade may vary. Red is not allowed. Nothing else to drink after completing the  Gatorade 2 (G2).         If you have questions, please contact your surgeon's office.

## 2022-10-23 ENCOUNTER — Ambulatory Visit (HOSPITAL_BASED_OUTPATIENT_CLINIC_OR_DEPARTMENT_OTHER): Payer: Medicaid Other | Admitting: Anesthesiology

## 2022-10-23 ENCOUNTER — Ambulatory Visit (HOSPITAL_BASED_OUTPATIENT_CLINIC_OR_DEPARTMENT_OTHER)
Admission: RE | Admit: 2022-10-23 | Discharge: 2022-10-23 | Disposition: A | Discharge status: Home / Self Care | Payer: Medicaid Other | Attending: Surgery | Admitting: Surgery

## 2022-10-23 ENCOUNTER — Encounter (HOSPITAL_BASED_OUTPATIENT_CLINIC_OR_DEPARTMENT_OTHER)
Admission: RE | Disposition: A | Discharge status: Home / Self Care | Payer: Self-pay | Source: Home / Self Care | Attending: Surgery

## 2022-10-23 ENCOUNTER — Encounter (HOSPITAL_BASED_OUTPATIENT_CLINIC_OR_DEPARTMENT_OTHER): Payer: Self-pay | Admitting: Surgery

## 2022-10-23 ENCOUNTER — Other Ambulatory Visit: Payer: Self-pay

## 2022-10-23 DIAGNOSIS — Z01818 Encounter for other preprocedural examination: Secondary | ICD-10-CM

## 2022-10-23 DIAGNOSIS — Z87891 Personal history of nicotine dependence: Secondary | ICD-10-CM | POA: Diagnosis not present

## 2022-10-23 DIAGNOSIS — Z853 Personal history of malignant neoplasm of breast: Secondary | ICD-10-CM | POA: Insufficient documentation

## 2022-10-23 DIAGNOSIS — Z9221 Personal history of antineoplastic chemotherapy: Secondary | ICD-10-CM | POA: Insufficient documentation

## 2022-10-23 DIAGNOSIS — Z452 Encounter for adjustment and management of vascular access device: Secondary | ICD-10-CM | POA: Insufficient documentation

## 2022-10-23 HISTORY — PX: PORT-A-CATH REMOVAL: SHX5289

## 2022-10-23 SURGERY — REMOVAL PORT-A-CATH
Anesthesia: Monitor Anesthesia Care | Site: Chest | Laterality: Right

## 2022-10-23 MED ORDER — FENTANYL CITRATE (PF) 100 MCG/2ML IJ SOLN
INTRAMUSCULAR | Status: AC
  Filled 2022-10-23: qty 2

## 2022-10-23 MED ORDER — AMISULPRIDE (ANTIEMETIC) 5 MG/2ML IV SOLN: 10.0000 mg | Freq: Once | INTRAVENOUS | Status: DC | PRN

## 2022-10-23 MED ORDER — LIDOCAINE HCL (PF) 1 % IJ SOLN
INTRAMUSCULAR | Status: AC
  Filled 2022-10-23: qty 30

## 2022-10-23 MED ORDER — ACETAMINOPHEN 500 MG PO TABS
ORAL_TABLET | ORAL | Status: AC
  Filled 2022-10-23: qty 2

## 2022-10-23 MED ORDER — CEFAZOLIN SODIUM-DEXTROSE 2-4 GM/100ML-% IV SOLN
INTRAVENOUS | Status: AC
  Filled 2022-10-23: qty 100

## 2022-10-23 MED ORDER — SUCCINYLCHOLINE CHLORIDE 200 MG/10ML IV SOSY
PREFILLED_SYRINGE | INTRAVENOUS | Status: AC
  Filled 2022-10-23: qty 10

## 2022-10-23 MED ORDER — MIDAZOLAM HCL 5 MG/5ML IJ SOLN
INTRAMUSCULAR | Status: DC | PRN
  Administered 2022-10-23: .5 mg via INTRAVENOUS

## 2022-10-23 MED ORDER — ONDANSETRON HCL 4 MG/2ML IJ SOLN
INTRAMUSCULAR | Status: AC
  Filled 2022-10-23: qty 2

## 2022-10-23 MED ORDER — TRAMADOL HCL 50 MG PO TABS: 50.0000 mg | ORAL_TABLET | Freq: Four times a day (QID) | ORAL | 0 refills | Status: DC | PRN

## 2022-10-23 MED ORDER — PROMETHAZINE HCL 25 MG/ML IJ SOLN: 6.2500 mg | INTRAMUSCULAR | Status: DC | PRN

## 2022-10-23 MED ORDER — PHENYLEPHRINE 80 MCG/ML (10ML) SYRINGE FOR IV PUSH (FOR BLOOD PRESSURE SUPPORT)
PREFILLED_SYRINGE | INTRAVENOUS | Status: AC
  Filled 2022-10-23: qty 10

## 2022-10-23 MED ORDER — CELECOXIB 200 MG PO CAPS
200.0000 mg | ORAL_CAPSULE | Freq: Once | ORAL | Status: AC
  Administered 2022-10-23: 200 mg via ORAL

## 2022-10-23 MED ORDER — CELECOXIB 200 MG PO CAPS
ORAL_CAPSULE | ORAL | Status: AC
  Filled 2022-10-23: qty 1

## 2022-10-23 MED ORDER — LIDOCAINE 2% (20 MG/ML) 5 ML SYRINGE
INTRAMUSCULAR | Status: AC
  Filled 2022-10-23: qty 5

## 2022-10-23 MED ORDER — EPHEDRINE 5 MG/ML INJ
INTRAVENOUS | Status: AC
  Filled 2022-10-23: qty 5

## 2022-10-23 MED ORDER — LIDOCAINE-EPINEPHRINE (PF) 1 %-1:200000 IJ SOLN
INTRAMUSCULAR | Status: DC | PRN
  Administered 2022-10-23: 12 mL

## 2022-10-23 MED ORDER — MIDAZOLAM HCL 2 MG/2ML IJ SOLN
INTRAMUSCULAR | Status: AC
  Filled 2022-10-23: qty 2

## 2022-10-23 MED ORDER — LACTATED RINGERS IV SOLN: INTRAVENOUS | Status: DC

## 2022-10-23 MED ORDER — ONDANSETRON HCL 4 MG/2ML IJ SOLN
INTRAMUSCULAR | Status: DC | PRN
  Administered 2022-10-23: 4 mg via INTRAVENOUS

## 2022-10-23 MED ORDER — LIDOCAINE 2% (20 MG/ML) 5 ML SYRINGE
INTRAMUSCULAR | Status: DC | PRN
  Administered 2022-10-23: 40 mg via INTRAVENOUS

## 2022-10-23 MED ORDER — LIDOCAINE-EPINEPHRINE (PF) 1 %-1:200000 IJ SOLN
INTRAMUSCULAR | Status: AC
  Filled 2022-10-23: qty 30

## 2022-10-23 MED ORDER — ACETAMINOPHEN 500 MG PO TABS
1000.0000 mg | ORAL_TABLET | Freq: Once | ORAL | Status: AC
  Administered 2022-10-23: 1000 mg via ORAL

## 2022-10-23 MED ORDER — ATROPINE SULFATE 0.4 MG/ML IV SOLN
INTRAVENOUS | Status: AC
  Filled 2022-10-23: qty 1

## 2022-10-23 MED ORDER — PROPOFOL 500 MG/50ML IV EMUL
INTRAVENOUS | Status: DC | PRN
  Administered 2022-10-23: 150 ug/kg/min via INTRAVENOUS

## 2022-10-23 MED ORDER — CEFAZOLIN SODIUM-DEXTROSE 2-4 GM/100ML-% IV SOLN: 2.0000 g | INTRAVENOUS | Status: DC

## 2022-10-23 MED ORDER — FENTANYL CITRATE (PF) 100 MCG/2ML IJ SOLN: 25.0000 ug | INTRAMUSCULAR | Status: DC | PRN

## 2022-10-23 MED ORDER — BUPIVACAINE-EPINEPHRINE (PF) 0.5% -1:200000 IJ SOLN
INTRAMUSCULAR | Status: AC
  Filled 2022-10-23: qty 180

## 2022-10-23 SURGICAL SUPPLY — 32 items
ADH SKN CLS APL DERMABOND .7 (GAUZE/BANDAGES/DRESSINGS) ×1
APL PRP STRL LF DISP 70% ISPRP (MISCELLANEOUS) ×1
BLADE SURG 15 STRL LF DISP TIS (BLADE) ×1 IMPLANT
BLADE SURG 15 STRL SS (BLADE) ×1
CHLORAPREP W/TINT 26 (MISCELLANEOUS) ×1 IMPLANT
COVER BACK TABLE 60X90IN (DRAPES) ×1 IMPLANT
COVER MAYO STAND STRL (DRAPES) ×1 IMPLANT
DERMABOND ADVANCED .7 DNX12 (GAUZE/BANDAGES/DRESSINGS) ×1 IMPLANT
DRAPE LAPAROTOMY 100X72 PEDS (DRAPES) ×1 IMPLANT
DRAPE UTILITY XL STRL (DRAPES) ×1 IMPLANT
ELECT REM PT RETURN 9FT ADLT (ELECTROSURGICAL) ×1
ELECTRODE REM PT RTRN 9FT ADLT (ELECTROSURGICAL) ×1 IMPLANT
GLOVE BIOGEL PI IND STRL 7.0 (GLOVE) IMPLANT
GLOVE SURG SIGNA 7.5 PF LTX (GLOVE) ×1 IMPLANT
GLOVE SURG SS PI 6.5 STRL IVOR (GLOVE) IMPLANT
GOWN STRL REUS W/ TWL LRG LVL3 (GOWN DISPOSABLE) ×1 IMPLANT
GOWN STRL REUS W/ TWL XL LVL3 (GOWN DISPOSABLE) ×1 IMPLANT
GOWN STRL REUS W/TWL LRG LVL3 (GOWN DISPOSABLE) ×1
GOWN STRL REUS W/TWL XL LVL3 (GOWN DISPOSABLE) ×1
NDL HYPO 25X1 1.5 SAFETY (NEEDLE) ×1 IMPLANT
NEEDLE HYPO 25X1 1.5 SAFETY (NEEDLE) ×1 IMPLANT
NS IRRIG 1000ML POUR BTL (IV SOLUTION) IMPLANT
PACK BASIN DAY SURGERY FS (CUSTOM PROCEDURE TRAY) ×1 IMPLANT
PENCIL SMOKE EVACUATOR (MISCELLANEOUS) ×1 IMPLANT
SLEEVE SCD COMPRESS KNEE MED (STOCKING) IMPLANT
SPIKE FLUID TRANSFER (MISCELLANEOUS) IMPLANT
SUT MNCRL AB 4-0 PS2 18 (SUTURE) ×1 IMPLANT
SUT VIC AB 3-0 SH 27 (SUTURE) ×1
SUT VIC AB 3-0 SH 27X BRD (SUTURE) ×1 IMPLANT
SYR BULB EAR ULCER 3OZ GRN STR (SYRINGE) IMPLANT
SYR CONTROL 10ML LL (SYRINGE) ×1 IMPLANT
TOWEL GREEN STERILE FF (TOWEL DISPOSABLE) ×1 IMPLANT

## 2022-10-23 NOTE — Transfer of Care (Signed)
Immediate Anesthesia Transfer of Care Note  Patient: Minyon Billiter  Procedure(s) Performed: REMOVAL PORT-A-CATH (Right: Chest)  Patient Location: PACU  Anesthesia Type:MAC  Level of Consciousness: sedated  Airway & Oxygen Therapy: Patient connected to face mask oxygen  Post-op Assessment: Report given to RN  Post vital signs: stable  Last Vitals:  Vitals Value Taken Time  BP    Temp    Pulse    Resp    SpO2      Last Pain:  Vitals:   10/23/22 0635  TempSrc: Oral  PainSc: 0-No pain      Patients Stated Pain Goal: 0 (76/14/70 9295)  Complications: No notable events documented.

## 2022-10-23 NOTE — Anesthesia Postprocedure Evaluation (Signed)
Anesthesia Post Note  Patient: Madison Becker  Procedure(s) Performed: REMOVAL PORT-A-CATH (Right: Chest)     Patient location during evaluation: PACU Anesthesia Type: MAC Level of consciousness: awake and alert Pain management: pain level controlled Vital Signs Assessment: post-procedure vital signs reviewed and stable Respiratory status: spontaneous breathing and respiratory function stable Cardiovascular status: stable Postop Assessment: no apparent nausea or vomiting Anesthetic complications: no   No notable events documented.  Last Vitals:  Vitals:   10/23/22 0800 10/23/22 0810  BP: 125/69 (!) 146/86  Pulse: 70 77  Resp: 15 18  Temp:  (!) 36.3 C  SpO2: 97% 95%    Last Pain:  Vitals:   10/23/22 0810  TempSrc: Oral  PainSc: 0-No pain                 Tamani Durney DANIEL

## 2022-10-23 NOTE — Interval H&P Note (Signed)
History and Physical Interval Note:no change in H and P  10/23/2022 7:00 AM  Madison Becker  has presented today for surgery, with the diagnosis of HX OF BREAST CANCER, PORT NO LONGER NEEDED.  The various methods of treatment have been discussed with the patient and family. After consideration of risks, benefits and other options for treatment, the patient has consented to  Procedure(s): REMOVAL PORT-A-CATH (N/A) as a surgical intervention.  The patient's history has been reviewed, patient examined, no change in status, stable for surgery.  I have reviewed the patient's chart and labs.  Questions were answered to the patient's satisfaction.     [name]

## 2022-10-23 NOTE — Op Note (Signed)
REMOVAL PORT-A-CATH  Procedure Note  Madison Becker 10/23/2022   Pre-op Diagnosis: HISTORY OF BREAST CANCER, PORT NO LONGER NEEDED     Post-op Diagnosis: same  Procedure(s): REMOVAL PORT-A-CATH  Surgeon(s): [name], MD  Anesthesia: Monitor Anesthesia Care  Staff:  Circulator: Izora Ribas, RN Scrub Person: Malachy Mood E, NT  Estimated Blood Loss: Minimal               Indications: This is a 66 year old female who has completed her chemotherapy treatment for breast cancer.  Port-A-Cath removal has now been recommended.  Procedure: The patient was brought to the operating room and identifies the correct patient.  She was placed supine on the operating room table and anesthesia was induced.  Her right chest over the port site was then prepped and draped in the usual sterile fashion.  I anesthetized the skin of the previous scar with lidocaine.  I made an incision through the scar with a scalpel.  I then dissected down to the port which was easily identified.  I removed the catheter completely intact and then remove the rest of the port altogether.  I then placed a figure-of-eight 3-0 Vicryl suture at the catheter tract site.  I then closed the subcutaneous tissue with interrupted 3-0 Vicryl sutures and closed the skin with a running 4-0 Monocryl.  Hemostasis appeared to be achieved.  Dermabond was then applied.  The patient tolerated the procedure well.  All the counts were correct at the end of the procedure.  She was then taken in a stable condition from the operating room to the recovery room.          [name]   Date: 10/23/2022  Time: 7:44 AM

## 2022-10-23 NOTE — Discharge Instructions (Addendum)
  Post Anesthesia Home Care Instructions  Activity: Get plenty of rest for the remainder of the day. A responsible individual must stay with you for 24 hours following the procedure.  For the next 24 hours, DO NOT: -Drive a car -Paediatric nurse -Drink alcoholic beverages -Take any medication unless instructed by your physician -Make any legal decisions or sign important papers.  Meals: Start with liquid foods such as gelatin or soup. Progress to regular foods as tolerated. Avoid greasy, spicy, heavy foods. If nausea and/or vomiting occur, drink only clear liquids until the nausea and/or vomiting subsides. Call your physician if vomiting continues.  Special Instructions/Symptoms: Your throat may feel dry or sore from the anesthesia or the breathing tube placed in your throat during surgery. If this causes discomfort, gargle with warm salt water. The discomfort should disappear within 24 hours.  If you had a scopolamine patch placed behind your ear for the management of post- operative nausea and/or vomiting:  1. The medication in the patch is effective for 72 hours, after which it should be removed.  Wrap patch in a tissue and discard in the trash. Wash hands thoroughly with soap and water. 2. You may remove the patch earlier than 72 hours if you experience unpleasant side effects which may include dry mouth, dizziness or visual disturbances. 3. Avoid touching the patch. Wash your hands with soap and water after contact with the patch.   You may shower starting tomorrow  Ice pack, Tylenol, and ibuprofen also for pain  No vigorous activity for approximately 5 days  For any questions or concerns, call our office at [phone]   Next dose of tylenool may be given at 1p

## 2022-10-24 ENCOUNTER — Encounter (HOSPITAL_BASED_OUTPATIENT_CLINIC_OR_DEPARTMENT_OTHER): Payer: Self-pay | Admitting: Surgery

## 2022-12-05 ENCOUNTER — Encounter: Payer: Self-pay | Admitting: Adult Health

## 2022-12-05 ENCOUNTER — Inpatient Hospital Stay: Payer: Medicaid Other | Attending: Adult Health | Admitting: Adult Health

## 2022-12-05 VITALS — BP 140/84 | HR 76 | Temp 97.3°F | Resp 18 | Ht 64.0 in | Wt 203.4 lb

## 2022-12-05 DIAGNOSIS — Z79811 Long term (current) use of aromatase inhibitors: Secondary | ICD-10-CM | POA: Insufficient documentation

## 2022-12-05 DIAGNOSIS — Z170 Estrogen receptor positive status [ER+]: Secondary | ICD-10-CM | POA: Diagnosis not present

## 2022-12-05 DIAGNOSIS — Z1231 Encounter for screening mammogram for malignant neoplasm of breast: Secondary | ICD-10-CM

## 2022-12-05 DIAGNOSIS — C50212 Malignant neoplasm of upper-inner quadrant of left female breast: Secondary | ICD-10-CM | POA: Diagnosis not present

## 2022-12-05 DIAGNOSIS — Z87891 Personal history of nicotine dependence: Secondary | ICD-10-CM | POA: Insufficient documentation

## 2022-12-05 DIAGNOSIS — Z9221 Personal history of antineoplastic chemotherapy: Secondary | ICD-10-CM | POA: Diagnosis not present

## 2022-12-05 DIAGNOSIS — Z9012 Acquired absence of left breast and nipple: Secondary | ICD-10-CM | POA: Diagnosis not present

## 2022-12-05 NOTE — Progress Notes (Signed)
SURVIVORSHIP VISIT:   BRIEF ONCOLOGIC HISTORY:  Oncology History  Malignant neoplasm of upper-inner quadrant of left breast in female, estrogen receptor positive (HCC)  10/22/2021 Initial Diagnosis   Palpable left breast mass. Diagnostic mammogram and [place]: an irregular mass within the upper inner left breast, IDC 2.2 cm ER 99%, PR 99%, HER2 negative, Ki-67 50%.  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  Total area of involvement 6 cm   10/22/2021 Cancer Staging   Staging form: Breast, AJCC 8th Edition - Clinical stage from 10/22/2021: Stage IIA (cT2, cN0(f), cM0, G3, ER+, PR+, HER2-) - Signed by Serena Croissant, MD on 01/02/2022 Stage prefix: Initial diagnosis Method of lymph node assessment: Core biopsy Histologic grading system: 3 grade system   11/04/2021 Breast MRI   2.5 cm irregular enhancing mass UIQ left breast IDC with DCIS.  Additional non-mass enhancement extends anterior and inferior to the mass 2.5 cm total span 7.1 cm.   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, focal high-grade DCIS, margins negative, 1/2 lymph nodes positive ER 99%, PR 99%, HER2 negative, Ki-67 50%   03/24/2022 Oncotype testing   Oncotype DX recurrence score: 30 (distant recurrence at 9 years with hormone therapy alone: 23%   04/10/2022 - 08/21/2022 Chemotherapy   Patient is on Treatment Plan : BREAST ADJUVANT DOSE DENSE AC q14d / PACLitaxel q7d     04/10/2022 - 09/04/2022 Chemotherapy   Patient is on Treatment Plan : BREAST ADJUVANT DOSE DENSE AC q14d / PACLitaxel q7d     10/04/2022 -  Anti-estrogen oral therapy   Anastrozole     INTERVAL HISTORY:  Madison Becker to review her survivorship care plan detailing her treatment course for breast cancer, as well as monitoring long-term side effects of that treatment, education regarding health maintenance, screening, and overall wellness and health promotion.      Overall, Madison Becker reports feeling quite well.  She is taking anastrozole daily.  Initially when she started the medication she experienced headaches.  She has a slight elevation in her BP as well.  She also notes increased arthralgias, particularly in her knees and lower back.  The lower back pain lasted for 4 days and then resolved.  The arthralgias in her knees have persisted.  She takes tylenol which will help her knee pain.  She is taking it as needed, which is not happening on a daily basis.    She also continues to experience peripheral neuropathy, worse in her toes.  It is located on the pads of her toes, accompanied with stabbing pains in her foot.  She started experiencing neuropathy with Taxol chemotherapy.  She is concerned that she is still experiencing the neuropathy.  She says the pain is less intense since completing chemo, however still present.  She has not tried anything to help with the discomfort.   She also notes some mid epigastric pain.  This has been going on intermittently and has been more intense for the past two weeks.  She denies any specific pattern with the pain, such as it occurring after meals, or when she has bloating or nausea.    REVIEW OF SYSTEMS:  Review of Systems  Constitutional:  Negative for appetite change, chills, fatigue, fever and unexpected weight change.  HENT:   Negative for hearing loss, lump/mass and trouble swallowing.   Eyes:  Negative for eye problems and icterus.  Respiratory:  Negative for chest tightness, cough and shortness of breath.   Cardiovascular:  Negative for chest pain, leg swelling and palpitations.  Gastrointestinal:  Positive for abdominal pain. Negative for abdominal distention, blood in stool, constipation, diarrhea, nausea and vomiting.  Endocrine: Negative for hot flashes.  Genitourinary:  Negative for difficulty urinating.   Musculoskeletal:  Positive for arthralgias.  Skin:  Negative for itching and rash.   Neurological:  Positive for numbness. Negative for dizziness, extremity weakness and headaches.  Hematological:  Negative for adenopathy. Does not bruise/bleed easily.  Psychiatric/Behavioral:  Negative for depression. The patient is not nervous/anxious.    Breast: Denies any new nodularity, masses, tenderness, nipple changes, or nipple discharge.       PAST MEDICAL/SURGICAL HISTORY:  Past Medical History:  Diagnosis Date   Breast cancer (HCC) 09/2021   GERD (gastroesophageal reflux disease)    History of cervical dysplasia    Port-A-Cath in place 04/09/2022   Past Surgical History:  Procedure Laterality Date   IR IMAGING GUIDED PORT INSERTION  04/09/2022   LEEP  2008   MASTECTOMY W/ SENTINEL NODE BIOPSY Left 03/05/2022   Procedure: LEFT MASTECTOMY WITH SENTINEL LYMPH NODE BIOPSY;  Surgeon: Abigail Miyamoto, MD;  Location: [facility];  Service: General;  Laterality: Left;   PORT-A-CATH REMOVAL Right 10/23/2022   Procedure: REMOVAL PORT-A-CATH;  Surgeon: Abigail Miyamoto, MD;  Location: [facility];  Service: General;  Laterality: Right;     ALLERGIES:  Allergies  Allergen Reactions   Beef-Derived Products     Does not eat beef   Pork-Derived Products     Does not eat pork     CURRENT MEDICATIONS:  Outpatient Encounter Medications as of 12/05/2022  Medication Sig   acetaminophen (TYLENOL) 500 MG tablet Take 1,000 mg by mouth every 6 (six) hours as needed for mild pain.   anastrozole (ARIMIDEX) 1 MG tablet Take 1 tablet (1 mg total) by mouth daily.   traMADol (ULTRAM) 50 MG tablet Take 1 tablet (50 mg total) by mouth every 6 (six) hours as needed for moderate pain or severe pain.   VITAMIN D PO Take by mouth.   No facility-administered encounter medications on file as of 12/05/2022.     ONCOLOGIC FAMILY HISTORY:  Family History  Problem Relation Age of Onset   Hypertension Mother    Deep vein thrombosis Mother    Coronary artery  disease Father    Breast cancer Maternal Aunt     SOCIAL HISTORY:  Social History   Socioeconomic History   Marital status: Single    Spouse name: Not on file   Number of children: Not on file   Years of education: Not on file   Highest education level: Not on file  Occupational History   Not on file  Tobacco Use   Smoking status: Former    Types: Cigarettes   Smokeless tobacco: Never  Vaping Use   Vaping Use: Never used  Substance and Sexual Activity   Alcohol use: Yes    Comment: occ   Drug use: Never   Sexual activity: Not on file  Other Topics Concern   Not on file  Social History Narrative   Not on file   Social Determinants of Health   Financial Resource Strain: Not on file  Food Insecurity: Not on file  Transportation Needs: Not on file  Physical Activity: Not on file  Stress: Not on file  Social Connections: Not on file  Intimate Partner Violence: Not  At Risk (10/22/2021)   Humiliation, Afraid, Rape, and Kick questionnaire    Fear of Current or Ex-Partner: No    Emotionally Abused: No    Physically Abused: No    Sexually Abused: No     OBSERVATIONS/OBJECTIVE:  BP (!) 140/84 (BP Location: Left Arm, Patient Position: Sitting)   Pulse 76   Temp (!) 97.3 F (36.3 C) (Temporal)   Resp 18   Ht 5\' 4"  (1.626 m)   Wt 203 lb 6.4 oz (92.3 kg)   SpO2 100%   BMI 34.91 kg/m  GENERAL: Patient is a well appearing female in no acute distress HEENT:  Sclerae anicteric.  Oropharynx clear and moist. No ulcerations or evidence of oropharyngeal candidiasis. Neck is supple.  NODES:  No cervical, supraclavicular, or axillary lymphadenopathy palpated.  BREAST EXAM: s/p left mastectomy, no sign of local recurrence, right breast benign LUNGS:  Clear to auscultation bilaterally.  No wheezes or rhonchi. HEART:  Regular rate and rhythm. No murmur appreciated. ABDOMEN:  Soft, nontender.  Positive, normoactive bowel sounds. No organomegaly palpated. MSK:  No focal spinal  tenderness to palpation. Full range of motion bilaterally in the upper extremities. EXTREMITIES:  No peripheral edema.   SKIN:  Clear with no obvious rashes or skin changes. No nail dyscrasia. NEURO:  Nonfocal. Well oriented.  Appropriate affect.   LABORATORY DATA:  None for this visit.  DIAGNOSTIC IMAGING:  None for this visit.      ASSESSMENT AND PLAN:  Madison Becker is a pleasant 66 y.o. female with Stage IIA left breast invasive ductal carcinoma, ER+/PR+/HER2-, diagnosed in 10/2021, treated with mastectomy, adjuvant chemotherapy, and antiestrogen therapy with Anastrozole daily.   She presents to the Survivorship Clinic for our initial meeting and routine follow-up post-completion of treatment for breast cancer.    1. Stage IIA left breast cancer:  Madison Becker is continuing to recover from definitive treatment for breast cancer. She will follow-up with her medical oncologist, Dr. Pamelia Hoit in  with history and physical exam per surveillance protocol.  She will continue her anti-estrogen therapy with Anastrozole.  She is experiencing some aching, and I recommended activity and ROM to help with this. Her right breast screening mammogram is due 11/2022; orders placed today. Today, a comprehensive survivorship care plan and treatment summary was reviewed with the patient today detailing her breast cancer diagnosis, treatment course, potential late/long-term effects of treatment, appropriate follow-up care with recommendations for the future, and patient education resources.  A copy of this summary, along with a letter will be sent to the patient's primary care provider via mail/fax/In Basket message after today's visit.    2. Arthralgias: we discussed ROM activities and exercise that can help with this.  If the aching persists, she may need to take a 2 week holiday from the anastrozole to see if it improves.  If it does, then we can try an different aromatase inhibitor, such as Letrozole.   3.  Peripheral neuropathy: This is improving.  I recommended that she take b12 supplementation which will help.  If it persists, we can always refer her to PT.    4.  Abdominal pain: We discussed her taking Famotidine BID if needed.  If the pain persists, she will let me know if it continues.    5. Bone health:  Given Madison Becker's age/history of breast cancer and her current treatment regimen including anti-estrogen therapy with Anastrozole, she is at risk for bone demineralization.  Her last DEXA scan was 10/01/2022, which  showed osteopenia with a t score of -1.4 int he spine and left femur.  She was recommended to repeat DEXA scan in 09/2024.  She was given education on specific activities to promote bone health.  6. Cancer screening:  Due to Madison Becker's history and her age, she should receive screening for skin cancers, colon cancer, and gynecologic cancers.  The information and recommendations are listed on the patient's comprehensive care plan/treatment summary and were reviewed in detail with the patient.    7. Health maintenance and wellness promotion: Madison Becker was encouraged to consume 5-7 servings of fruits and vegetables per day. We reviewed the "Nutrition Rainbow" handout.  She was also encouraged to engage in moderate to vigorous exercise for 30 minutes per day most days of the week. We discussed the [employer] fitness program, which is designed for cancer survivors to help them become more physically fit after cancer treatments.  She was instructed to limit her alcohol consumption and continue to abstain from tobacco use.     8. Support services/counseling: It is not uncommon for this period of the patient's cancer care trajectory to be one of many emotions and stressors. She was given information regarding our available services and encouraged to contact me with any questions or for help enrolling in any of our support group/programs.    Follow up instructions:    -Return to  cancer center 6 months for f/u with Dr. Pamelia Hoit  -DEXA 09/2024 -Mammogram due  -Follow up with surgery 2-3 months -She is welcome to return back to the Survivorship Clinic at any time; no additional follow-up needed at this time.  -Consider referral back to survivorship as a long-term survivor for continued surveillance  The patient was provided an opportunity to ask questions and all were answered. The patient agreed with the plan and demonstrated an understanding of the instructions.   Total encounter time:50 minutes*in face-to-face visit time, chart review, lab review, care coordination, order entry, and documentation of the encounter time.    Lillard Anes, NP 12/05/22 9:56 AM Medical Oncology and Hematology [facility] [address] [place], [place] [address] Tel. [phone]    Fax. [phone]  *Total Encounter Time as defined by the Centers for Medicare and Medicaid Services includes, in addition to the face-to-face time of a patient visit (documented in the note above) non-face-to-face time: obtaining and reviewing outside history, ordering and reviewing medications, tests or procedures, care coordination (communications with other health care professionals or caregivers) and documentation in the medical record.

## 2022-12-12 ENCOUNTER — Encounter: Payer: Self-pay | Admitting: Hematology and Oncology

## 2022-12-19 LAB — SIGNATERA ONLY (NATERA MANAGED)
SIGNATERA MTM READOUT: 0 MTM/ml
SIGNATERA TEST RESULT: NEGATIVE

## 2022-12-24 ENCOUNTER — Encounter (HOSPITAL_COMMUNITY): Payer: Self-pay

## 2022-12-26 ENCOUNTER — Other Ambulatory Visit: Payer: Self-pay

## 2022-12-26 DIAGNOSIS — Z9012 Acquired absence of left breast and nipple: Secondary | ICD-10-CM

## 2022-12-26 DIAGNOSIS — N644 Mastodynia: Secondary | ICD-10-CM

## 2022-12-26 DIAGNOSIS — C50212 Malignant neoplasm of upper-inner quadrant of left female breast: Secondary | ICD-10-CM

## 2023-01-06 ENCOUNTER — Encounter: Payer: Self-pay | Admitting: *Deleted

## 2023-01-06 NOTE — Progress Notes (Signed)
Nursing staff attempt x2 to contact patient regarding updated insurance for Madison Becker.  Pt has not responded to our calls and [employer] testing will be placed on hold at this time until pt returns call to the office.

## 2023-01-08 ENCOUNTER — Ambulatory Visit
Admission: RE | Admit: 2023-01-08 | Discharge: 2023-01-08 | Disposition: A | Discharge status: Home / Self Care | Payer: No Typology Code available for payment source | Source: Ambulatory Visit | Attending: Obstetrics and Gynecology | Admitting: Obstetrics and Gynecology

## 2023-01-08 ENCOUNTER — Ambulatory Visit: Payer: Self-pay | Admitting: Hematology and Oncology

## 2023-01-08 ENCOUNTER — Other Ambulatory Visit: Payer: Self-pay | Admitting: Obstetrics and Gynecology

## 2023-01-08 VITALS — BP 144/95 | Wt 206.0 lb

## 2023-01-08 DIAGNOSIS — Z9012 Acquired absence of left breast and nipple: Secondary | ICD-10-CM

## 2023-01-08 DIAGNOSIS — C50212 Malignant neoplasm of upper-inner quadrant of left female breast: Secondary | ICD-10-CM

## 2023-01-08 DIAGNOSIS — N644 Mastodynia: Secondary | ICD-10-CM

## 2023-01-08 MED ORDER — CEPHALEXIN 500 MG PO CAPS: 500.0000 mg | ORAL_CAPSULE | Freq: Two times a day (BID) | ORAL | 0 refills | Status: DC

## 2023-01-08 NOTE — Progress Notes (Signed)
Ms. Madison Becker is a 67 y.o. female who presents to [facility] clinic today with no complaints.    Pap Smear: Pap not smear completed today. Last Pap smear was 2022 and was normal. Per patient has no history of an abnormal Pap smear. Last Pap smear result is not available in Epic.   Physical exam: Breasts Breasts symmetrical. No skin abnormalities bilateral breasts. No nipple retraction bilateral breasts. No nipple discharge bilateral breasts. No lymphadenopathy. No lumps palpated bilateral breasts.        Pelvic/Bimanual Pap is not indicated today    Smoking History: Patient has is a former smoker and was not referred to quit line.    Patient Navigation: Patient education provided. Access to services provided for patient through [facility] program. No interpreter provided. No transportation provided   Colorectal Cancer Screening: Per patient has never had colonoscopy completed No complaints today. Declines FIT testing today.   Breast and Cervical Cancer Risk Assessment: Patient has family history of breast cancer, with her great aunt. Patient does not have history of cervical dysplasia, immunocompromised, or DES exposure in-utero.  Risk Assessment   No risk assessment data     A: B[facility]CP exam without pap smear No complaints with benign exam. Patient underwent left mastectomy in 2023 for malignant neoplasm of left breast. She has completed chemotherapy and remains on anastrozole doing well.   P: Referred patient to the [place] for a diagnostic mammogram. Appointment scheduled 01/08/23.  Melodye Ped, NP 01/08/2023 1:39 PM

## 2023-01-08 NOTE — Patient Instructions (Signed)
Taught Madison Becker about BSE and gave educational materials to take home. Patient did not need a Pap smear today due to last Pap smear was in 2022 per patient. Let her know [facility] will cover Pap smears every 5 years unless has a history of abnormal Pap smears. Referred patient to the [facility] for diagnostic mammogram. Appointment scheduled for 01/08/2023. Patient aware of appointment and will be there. Let patient know will follow up with her within the next couple weeks with results. Madison Becker verbalized understanding.  Melodye Ped, NP 1:45 PM

## 2023-01-09 ENCOUNTER — Telehealth: Payer: Self-pay | Admitting: *Deleted

## 2023-01-09 NOTE — Telephone Encounter (Addendum)
Message from [place] with [place] [phone]).  Reviewing disability claim for Dia Crawford, D.O.B. 22-Aug-1956.  I need to know if Dr. Lindi Adie is restricting patient from work.  If someone could please call me back.  Bettye still hasn't returned to work, she's not answering my calls anymore so I don't know if there's a reason she's not answering my calls or a reason she is not supposed to be on disability.  Thank you."  This nurse unable to  reach patient and a daughter contact [employer]'s mailbox too full to leave a message.

## 2023-01-10 ENCOUNTER — Encounter: Payer: Self-pay | Admitting: Hematology and Oncology

## 2023-01-12 NOTE — Telephone Encounter (Addendum)
Completed call to Madison Becker, "Apologized for delay returning last week's message and request to return call".   Informed her of collaborative calls to her for [employer] testing. [employer] message forwarded to forms staff, reports inability to reach her regarding disability.  Does provider intend to keep her out of work.   This nurse advised most recent [employer] form signed 05/26/2022 perhaps expired within six months.  Disability process is for you to contact [employer] to request updated claim paperwork be faxed to provider.   "I am not working.  That's not true, I have spoken with [employer] about recent US and mammogram.  Their goal is to release me and terminate the LTD.  They want information from provider to verify if able to return.  Reapplied to previous employer [employer] and others but no answer yet.  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  Symptoms, experiencing headache within thirty minutes after daily anastrozole.  Headache comes and goes, not a migraine but an annoying, ongoing headache interfering with concentration and feeling tired most of the time.  Neuropathy to feet, feeling heavy, dragging them concentrating to remember to pick them up so I don't stumble and use B12." "They're calling me now."  Currently no further questions or needs.  Before call ended, advised this nurse awaiting disability paperwork, unnecessary to call [employer].  Future pending [employer] calls for clinical action or patient guidance are managed by provider and collaborative.

## 2023-01-13 ENCOUNTER — Telehealth: Payer: Self-pay

## 2023-01-13 NOTE — Telephone Encounter (Signed)
Notified Patient of completion of Attending Physician Statement. Fax transmission confirmation received. Copy of form mailed to Patient as requested. No other needs or concerns voiced at this time.

## 2023-01-28 ENCOUNTER — Telehealth: Payer: Self-pay

## 2023-01-28 NOTE — Telephone Encounter (Signed)
Patient informed [facility] Medicaid approved with effective dates, 09/21/2022-06/21/2023, will send information regarding Medicare programs. MID # [id number].

## 2023-01-29 NOTE — Progress Notes (Signed)
Received call from Dr. Bridgett Larsson of [place] regarding medical request form. Verbally confirmed with Dr. Bridgett Larsson that patient is unable to perform work duties at this time related to anti-estrogen therapy side effects. Referred Dr. Bridgett Larsson to paperwork faxed by [employer] Register on 01/15/2023. Dr. Bridgett Larsson verbalized understanding with no further questions at this time.

## 2023-02-04 NOTE — Telephone Encounter (Signed)
Called pt per MD to advise [employer] testing was negative/not detected. Pt verbalized understanding of results and knows [employer] will be in touch to schedule 3 mo repeat lab.   

## 2023-03-18 LAB — SIGNATERA
SIGNATERA MTM READOUT: 0 MTM/ml
SIGNATERA TEST RESULT: NEGATIVE

## 2023-05-22 ENCOUNTER — Other Ambulatory Visit: Payer: Self-pay | Admitting: Family Medicine

## 2023-05-22 ENCOUNTER — Ambulatory Visit
Admission: RE | Admit: 2023-05-22 | Discharge: 2023-05-22 | Disposition: A | Discharge status: Home / Self Care | Payer: No Typology Code available for payment source | Source: Ambulatory Visit | Attending: Family Medicine | Admitting: Family Medicine

## 2023-05-22 DIAGNOSIS — M25562 Pain in left knee: Secondary | ICD-10-CM

## 2023-05-25 NOTE — Progress Notes (Signed)
Patient Care Team: Pcp, No as PCP - General Pershing Proud, RN as Oncology Nurse Navigator Donnelly Angelica, RN as Oncology Nurse Navigator Serena Croissant, MD as Consulting Physician (Hematology and Oncology) Abigail Miyamoto, MD as Consulting Physician (General Surgery)  DIAGNOSIS:  Encounter Diagnosis  Name Primary?   Malignant neoplasm of upper-inner quadrant of left breast in female, estrogen receptor positive (HCC) Yes    SUMMARY OF ONCOLOGIC HISTORY: Oncology History  Malignant neoplasm of upper-inner quadrant of left breast in female, estrogen receptor positive (HCC)  10/22/2021 Initial Diagnosis   Palpable left breast mass. Diagnostic mammogram and [place]: an irregular mass within the upper inner left breast, IDC 2.2 cm ER 99%, PR 99%, HER2 negative, Ki-67 50%.  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  Total area of involvement 6 cm   10/22/2021 Cancer Staging   Staging form: Breast, AJCC 8th Edition - Clinical stage from 10/22/2021: Stage IIA (cT2, cN0(f), cM0, G3, ER+, PR+, HER2-) - Signed by Serena Croissant, MD on 01/02/2022 Stage prefix: Initial diagnosis Method of lymph node assessment: Core biopsy Histologic grading system: 3 grade system   11/04/2021 Breast MRI   2.5 cm irregular enhancing mass UIQ left breast IDC with DCIS.  Additional non-mass enhancement extends anterior and inferior to the mass 2.5 cm total span 7.1 cm.   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, focal high-grade DCIS, margins negative, 1/2 lymph nodes positive ER 99%, PR 99%, HER2 negative, Ki-67 50%   03/24/2022 Oncotype testing   Oncotype DX recurrence score: 30 (distant recurrence at 9 years with hormone therapy alone: 23%   04/10/2022 - 08/21/2022 Chemotherapy   Patient is on Treatment Plan : BREAST ADJUVANT DOSE DENSE AC q14d / PACLitaxel q7d     04/10/2022 - 09/04/2022 Chemotherapy   Patient is on  Treatment Plan : BREAST ADJUVANT DOSE DENSE AC q14d / PACLitaxel q7d     10/04/2022 -  Anti-estrogen oral therapy   Anastrozole     CHIEF COMPLIANT: Follow-up on anastrozole therapy  INTERVAL HISTORY: Madison Becker is a 67  y.o with the above-mentioned history of breast cancer who is currently on oral antiestrogen therapy with anastrozole.  She is complaining of tenderness in the lower axillary area and some decreased range of motion of the left arm.  She has seen a primary care physician who performed extensive workup and found her to have high cholesterol and slightly elevated hemoglobin A1c as well as hypertension.  She started on hydrochlorothiazide and is working on her problems 1 at a time.  ALLERGIES:  is allergic to beef-derived products and pork-derived products.  MEDICATIONS:  Current Outpatient Medications  Medication Sig Dispense Refill   hydrochlorothiazide (HYDRODIURIL) 25 MG tablet Take 1 tablet (25 mg total) by mouth daily.     acetaminophen (TYLENOL) 500 MG tablet Take 1,000 mg by mouth every 6 (six) hours as needed for mild pain.     anastrozole (ARIMIDEX) 1 MG tablet Take 1 tablet (1 mg total) by mouth daily. 90 tablet 3   Multiple Vitamin (MULTIVITAMIN) capsule Take 1 capsule by mouth daily.     VITAMIN D PO Take by mouth.     No current facility-administered medications for this visit.    PHYSICAL EXAMINATION: ECOG PERFORMANCE STATUS: 1 - Symptomatic but completely ambulatory  Vitals:   06/08/23 0943  BP: (!) 164/83  Pulse: 78  Resp:  18  Temp: 97.9 F (36.6 C)  SpO2: 100%   Filed Weights   06/08/23 0943  Weight: 212 lb 14.4 oz (96.6 kg)    BREAST: No palpable lumps or nodules in the right breast.  No lumps or nodules in the left chest wall or axilla.  Tenderness in the left axillary fold.  (exam performed in the presence of a chaperone)  LABORATORY DATA:  I have reviewed the data as listed    Latest Ref Rng & Units 09/04/2022   11:46 AM 08/28/2022     7:55 AM 08/21/2022    8:52 AM  CMP  Glucose 70 - 99 mg/dL 161  096  94   BUN 8 - 23 mg/dL 17  14  9    Creatinine 0.44 - 1.00 mg/dL 0.45  4.09  8.11   Sodium 135 - 145 mmol/L 141  141  139   Potassium 3.5 - 5.1 mmol/L 3.7  3.8  3.8   Chloride 98 - 111 mmol/L 109  110  106   CO2 22 - 32 mmol/L 27  25  28    Calcium 8.9 - 10.3 mg/dL 9.2  9.2  9.1   Total Protein 6.5 - 8.1 g/dL 6.4  6.6  6.5   Total Bilirubin 0.3 - 1.2 mg/dL 0.5  0.4  0.4   Alkaline Phos 38 - 126 U/L 63  60  61   AST 15 - 41 U/L 17  17  20    ALT 0 - 44 U/L 17  19  23      Lab Results  Component Value Date   WBC 2.5 (L) 09/04/2022   HGB 9.6 (L) 09/04/2022   HCT 29.7 (L) 09/04/2022   MCV 93.7 09/04/2022   PLT 142 (L) 09/04/2022   NEUTROABS 1.4 (L) 09/04/2022    ASSESSMENT & PLAN:  Malignant neoplasm of upper-inner quadrant of left breast in female, estrogen receptor positive (HCC) 03/05/2022:Left mastectomy: 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  Adjuvant chemotherapy with dose dense Adriamycin and Cytoxan x4 followed by Taxol x12 2. adjuvant antiestrogen therapy with anastrozole 1 mg daily started 10/04/2022 --------------------------------------------------------------------------------------------------------------- Anastrozole toxicities: Tolerating it fairly well.  Breast cancer surveillance: Breast exam 06/08/2023: Benign Mammogram right breast: 01/08/2023: Benign breast density category B Bone density 10/01/2022: T-score -1.4  Left shoulder pain and decreased range of motion: Will refer her to physical therapy.  Patient went to see a primary care physician and was found to have elevated cholesterol and high blood pressure.  Also positive for ANA and she is being referred for  rheumatology.  Return to clinic in 1 year for follow-up    Orders Placed This Encounter  Procedures   Ambulatory referral to Physical Therapy    Referral Priority:   Routine    Referral Type:   Physical Medicine    Referral Reason:   Specialty Services Required    Requested Specialty:   Physical Therapy    Number of Visits Requested:   1   The patient has a good understanding of the overall plan. she agrees with it. she will call with any problems that may develop before the next visit here. Total time spent: 30 mins including face to face time and time spent for planning, charting and co-ordination of care   [place]  Marcelline Mates, MD 06/08/23    I Janan Ridge am acting as a Neurosurgeon for [employer]  I have reviewed the above documentation for accuracy and completeness, and I agree with the above.

## 2023-06-08 ENCOUNTER — Inpatient Hospital Stay: Payer: Medicaid Other | Attending: Hematology and Oncology | Admitting: Hematology and Oncology

## 2023-06-08 ENCOUNTER — Other Ambulatory Visit (HOSPITAL_BASED_OUTPATIENT_CLINIC_OR_DEPARTMENT_OTHER): Payer: Self-pay

## 2023-06-08 ENCOUNTER — Other Ambulatory Visit: Payer: Self-pay

## 2023-06-08 VITALS — BP 164/83 | HR 78 | Temp 97.9°F | Resp 18 | Ht 64.0 in | Wt 212.9 lb

## 2023-06-08 DIAGNOSIS — Z79811 Long term (current) use of aromatase inhibitors: Secondary | ICD-10-CM | POA: Diagnosis not present

## 2023-06-08 DIAGNOSIS — M25512 Pain in left shoulder: Secondary | ICD-10-CM | POA: Diagnosis not present

## 2023-06-08 DIAGNOSIS — I10 Essential (primary) hypertension: Secondary | ICD-10-CM | POA: Insufficient documentation

## 2023-06-08 DIAGNOSIS — E7800 Pure hypercholesterolemia, unspecified: Secondary | ICD-10-CM | POA: Diagnosis not present

## 2023-06-08 DIAGNOSIS — C50212 Malignant neoplasm of upper-inner quadrant of left female breast: Secondary | ICD-10-CM | POA: Insufficient documentation

## 2023-06-08 DIAGNOSIS — Z9012 Acquired absence of left breast and nipple: Secondary | ICD-10-CM | POA: Insufficient documentation

## 2023-06-08 DIAGNOSIS — Z9221 Personal history of antineoplastic chemotherapy: Secondary | ICD-10-CM | POA: Diagnosis not present

## 2023-06-08 DIAGNOSIS — Z170 Estrogen receptor positive status [ER+]: Secondary | ICD-10-CM | POA: Insufficient documentation

## 2023-06-08 MED ORDER — HYDROCHLOROTHIAZIDE 25 MG PO TABS: 25.0000 mg | ORAL_TABLET | Freq: Every day | ORAL | Status: AC

## 2023-06-08 MED ORDER — ANASTROZOLE 1 MG PO TABS
1.0000 mg | ORAL_TABLET | Freq: Every day | ORAL | 3 refills | Status: DC
  Filled 2023-06-08: qty 90, 90d supply, fill #0
  Filled 2023-12-28: qty 90, 90d supply, fill #1
  Filled 2024-04-28: qty 90, 90d supply, fill #2

## 2023-06-08 NOTE — Assessment & Plan Note (Addendum)
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  Adjuvant chemotherapy with dose dense Adriamycin and Cytoxan x4 followed by Taxol x12 2. adjuvant antiestrogen therapy with anastrozole 1 mg daily started 10/04/2022 --------------------------------------------------------------------------------------------------------------- Anastrozole toxicities: Tolerating it fairly well.  Breast cancer surveillance: Breast exam 06/08/2023: Benign Mammogram right breast: 01/08/2023: Benign breast density category B Bone density 10/01/2022: T-score -1.4  Left shoulder pain and decreased range of motion: Will refer her to physical therapy.  Patient went to see a primary care physician and was found to have elevated cholesterol and high blood pressure.  Also positive for ANA and she is being referred for rheumatology.  Return to clinic in 1 year for follow-up

## 2023-06-10 ENCOUNTER — Encounter: Payer: Self-pay | Admitting: *Deleted

## 2023-06-10 NOTE — Progress Notes (Signed)
Pt requesting scrip for bra fitting be faxed to [facility] [place].  RN successfully faxed script.

## 2023-06-11 ENCOUNTER — Telehealth: Payer: Self-pay | Admitting: Hematology and Oncology

## 2023-06-11 ENCOUNTER — Encounter: Payer: Self-pay | Admitting: Rehabilitation

## 2023-06-11 ENCOUNTER — Ambulatory Visit: Payer: Medicaid Other | Attending: Hematology and Oncology | Admitting: Rehabilitation

## 2023-06-11 ENCOUNTER — Other Ambulatory Visit: Payer: Self-pay

## 2023-06-11 DIAGNOSIS — M25612 Stiffness of left shoulder, not elsewhere classified: Secondary | ICD-10-CM | POA: Diagnosis present

## 2023-06-11 DIAGNOSIS — I972 Postmastectomy lymphedema syndrome: Secondary | ICD-10-CM | POA: Diagnosis present

## 2023-06-11 DIAGNOSIS — Z9012 Acquired absence of left breast and nipple: Secondary | ICD-10-CM | POA: Diagnosis present

## 2023-06-11 DIAGNOSIS — Z170 Estrogen receptor positive status [ER+]: Secondary | ICD-10-CM | POA: Insufficient documentation

## 2023-06-11 DIAGNOSIS — C50212 Malignant neoplasm of upper-inner quadrant of left female breast: Secondary | ICD-10-CM | POA: Diagnosis present

## 2023-06-11 NOTE — Telephone Encounter (Signed)
Scheduled appointment per 6/17 los. Patient is aware of the made appointments. 

## 2023-06-11 NOTE — Therapy (Addendum)
OUTPATIENT PHYSICAL THERAPY  UPPER EXTREMITY ONCOLOGY EVALUATION  Patient Name: Madison Becker MRN: [id number] DOB:[birth year], 67 y.o., female Today's Date: 06/11/2023  END OF SESSION:  PT End of Session - 06/11/23 1446     Visit Number 1    Number of Visits 13    Date for PT Re-Evaluation 07/23/23    Authorization - Number of Visits 27    PT Start Time 1400    PT Stop Time 1455    PT Time Calculation (min) 55 min    Activity Tolerance Patient tolerated treatment well    Behavior During Therapy [facility] for tasks assessed/performed             Past Medical History:  Diagnosis Date   Breast cancer (HCC) 09/2021   GERD (gastroesophageal reflux disease)    History of cervical dysplasia    Port-A-Cath in place 04/09/2022   Past Surgical History:  Procedure Laterality Date   IR IMAGING GUIDED PORT INSERTION  04/09/2022   LEEP  2008   MASTECTOMY W/ SENTINEL NODE BIOPSY Left 03/05/2022   Procedure: LEFT MASTECTOMY WITH SENTINEL LYMPH NODE BIOPSY;  Surgeon: Abigail Miyamoto, MD;  Location: [facility];  Service: General;  Laterality: Left;   PORT-A-CATH REMOVAL Right 10/23/2022   Procedure: REMOVAL PORT-A-CATH;  Surgeon: Abigail Miyamoto, MD;  Location: [facility];  Service: General;  Laterality: Right;   Patient Active Problem List   Diagnosis Date Noted   Hilar adenopathy 05/08/2022   Multifocal pneumonia 05/07/2022   S/P left mastectomy 03/05/2022   Malignant neoplasm of upper-inner quadrant of left breast in female, estrogen receptor positive (HCC) 10/22/2021    PCP: None  REFERRING PROVIDER: Dr. Pamelia Hoit  REFERRING DIAG: Left arm stiffness , Lt mastectomy   THERAPY DIAG:  Malignant neoplasm of upper-inner quadrant of left breast in female, estrogen receptor positive (HCC)  S/P left mastectomy  Stiffness of left shoulder, not elsewhere classified  Post Mastectomy Lymphedema   ONSET DATE: 03/05/22  Rationale for Evaluation and  Treatment: Rehabilitation  SUBJECTIVE:                                                                                                                                                                                           SUBJECTIVE STATEMENT: Initially it didn't bother me much.  Recently I noted tightness and some elbow pain.  I also noted that my hand strength has decreased.  I have to hold pots with 2 hands now.  It has been around 3 months.  Sleeping at night is also hard because it starts to get uncomfortable.    PERTINENT HISTORY: Left  mastectomy 03/05/22 with 1 positive node removed of 2.  Completed chemotherapy on antiestrogens.   PAIN:  Are you having pain? Yes it doesn't hurt at rest NPRS scale: 5/10 Pain location: Left elbow and then the shoulder  Pain orientation: Left  PAIN TYPE: dull and tight Pain description: intermittent  Aggravating factors: sleeping, using the arm a lot  Relieving factors: salon pas patch, rest   PRECAUTIONS: left lymphedema risk   WEIGHT BEARING RESTRICTIONS: No  FALLS:  Has patient fallen in last 6 months? 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     OBJECTIVE:  COGNITION: Overall cognitive status: Within functional limits for tasks assessed   PALPATION: No pitting. PROM mainly full with no significant pain or pulling. Scar moving well.   OBSERVATIONS / OTHER ASSESSMENTS: skin vs edema left lateral chest  POSTURE: rounded shoulders   UPPER EXTREMITY AROM/PROM:  A/PROM RIGHT   eval   Shoulder extension 55  Shoulder flexion 155  Shoulder abduction 160  Shoulder internal rotation   Shoulder external rotation 85    (Blank rows = not tested)  A/PROM LEFT   eval  Shoulder extension 60  Shoulder  flexion 160  Shoulder abduction 160  Shoulder internal rotation   Shoulder external rotation 80 - tight in axilla     (Blank rows = not tested)  UPPER EXTREMITY STRENGTH:   LYMPHEDEMA ASSESSMENTS:   LANDMARK RIGHT  eval  At axilla  35  15 cm proximal to olecranon process 34  10 cm proximal to olecranon process 33  Olecranon process 29.8  15 cm proximal to ulnar styloid process 25.1  10 cm proximal to ulnar styloid process 21.3  Just proximal to ulnar styloid process 17.5  Across hand at thumb web space 20.2  At base of 2nd digit 6.9  (Blank rows = not tested)  LANDMARK LEFT  eval  At axilla  37  15 cm proximal to olecranon process 35.5  10 cm proximal to olecranon process 35  Olecranon process 29.8  15 cm proximal to ulnar styloid process 25.6  10 cm proximal to ulnar styloid process 21.6  Just proximal to ulnar styloid process 18.5  Across hand at thumb web space 20.2  At base of 2nd digit 6.7  (Blank rows = not tested) 11  difference   L-DEX LYMPHEDEMA SCREENING: (no baseline) L-DEX LYMPHEDEMA SCREENING Measurement Type: Unilateral L-DEX MEASUREMENT EXTREMITY: Upper Extremity POSITION : Standing DOMINANT SIDE: Right At Risk Side: Left L-DEX SCORE (UNILATERAL): 12.9 Comment: no baseline  QUICK DASH SURVEY: 43%   TODAY'S TREATMENT:  DATE: 06/11/23  Eval performed Encouraged AROM, elevation, and distal to proximal milking type massage until next visits.   PATIENT EDUCATION:  Education details: POC options, Person educated: Patient Education method: Explanation Education comprehension: verbalized understanding  HOME EXERCISE PROGRAM: Encouraged AROM, elevation, and distal to proximal milking type massage until next visits.   ASSESSMENT:  CLINICAL IMPRESSION: Patient is a 67 y.o. female who was  seen today for physical therapy evaluation and treatment for a new onset of Lt arm pain and stiffness which seems to be the onset of lymphedema.  Her SOZO is 12.9, volume measurement difference is and the arm is similar in size except for the 1.5-2cm at the proximal upper arm. She could probably do compression vs bandaging but due to the 2cm size difference proximally we will start with bandaging.  Pt would benefit from learning bandaging, lymphedema self care education, and getting into compression for lymphedema care.  Her elbow symptoms seem to be from new onset of swelling and pain was not reproduced during exam.  She also reports stiffness but had full AROM except for ER so tightness may be more mild in nature in the pectoralis.   OBJECTIVE IMPAIRMENTS: decreased activity tolerance, decreased knowledge of condition, decreased knowledge of use of DME, and increased edema.   ACTIVITY LIMITATIONS: none  PARTICIPATION LIMITATIONS: yard work  PERSONAL FACTORS: SLNB are also affecting patient's functional outcome.   REHAB POTENTIAL: Excellent  CLINICAL DECISION MAKING: Stable/uncomplicated  EVALUATION COMPLEXITY: Low  GOALS: Goals reviewed with patient? Yes  SHORT TERM GOALS = LTGs: Target date: 07/23/23  Pt will plateau left UE reduction to be measured for UE compression Baseline: Goal status: INITIAL  2.  Pt will be ind with self MLD for the UE Baseline:  Goal status: INITIAL  3.  Pt will be ind with self bandaging and where to get supplies  Baseline:  Goal status: INITIAL  4.  Pt will be ind with stretches for any continued tightness Baseline:  Goal status: INITIAL  PLAN:  PT FREQUENCY: 2x/week (pt may be ok with 3 if needed but wanted to try self bandage)  PT DURATION: 6 weeks  PLANNED INTERVENTIONS: Therapeutic exercises, Therapeutic activity, Patient/Family education, Self Care, Orthotic/Fit training, Prosthetic training, DME instructions, Manual therapy, and  re-eval  PLAN FOR NEXT SESSION: CDT Lt UE, starting to teach pt self bandaging ASAP, have pt call about DME providers or send to [employer] for benefits  Idamae Lusher, PT 06/11/2023, 2:47 PM

## 2023-06-15 ENCOUNTER — Ambulatory Visit: Payer: Medicaid Other | Admitting: Physical Therapy

## 2023-06-15 ENCOUNTER — Encounter: Payer: Self-pay | Admitting: Physical Therapy

## 2023-06-15 DIAGNOSIS — C50212 Malignant neoplasm of upper-inner quadrant of left female breast: Secondary | ICD-10-CM | POA: Diagnosis not present

## 2023-06-15 DIAGNOSIS — I972 Postmastectomy lymphedema syndrome: Secondary | ICD-10-CM

## 2023-06-15 DIAGNOSIS — Z9012 Acquired absence of left breast and nipple: Secondary | ICD-10-CM

## 2023-06-15 DIAGNOSIS — M25612 Stiffness of left shoulder, not elsewhere classified: Secondary | ICD-10-CM

## 2023-06-15 NOTE — Therapy (Signed)
OUTPATIENT PHYSICAL THERAPY  UPPER EXTREMITY ONCOLOGY TREATMENT  Patient Name: Madison Becker MRN: [id number] DOB:Nov 16, 1956, 67 y.o., female Today's Date: 06/15/2023  END OF SESSION:  PT End of Session - 06/15/23 0955     Visit Number 2    Number of Visits 13    Date for PT Re-Evaluation 07/23/23    PT Start Time 0907    PT Stop Time 0954    PT Time Calculation (min) 47 min    Activity Tolerance Patient tolerated treatment well    Behavior During Therapy [facility] for tasks assessed/performed              Past Medical History:  Diagnosis Date   Breast cancer (HCC) 09/2021   GERD (gastroesophageal reflux disease)    History of cervical dysplasia    Port-A-Cath in place 04/09/2022   Past Surgical History:  Procedure Laterality Date   IR IMAGING GUIDED PORT INSERTION  04/09/2022   LEEP  2008   MASTECTOMY W/ SENTINEL NODE BIOPSY Left 03/05/2022   Procedure: LEFT MASTECTOMY WITH SENTINEL LYMPH NODE BIOPSY;  Surgeon: Abigail Miyamoto, MD;  Location: [facility];  Service: General;  Laterality: Left;   PORT-A-CATH REMOVAL Right 10/23/2022   Procedure: REMOVAL PORT-A-CATH;  Surgeon: Abigail Miyamoto, MD;  Location: [facility];  Service: General;  Laterality: Right;   Patient Active Problem List   Diagnosis Date Noted   Hilar adenopathy 05/08/2022   Multifocal pneumonia 05/07/2022   S/P left mastectomy 03/05/2022   Malignant neoplasm of upper-inner quadrant of left breast in female, estrogen receptor positive (HCC) 10/22/2021    PCP: None  REFERRING PROVIDER: Dr. Pamelia Hoit  REFERRING DIAG: Left arm stiffness , Lt mastectomy   THERAPY DIAG:  Postmastectomy lymphedema  Stiffness of left shoulder, not elsewhere classified  S/P left mastectomy  Malignant neoplasm of upper-inner quadrant of left breast in female, estrogen receptor positive (HCC)  ONSET DATE: 03/05/22  Rationale for Evaluation and Treatment: Rehabilitation  SUBJECTIVE:                                                                                                                                                                                            SUBJECTIVE STATEMENT: I tried to wrap my arm with Coban and I had to wake up in the middle of the night to take it off because it hurt.   PERTINENT HISTORY: Left mastectomy 03/05/22 with 1 positive node removed of 2.  Completed chemotherapy on antiestrogens.   PAIN:  Are you having pain? Yes  NPRS scale: 5/10 Pain location: Left elbow and then the shoulder  Pain orientation: Left  PAIN TYPE: achy Pain description: intermittent  Aggravating factors: sleeping, using the arm a lot  Relieving factors: salon pas patch, rest   PRECAUTIONS: left lymphedema risk   WEIGHT BEARING RESTRICTIONS: No  FALLS:  Has patient fallen in last 6 months? 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     OBJECTIVE:  COGNITION: Overall cognitive status: Within functional limits for tasks assessed   PALPATION: No pitting. PROM mainly full with no significant pain or pulling. Scar moving well.   OBSERVATIONS / OTHER ASSESSMENTS: skin vs edema left lateral chest  POSTURE: rounded shoulders   UPPER EXTREMITY AROM/PROM:  A/PROM RIGHT   eval   Shoulder extension 55  Shoulder flexion 155  Shoulder abduction 160  Shoulder internal rotation   Shoulder external rotation 85    (Blank rows = not tested)  A/PROM LEFT   eval  Shoulder extension 60  Shoulder flexion 160  Shoulder abduction 160  Shoulder internal rotation   Shoulder external rotation 80 - tight in axilla     (Blank rows = not tested)  UPPER EXTREMITY STRENGTH:   LYMPHEDEMA ASSESSMENTS:   LANDMARK RIGHT  eval  At axilla  35   15 cm proximal to olecranon process 34  10 cm proximal to olecranon process 33  Olecranon process 29.8  15 cm proximal to ulnar styloid process 25.1  10 cm proximal to ulnar styloid process 21.3  Just proximal to ulnar styloid process 17.5  Across hand at thumb web space 20.2  At base of 2nd digit 6.9  (Blank rows = not tested)  LANDMARK LEFT  eval  At axilla  37  15 cm proximal to olecranon process 35.5  10 cm proximal to olecranon process 35  Olecranon process 29.8  15 cm proximal to ulnar styloid process 25.6  10 cm proximal to ulnar styloid process 21.6  Just proximal to ulnar styloid process 18.5  Across hand at thumb web space 20.2  At base of 2nd digit 6.7  (Blank rows = not tested) 11  difference   L-DEX LYMPHEDEMA SCREENING: (no baseline)    QUICK DASH SURVEY: 43%   TODAY'S TREATMENT:                                                                                                                                          DATE:  06/15/23: Instructed pt in self bandaging today and therapist demonstrate each step then had pt return demonstrate as follows: medium TG soft, elastamull to digits 1-5, artiflex from hand to axilla, 1 6 cm bandage at hand, 1 8 cm bandage from wrist to axilla, 1  10 cm bandage from wrist to axilla. Therapist removed and reapplied banages at the end so pt could feel what pressure the bandages should have.  Educated pt to remove bandages if hand turns blue, there is pain that does not go away when she moves, numbness or tingling that does not go away when she moves  06/11/23  Eval performed Encouraged AROM, elevation, and distal to proximal milking type massage until next visits.   PATIENT EDUCATION:  Education details: self bandaging, MD Dareen Piano how to bandage your own arm Person educated: Patient Education method: Explanation, Demonstration, Tactile cues, and Verbal cues Education comprehension: verbalized  understanding, returned demonstration, verbal cues required, and needs further education  HOME EXERCISE PROGRAM: Practice self bandaging Encouraged AROM, elevation, and distal to proximal milking type massage until next visits.   ASSESSMENT:  CLINICAL IMPRESSION: Began instructing pt in self bandaging and had pt return demonstrate. Educated pt to watch MD Anderson bandaging your own arm video so she can practice at home. Will assess independence at next session and begin instructing in self MLD once she is independent in bandaging.   OBJECTIVE IMPAIRMENTS: decreased activity tolerance, decreased knowledge of condition, decreased knowledge of use of DME, and increased edema.   ACTIVITY LIMITATIONS: none  PARTICIPATION LIMITATIONS: yard work  PERSONAL FACTORS: SLNB are also affecting patient's functional outcome.   REHAB POTENTIAL: Excellent  CLINICAL DECISION MAKING: Stable/uncomplicated  EVALUATION COMPLEXITY: Low  GOALS: Goals reviewed with patient? Yes  SHORT TERM GOALS = LTGs: Target date: 07/23/23  Pt will plateau left UE reduction to be measured for UE compression Baseline: Goal status: INITIAL  2.  Pt will be ind with self MLD for the UE Baseline:  Goal status: INITIAL  3.  Pt will be ind with self bandaging and where to get supplies  Baseline:  Goal status: INITIAL  4.  Pt will be ind with stretches for any continued tightness Baseline:  Goal status: INITIAL  PLAN:  PT FREQUENCY: 2x/week (pt may be ok with 3 if needed but wanted to try self bandage)  PT DURATION: 6 weeks  PLANNED INTERVENTIONS: Therapeutic exercises, Therapeutic activity, Patient/Family education, Self Care, Orthotic/Fit training, Prosthetic training, DME instructions, Manual therapy, and re-eval  PLAN FOR NEXT SESSION: CDT Lt UE, assess independence with self bandaging, instruct pt in self MLD, have pt call about DME providers or send to [employer] for benefits  [employer],  PT 06/15/2023, 10:02 AM

## 2023-06-17 ENCOUNTER — Ambulatory Visit: Payer: Medicaid Other | Admitting: Physical Therapy

## 2023-06-17 ENCOUNTER — Encounter: Payer: Self-pay | Admitting: Physical Therapy

## 2023-06-17 DIAGNOSIS — I972 Postmastectomy lymphedema syndrome: Secondary | ICD-10-CM

## 2023-06-17 DIAGNOSIS — M25612 Stiffness of left shoulder, not elsewhere classified: Secondary | ICD-10-CM

## 2023-06-17 DIAGNOSIS — Z170 Estrogen receptor positive status [ER+]: Secondary | ICD-10-CM

## 2023-06-17 DIAGNOSIS — C50212 Malignant neoplasm of upper-inner quadrant of left female breast: Secondary | ICD-10-CM | POA: Diagnosis not present

## 2023-06-17 DIAGNOSIS — Z9012 Acquired absence of left breast and nipple: Secondary | ICD-10-CM

## 2023-06-17 NOTE — Therapy (Signed)
OUTPATIENT PHYSICAL THERAPY  UPPER EXTREMITY ONCOLOGY TREATMENT  Patient Name: Madison Becker MRN: [id number] DOB:15-Nov-1956, 67 y.o., female Today's Date: 06/17/2023  END OF SESSION:  PT End of Session - 06/17/23 0857     Visit Number 3    Number of Visits 13    Date for PT Re-Evaluation 07/23/23    PT Start Time 0802    PT Stop Time 0857    PT Time Calculation (min) 55 min    Activity Tolerance Patient tolerated treatment well    Behavior During Therapy [facility] for tasks assessed/performed               Past Medical History:  Diagnosis Date   Breast cancer (HCC) 09/2021   GERD (gastroesophageal reflux disease)    History of cervical dysplasia    Port-A-Cath in place 04/09/2022   Past Surgical History:  Procedure Laterality Date   IR IMAGING GUIDED PORT INSERTION  04/09/2022   LEEP  2008   MASTECTOMY W/ SENTINEL NODE BIOPSY Left 03/05/2022   Procedure: LEFT MASTECTOMY WITH SENTINEL LYMPH NODE BIOPSY;  Surgeon: Abigail Miyamoto, MD;  Location: [facility];  Service: General;  Laterality: Left;   PORT-A-CATH REMOVAL Right 10/23/2022   Procedure: REMOVAL PORT-A-CATH;  Surgeon: Abigail Miyamoto, MD;  [facility];  Service: General;  Laterality: Right;   Patient Active Problem List   Diagnosis Date Noted   Hilar adenopathy 05/08/2022   Multifocal pneumonia 05/07/2022   S/P left mastectomy 03/05/2022   Malignant neoplasm of upper-inner quadrant of left breast in female, estrogen receptor positive (HCC) 10/22/2021    PCP: None  REFERRING PROVIDER: Dr. Pamelia Hoit  REFERRING DIAG: Left arm stiffness , Lt mastectomy   THERAPY DIAG:  Postmastectomy lymphedema  Stiffness of left shoulder, not elsewhere classified  S/P left mastectomy  Malignant neoplasm of upper-inner quadrant of left breast in female, estrogen receptor positive (HCC)  ONSET DATE: 03/05/22  Rationale for Evaluation and Treatment: Rehabilitation  SUBJECTIVE:                                                                                                                                                                                            SUBJECTIVE STATEMENT: I rewrapped my arm this morning.   PERTINENT HISTORY: Left mastectomy 03/05/22 with 1 positive node removed of 2.  Completed chemotherapy on antiestrogens.   PAIN:  Are you having pain? Yes  NPRS scale: 5/10 Pain location: Left elbow and then the shoulder  Pain orientation: Left  PAIN TYPE: achy Pain description: intermittent  Aggravating factors: sleeping, using the arm a lot  Relieving factors: salon  pas patch, rest   PRECAUTIONS: left lymphedema risk   WEIGHT BEARING RESTRICTIONS: No  FALLS:  Has patient fallen in last 6 months? 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     OBJECTIVE:  COGNITION: Overall cognitive status: Within functional limits for tasks assessed   PALPATION: No pitting. PROM mainly full with no significant pain or pulling. Scar moving well.   OBSERVATIONS / OTHER ASSESSMENTS: skin vs edema left lateral chest  POSTURE: rounded shoulders   UPPER EXTREMITY AROM/PROM:  A/PROM RIGHT   eval   Shoulder extension 55  Shoulder flexion 155  Shoulder abduction 160  Shoulder internal rotation   Shoulder external rotation 85    (Blank rows = not tested)  A/PROM LEFT   eval  Shoulder extension 60  Shoulder flexion 160  Shoulder abduction 160  Shoulder internal rotation   Shoulder external rotation 80 - tight in axilla     (Blank rows = not tested)  UPPER EXTREMITY STRENGTH:   LYMPHEDEMA ASSESSMENTS:   LANDMARK RIGHT  eval  At axilla  35  15 cm proximal to olecranon process 34  10 cm proximal to olecranon process  33  Olecranon process 29.8  15 cm proximal to ulnar styloid process 25.1  10 cm proximal to ulnar styloid process 21.3  Just proximal to ulnar styloid process 17.5  Across hand at thumb web space 20.2  At base of 2nd digit 6.9  (Blank rows = not tested)  LANDMARK LEFT  eval LEFT 06/17/23  At axilla  37 36.6  15 cm proximal to olecranon process 35.5 33.7  10 cm proximal to olecranon process 35 33  Olecranon process 29.8 27.8  15 cm proximal to ulnar styloid process 25.6 25.3  10 cm proximal to ulnar styloid process 21.6 21.7  Just proximal to ulnar styloid process 18.5 17.3  Across hand at thumb web space 20.2 18.8  At base of 2nd digit 6.7 6.1  (Blank rows = not tested) 11  difference   L-DEX LYMPHEDEMA SCREENING: (no baseline)    QUICK DASH SURVEY: 43%   TODAY'S TREATMENT:                                                                                                                                          DATE:  06/17/23: Removed bandages and assessed pt's independence. She did an excellent job bandaging.  Remeasured circumferences Measured for a compression sleeve and glove: Sigvarus Secure M2 sleeve and small glove and issued info on how to obtain Manual lymph drainage in supine as follows: short neck, right axillary nodes, left inguinal nodes, 5 diaphragmatic  breaths; anterior inter-axillary anastamoses, left axillo-inguinal anastamoses. Left upper extremity from fingers and dorsal hand to lateral shoulder redirecting along pathways. Instructed pt throughout on correct sequence, skin stretch and pressure.  Compression bandaging as follows: medium TG soft, elastamull to digits 1-5, artiflex from hand to axilla, 1 6 cm bandage at hand, 1 8 cm bandage from wrist to axilla, 1 10 cm bandage from wrist to axilla.  06/15/23: Instructed pt in self bandaging today and therapist demonstrate each step then had pt return demonstrate as follows: medium TG soft,  elastamull to digits 1-5, artiflex from hand to axilla, 1 6 cm bandage at hand, 1 8 cm bandage from wrist to axilla, 1 10 cm bandage from wrist to axilla. Therapist removed and reapplied banages at the end so pt could feel what pressure the bandages should have.  Educated pt to remove bandages if hand turns blue, there is pain that does not go away when she moves, numbness or tingling that does not go away when she moves  06/11/23  Eval performed Encouraged AROM, elevation, and distal to proximal milking type massage until next visits.   PATIENT EDUCATION:  Education details: self bandaging, MD Dareen Piano how to bandage your own arm Person educated: Patient Education method: Explanation, Demonstration, Tactile cues, and Verbal cues Education comprehension: verbalized understanding, returned demonstration, verbal cues required, and needs further education  HOME EXERCISE PROGRAM: Practice self bandaging Encouraged AROM, elevation, and distal to proximal milking type massage until next visits.   ASSESSMENT:  CLINICAL IMPRESSION: Pt did an excellent job bandaging her own arm. Began MLD today to LUE and instructed pt throughout. Will have pt return demonstrate each step next session. Measured for compression sleeve and glove and issued info on how to obtain them. Pt demonstrates excellent reduction in circumferences today after bandaging.   OBJECTIVE IMPAIRMENTS: decreased activity tolerance, decreased knowledge of condition, decreased knowledge of use of DME, and increased edema.   ACTIVITY LIMITATIONS: none  PARTICIPATION LIMITATIONS: yard work  PERSONAL FACTORS: SLNB are also affecting patient's functional outcome.   REHAB POTENTIAL: Excellent  CLINICAL DECISION MAKING: Stable/uncomplicated  EVALUATION COMPLEXITY: Low  GOALS: Goals reviewed with patient? Yes  SHORT TERM GOALS = LTGs: Target date: 07/23/23  Pt will plateau left UE reduction to be measured for UE  compression Baseline: Goal status: INITIAL  2.  Pt will be ind with self MLD for the UE Baseline:  Goal status: INITIAL  3.  Pt will be ind with self bandaging and where to get supplies  Baseline:  Goal status: INITIAL  4.  Pt will be ind with stretches for any continued tightness Baseline:  Goal status: INITIAL  PLAN:  PT FREQUENCY: 2x/week (pt may be ok with 3 if needed but wanted to try self bandage)  PT DURATION: 6 weeks  PLANNED INTERVENTIONS: Therapeutic exercises, Therapeutic activity, Patient/Family education, Self Care, Orthotic/Fit training, Prosthetic training, DME instructions, Manual therapy, and re-eval  PLAN FOR NEXT SESSION: did she order sleeve? CDT Lt UE, assess independence with self bandaging, instruct pt in self MLD, have pt call about DME providers or send to [employer] for benefits  [employer], PT 06/17/2023, 9:02 AM

## 2023-06-23 ENCOUNTER — Encounter: Payer: Self-pay | Admitting: Physical Therapy

## 2023-06-23 ENCOUNTER — Ambulatory Visit: Payer: Medicare Other | Attending: Hematology and Oncology | Admitting: Physical Therapy

## 2023-06-23 DIAGNOSIS — Z170 Estrogen receptor positive status [ER+]: Secondary | ICD-10-CM | POA: Insufficient documentation

## 2023-06-23 DIAGNOSIS — I972 Postmastectomy lymphedema syndrome: Secondary | ICD-10-CM | POA: Diagnosis present

## 2023-06-23 DIAGNOSIS — M25612 Stiffness of left shoulder, not elsewhere classified: Secondary | ICD-10-CM | POA: Insufficient documentation

## 2023-06-23 DIAGNOSIS — C50212 Malignant neoplasm of upper-inner quadrant of left female breast: Secondary | ICD-10-CM | POA: Insufficient documentation

## 2023-06-23 DIAGNOSIS — Z9012 Acquired absence of left breast and nipple: Secondary | ICD-10-CM | POA: Diagnosis present

## 2023-06-23 NOTE — Therapy (Signed)
OUTPATIENT PHYSICAL THERAPY  UPPER EXTREMITY ONCOLOGY TREATMENT  Patient Name: Madison Becker MRN: [id number] DOB:05/31/1956, 67 y.o., female Today's Date: 06/23/2023  END OF SESSION:  PT End of Session - 06/23/23 1000     Visit Number 4    Number of Visits 13    Date for PT Re-Evaluation 07/23/23    PT Start Time 0901    PT Stop Time 0958    PT Time Calculation (min) 57 min    Activity Tolerance Patient tolerated treatment well    Behavior During Therapy [facility] for tasks assessed/performed                Past Medical History:  Diagnosis Date   Breast cancer (HCC) 09/2021   GERD (gastroesophageal reflux disease)    History of cervical dysplasia    Port-A-Cath in place 04/09/2022   Past Surgical History:  Procedure Laterality Date   IR IMAGING GUIDED PORT INSERTION  04/09/2022   LEEP  2008   MASTECTOMY W/ SENTINEL NODE BIOPSY Left 03/05/2022   Procedure: LEFT MASTECTOMY WITH SENTINEL LYMPH NODE BIOPSY;  Surgeon: Abigail Miyamoto, MD;  Location: [facility];  Service: General;  Laterality: Left;   PORT-A-CATH REMOVAL Right 10/23/2022   Procedure: REMOVAL PORT-A-CATH;  Surgeon: Abigail Miyamoto, MD;  Location: [facility];  Service: General;  Laterality: Right;   Patient Active Problem List   Diagnosis Date Noted   Hilar adenopathy 05/08/2022   Multifocal pneumonia 05/07/2022   S/P left mastectomy 03/05/2022   Malignant neoplasm of upper-inner quadrant of left breast in female, estrogen receptor positive (HCC) 10/22/2021    PCP: None  REFERRING PROVIDER: Dr. Pamelia Hoit  REFERRING DIAG: Left arm stiffness , Lt mastectomy   THERAPY DIAG:  Postmastectomy lymphedema  Stiffness of left shoulder, not elsewhere classified  S/P left mastectomy  Malignant neoplasm of upper-inner quadrant of left breast in female, estrogen receptor positive (HCC)  ONSET DATE: 03/05/22  Rationale for Evaluation and Treatment: Rehabilitation  SUBJECTIVE:                                                                                                                                                                                            SUBJECTIVE STATEMENT: The elbow is less achy and it comes and goes. I have been wrapping my arm.   PERTINENT HISTORY: Left mastectomy 03/05/22 with 1 positive node removed of 2.  Completed chemotherapy on antiestrogens.   PAIN:  Are you having pain? Yes  NPRS scale: 3/10 Pain location: Left elbow and then the shoulder  Pain orientation: Left  PAIN TYPE: achy Pain description: intermittent  Aggravating  factors: sleeping, using the arm a lot  Relieving factors: salon pas patch, rest   PRECAUTIONS: left lymphedema risk   WEIGHT BEARING RESTRICTIONS: No  FALLS:  Has patient fallen in last 6 months? 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     OBJECTIVE:  COGNITION: Overall cognitive status: Within functional limits for tasks assessed   PALPATION: No pitting. PROM mainly full with no significant pain or pulling. Scar moving well.   OBSERVATIONS / OTHER ASSESSMENTS: skin vs edema left lateral chest  POSTURE: rounded shoulders   UPPER EXTREMITY AROM/PROM:  A/PROM RIGHT   eval   Shoulder extension 55  Shoulder flexion 155  Shoulder abduction 160  Shoulder internal rotation   Shoulder external rotation 85    (Blank rows = not tested)  A/PROM LEFT   eval  Shoulder extension 60  Shoulder flexion 160  Shoulder abduction 160  Shoulder internal rotation   Shoulder external rotation 80 - tight in axilla     (Blank rows = not tested)  UPPER EXTREMITY STRENGTH:   LYMPHEDEMA ASSESSMENTS:   LANDMARK RIGHT  eval  At axilla  35  15 cm proximal to olecranon process  34  10 cm proximal to olecranon process 33  Olecranon process 29.8  15 cm proximal to ulnar styloid process 25.1  10 cm proximal to ulnar styloid process 21.3  Just proximal to ulnar styloid process 17.5  Across hand at thumb web space 20.2  At base of 2nd digit 6.9  (Blank rows = not tested)  LANDMARK LEFT  eval LEFT 06/17/23 LEFT 06/23/23  At axilla  37 36.6 37.7  15 cm proximal to olecranon process 35.5 33.7 33.4  10 cm proximal to olecranon process 35 33 33  Olecranon process 29.8 27.8 27.1  15 cm proximal to ulnar styloid process 25.6 25.3 24.6  10 cm proximal to ulnar styloid process 21.6 21.7 21.4  Just proximal to ulnar styloid process 18.5 17.3 17  Across hand at thumb web space 20.2 18.8 18.2  At base of 2nd digit 6.7 6.1 6  (Blank rows = not tested) 11  difference   L-DEX LYMPHEDEMA SCREENING: (no baseline)    QUICK DASH SURVEY: 43%   TODAY'S TREATMENT:                                                                                                                                          DATE:  06/23/23: Removed bandages and remeasured circumferences Manual lymph drainage in supine as follows: short neck, right axillary nodes, left inguinal nodes, 5 diaphragmatic breaths; anterior inter-axillary anastamoses, left axillo-inguinal anastamoses. Left upper  extremity from fingers and dorsal hand to lateral shoulder redirecting along pathways. Compression bandaging as follows: medium TG soft, elastamull to digits 1-5, artiflex from hand to axilla, 1 6 cm bandage at hand, 1 8 cm bandage from wrist to axilla, 1 10 cm bandage from wrist to axilla.  06/17/23: Removed bandages and assessed pt's independence. She did an excellent job bandaging.  Remeasured circumferences Measured for a compression sleeve and glove: Sigvarus Secure M2 sleeve and small glove and issued info on how to obtain Manual lymph drainage in supine as follows: short neck, right  axillary nodes, left inguinal nodes, 5 diaphragmatic breaths; anterior inter-axillary anastamoses, left axillo-inguinal anastamoses. Left upper extremity from fingers and dorsal hand to lateral shoulder redirecting along pathways. Instructed pt throughout on correct sequence, skin stretch and pressure.  Compression bandaging as follows: medium TG soft, elastamull to digits 1-5, artiflex from hand to axilla, 1 6 cm bandage at hand, 1 8 cm bandage from wrist to axilla, 1 10 cm bandage from wrist to axilla.  06/15/23: Instructed pt in self bandaging today and therapist demonstrate each step then had pt return demonstrate as follows: medium TG soft, elastamull to digits 1-5, artiflex from hand to axilla, 1 6 cm bandage at hand, 1 8 cm bandage from wrist to axilla, 1 10 cm bandage from wrist to axilla. Therapist removed and reapplied banages at the end so pt could feel what pressure the bandages should have.  Educated pt to remove bandages if hand turns blue, there is pain that does not go away when she moves, numbness or tingling that does not go away when she moves  06/11/23  Eval performed Encouraged AROM, elevation, and distal to proximal milking type massage until next visits.   PATIENT EDUCATION:  Education details: self bandaging, MD Dareen Piano how to bandage your own arm Person educated: Patient Education method: Explanation, Demonstration, Tactile cues, and Verbal cues Education comprehension: verbalized understanding, returned demonstration, verbal cues required, and needs further education  HOME EXERCISE PROGRAM: Practice self bandaging Encouraged AROM, elevation, and distal to proximal milking type massage until next visits.   ASSESSMENT:  CLINICAL IMPRESSION: Continued with MLD and compression bandaging. Pt demonstrates further reduction of edema in LUE. She is planning to order a compression sleeve this week for long term management. Will assess for fit once it arrives.   OBJECTIVE  IMPAIRMENTS: decreased activity tolerance, decreased knowledge of condition, decreased knowledge of use of DME, and increased edema.   ACTIVITY LIMITATIONS: none  PARTICIPATION LIMITATIONS: yard work  PERSONAL FACTORS: SLNB are also affecting patient's functional outcome.   REHAB POTENTIAL: Excellent  CLINICAL DECISION MAKING: Stable/uncomplicated  EVALUATION COMPLEXITY: Low  GOALS: Goals reviewed with patient? Yes  SHORT TERM GOALS = LTGs: Target date: 07/23/23  Pt will plateau left UE reduction to be measured for UE compression Baseline: Goal status: INITIAL  2.  Pt will be ind with self MLD for the UE Baseline:  Goal status: INITIAL  3.  Pt will be ind with self bandaging and where to get supplies  Baseline:  Goal status: INITIAL  4.  Pt will be ind with stretches for any continued tightness Baseline:  Goal status: INITIAL  PLAN:  PT FREQUENCY: 2x/week (pt may be ok with 3 if needed but wanted to try self bandage)  PT DURATION: 6 weeks  PLANNED INTERVENTIONS: Therapeutic exercises, Therapeutic activity, Patient/Family education, Self Care, Orthotic/Fit training, Prosthetic training, DME instructions, Manual therapy, and re-eval  PLAN FOR NEXT SESSION: did she order  sleeve? CDT Lt UE, assess independence with self bandaging, instruct pt in self MLD, have pt call about DME providers or send to [employer] for benefits  [employer], PT 06/23/2023, 10:01 AM

## 2023-06-24 LAB — SIGNATERA

## 2023-06-26 ENCOUNTER — Ambulatory Visit: Payer: Medicare Other | Admitting: Physical Therapy

## 2023-06-26 DIAGNOSIS — M25612 Stiffness of left shoulder, not elsewhere classified: Secondary | ICD-10-CM

## 2023-06-26 DIAGNOSIS — Z9012 Acquired absence of left breast and nipple: Secondary | ICD-10-CM

## 2023-06-26 DIAGNOSIS — C50212 Malignant neoplasm of upper-inner quadrant of left female breast: Secondary | ICD-10-CM

## 2023-06-26 DIAGNOSIS — I972 Postmastectomy lymphedema syndrome: Secondary | ICD-10-CM

## 2023-06-26 NOTE — Therapy (Signed)
OUTPATIENT PHYSICAL THERAPY  UPPER EXTREMITY ONCOLOGY TREATMENT  Patient Name: Madison Becker MRN: [id number] DOB:1956-06-08, 67 y.o., female Today's Date: 06/26/2023  END OF SESSION:  PT End of Session - 06/26/23 1057     Visit Number 5    Number of Visits 13    Date for PT Re-Evaluation 07/23/23    PT Start Time 1000    PT Stop Time 1053    PT Time Calculation (min) 53 min    Activity Tolerance Patient tolerated treatment well    Behavior During Therapy [facility] for tasks assessed/performed                 Past Medical History:  Diagnosis Date   Breast cancer (HCC) 09/2021   GERD (gastroesophageal reflux disease)    History of cervical dysplasia    Port-A-Cath in place 04/09/2022   Past Surgical History:  Procedure Laterality Date   IR IMAGING GUIDED PORT INSERTION  04/09/2022   LEEP  2008   MASTECTOMY W/ SENTINEL NODE BIOPSY Left 03/05/2022   Procedure: LEFT MASTECTOMY WITH SENTINEL LYMPH NODE BIOPSY;  Surgeon: Abigail Miyamoto, MD;  Location: [facility];  Service: General;  Laterality: Left;   PORT-A-CATH REMOVAL Right 10/23/2022   Procedure: REMOVAL PORT-A-CATH;  Surgeon: Abigail Miyamoto, MD;  Location: [facility];  Service: General;  Laterality: Right;   Patient Active Problem List   Diagnosis Date Noted   Hilar adenopathy 05/08/2022   Multifocal pneumonia 05/07/2022   S/P left mastectomy 03/05/2022   Malignant neoplasm of upper-inner quadrant of left breast in female, estrogen receptor positive (HCC) 10/22/2021    PCP: None  REFERRING PROVIDER: Dr. Pamelia Hoit  REFERRING DIAG: Left arm stiffness , Lt mastectomy   THERAPY DIAG:  Postmastectomy lymphedema  Stiffness of left shoulder, not elsewhere classified  S/P left mastectomy  Malignant neoplasm of upper-inner quadrant of left breast in female, estrogen receptor positive (HCC)  ONSET DATE: 03/05/22  Rationale for Evaluation and Treatment: Rehabilitation  SUBJECTIVE:                                                                                                                                                                                            SUBJECTIVE STATEMENT: Pt states that she ordered a sleeve from [employer]. It should be here tomorrow and she will bring it next visit. Pt has been wrapping her arm and has been keeping compression on more than 22 hours a day.  She says her elbow feels better today   PERTINENT HISTORY: Left mastectomy 03/05/22 with 1 positive node removed of 2.  Completed chemotherapy on antiestrogens. No radiation  PAIN:  Are you having pain? Yes  NPRS scale: 3/10  Pain location: mosly lateral elbow and then tightness in the  shoulder  Pain orientation: Left  PAIN TYPE: achy Pain description: intermittent  Aggravating factors: sleeping, using the arm a lot  Relieving factors: salon pas patch, rest   PRECAUTIONS: left lymphedema risk   WEIGHT BEARING RESTRICTIONS: No  FALLS:  Has patient fallen in last 6 months? 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     OBJECTIVE:  COGNITION: Overall cognitive status: Within functional limits for tasks assessed   PALPATION: No pitting. PROM mainly full with no significant pain or pulling. Scar moving well.   OBSERVATIONS / OTHER ASSESSMENTS: skin vs edema left lateral chest  POSTURE: rounded shoulders   UPPER EXTREMITY AROM/PROM:  A/PROM RIGHT   eval   Shoulder extension 55  Shoulder flexion 155  Shoulder abduction 160  Shoulder internal rotation   Shoulder external rotation 85    (Blank rows = not tested)  A/PROM LEFT   eval  Shoulder extension 60  Shoulder flexion 160  Shoulder abduction 160  Shoulder internal rotation   Shoulder  external rotation 80 - tight in axilla     (Blank rows = not tested)  UPPER EXTREMITY STRENGTH:   LYMPHEDEMA ASSESSMENTS:   LANDMARK RIGHT  eval  At axilla  35  15 cm proximal to olecranon process 34  10 cm proximal to olecranon process 33  Olecranon process 29.8  15 cm proximal to ulnar styloid process 25.1  10 cm proximal to ulnar styloid process 21.3  Just proximal to ulnar styloid process 17.5  Across hand at thumb web space 20.2  At base of 2nd digit 6.9  (Blank rows = not tested)  LANDMARK LEFT  eval LEFT 06/17/23 LEFT 06/23/23  At axilla  37 36.6 37.7  15 cm proximal to olecranon process 35.5 33.7 33.4  10 cm proximal to olecranon process 35 33 33  Olecranon process 29.8 27.8 27.1  15 cm proximal to ulnar styloid process 25.6 25.3 24.6  10 cm proximal to ulnar styloid process 21.6 21.7 21.4  Just proximal to ulnar styloid process 18.5 17.3 17  Across hand at thumb web space 20.2 18.8 18.2  At base of 2nd digit 6.7 6.1 6  (Blank rows = not tested) 11  difference   L-DEX LYMPHEDEMA SCREENING: (no baseline)    QUICK DASH SURVEY: 43%   TODAY'S TREATMENT:  DATE:  06/24/2023: Py removed bandages that she had applied and washed arm at sink Manual lymph drainage in supine as follows: short neck, right axillary nodes, left inguinal nodes, 5 diaphragmatic breaths; anterior inter-axillary anastamoses, left axillo-inguinal anastamoses. Left upper extremity from fingers and dorsal hand to lateral shoulder redirecting along pathways.then to sidelying for posterior interaxillary anastamosis, Pt also did left UE shoulder ROM and deep breathing.  Compression bandaging as follows: medium TG soft, elastamull to digits 1-5, artiflex from hand to axilla, 1 6 cm bandage at hand, 1 8 cm bandage from wrist to axilla, 1 10 cm bandage  from wrist to axilla.  06/23/23: Removed bandages and remeasured circumferences Manual lymph drainage in supine as follows: short neck, right axillary nodes, left inguinal nodes, 5 diaphragmatic breaths; anterior inter-axillary anastamoses, left axillo-inguinal anastamoses. Left upper extremity from fingers and dorsal hand to lateral shoulder redirecting along pathways. Compression bandaging as follows: medium TG soft, elastamull to digits 1-5, artiflex from hand to axilla, 1 6 cm bandage at hand, 1 8 cm bandage from wrist to axilla, 1 10 cm bandage from wrist to axilla.  06/17/23: Removed bandages and assessed pt's independence. She did an excellent job bandaging.  Remeasured circumferences Measured for a compression sleeve and glove: Sigvarus Secure M2 sleeve and small glove and issued info on how to obtain Manual lymph drainage in supine as follows: short neck, right axillary nodes, left inguinal nodes, 5 diaphragmatic breaths; anterior inter-axillary anastamoses, left axillo-inguinal anastamoses. Left upper extremity from fingers and dorsal hand to lateral shoulder redirecting along pathways. Instructed pt throughout on correct sequence, skin stretch and pressure.  Compression bandaging as follows: medium TG soft, elastamull to digits 1-5, artiflex from hand to axilla, 1 6 cm bandage at hand, 1 8 cm bandage from wrist to axilla, 1 10 cm bandage from wrist to axilla.  06/15/23: Instructed pt in self bandaging today and therapist demonstrate each step then had pt return demonstrate as follows: medium TG soft, elastamull to digits 1-5, artiflex from hand to axilla, 1 6 cm bandage at hand, 1 8 cm bandage from wrist to axilla, 1 10 cm bandage from wrist to axilla. Therapist removed and reapplied banages at the end so pt could feel what pressure the bandages should have.  Educated pt to remove bandages if hand turns blue, there is pain that does not go away when she moves, numbness or tingling that does not  go away when she moves  06/11/23  Eval performed Encouraged AROM, elevation, and distal to proximal milking type massage until next visits.   PATIENT EDUCATION:  Education details: self bandaging, MD Dareen Piano how to bandage your own arm Person educated: Patient Education method: Explanation, Demonstration, Tactile cues, and Verbal cues Education comprehension: verbalized understanding, returned demonstration, verbal cues required, and needs further education  HOME EXERCISE PROGRAM: Practice self bandaging Encouraged AROM, elevation, and distal to proximal milking type massage until next visits.   ASSESSMENT:  CLINICAL IMPRESSION: Pt is improving and reports that she is able to better manage her lymphedema at home Continued with MLD and compression bandaging. She anticipates getting her compression sleeve tomorrow   OBJECTIVE IMPAIRMENTS: decreased activity tolerance, decreased knowledge of condition, decreased knowledge of use of DME, and increased edema.   ACTIVITY LIMITATIONS: none  PARTICIPATION LIMITATIONS: yard work  PERSONAL FACTORS: SLNB are also affecting patient's functional outcome.   REHAB POTENTIAL: Excellent  CLINICAL DECISION MAKING: Stable/uncomplicated  EVALUATION COMPLEXITY: Low  GOALS: Goals reviewed with patient? Yes  SHORT  TERM GOALS = LTGs: Target date: 07/23/23  Pt will plateau left UE reduction to be measured for UE compression Baseline: Goal status: INITIAL  2.  Pt will be ind with self MLD for the UE Baseline:  Goal status: INITIAL  3.  Pt will be ind with self bandaging and where to get supplies  Baseline:  Goal status: INITIAL  4.  Pt will be ind with stretches for any continued tightness Baseline:  Goal status: INITIAL  PLAN:  PT FREQUENCY: 2x/week (pt may be ok with 3 if needed but wanted to try self bandage)  PT DURATION: 6 weeks  PLANNED INTERVENTIONS: Therapeutic exercises, Therapeutic activity, Patient/Family education, Self  Care, Orthotic/Fit training, Prosthetic training, DME instructions, Manual therapy, and re-eval  PLAN FOR NEXT SESSION: check sleeve CDT Lt UE, assess independence with self bandaging, instruct pt in self MLD,   Bayard Hugger. Manson Passey PT  Donnetta Hail, PT 06/26/2023, 10:58 [facility] [address] [place], [place], [address] Phone: [phone]   Fax:  [phone]  Patient Details  Name: Adea Trupia MRN: [id number] Date of Birth: 1956/05/14 Referring Provider:  Serena Croissant, MD  Encounter Date: 06/26/2023   Donnetta Hail, PT 06/26/2023, 10:58 AM  [facility] [address] [place], [place], [address] Phone: [phone]   Fax:  [phone]

## 2023-06-29 ENCOUNTER — Ambulatory Visit: Payer: Medicare Other | Admitting: Physical Therapy

## 2023-06-29 ENCOUNTER — Encounter: Payer: Self-pay | Admitting: Physical Therapy

## 2023-06-29 DIAGNOSIS — Z170 Estrogen receptor positive status [ER+]: Secondary | ICD-10-CM

## 2023-06-29 DIAGNOSIS — I972 Postmastectomy lymphedema syndrome: Secondary | ICD-10-CM

## 2023-06-29 DIAGNOSIS — Z9012 Acquired absence of left breast and nipple: Secondary | ICD-10-CM

## 2023-06-29 DIAGNOSIS — M25612 Stiffness of left shoulder, not elsewhere classified: Secondary | ICD-10-CM

## 2023-06-29 NOTE — Therapy (Signed)
OUTPATIENT PHYSICAL THERAPY  UPPER EXTREMITY ONCOLOGY TREATMENT  Patient Name: Madison Becker MRN: [id number] DOB:12-18-1956, 67 y.o., female Today's Date: 06/29/2023  END OF SESSION:  PT End of Session - 06/29/23 0901     Visit Number 6    Number of Visits 13    Date for PT Re-Evaluation 07/23/23    PT Start Time 0805    PT Stop Time 0900    PT Time Calculation (min) 55 min    Activity Tolerance Patient tolerated treatment well    Behavior During Therapy [facility] for tasks assessed/performed                  Past Medical History:  Diagnosis Date   Breast cancer (HCC) 09/2021   GERD (gastroesophageal reflux disease)    History of cervical dysplasia    Port-A-Cath in place 04/09/2022   Past Surgical History:  Procedure Laterality Date   IR IMAGING GUIDED PORT INSERTION  04/09/2022   LEEP  2008   MASTECTOMY W/ SENTINEL NODE BIOPSY Left 03/05/2022   Procedure: LEFT MASTECTOMY WITH SENTINEL LYMPH NODE BIOPSY;  Surgeon: Abigail Miyamoto, MD;  Location: [facility];  Service: General;  Laterality: Left;   PORT-A-CATH REMOVAL Right 10/23/2022   Procedure: REMOVAL PORT-A-CATH;  Surgeon: Abigail Miyamoto, MD;  Location: [facility];  Service: General;  Laterality: Right;   Patient Active Problem List   Diagnosis Date Noted   Hilar adenopathy 05/08/2022   Multifocal pneumonia 05/07/2022   S/P left mastectomy 03/05/2022   Malignant neoplasm of upper-inner quadrant of left breast in female, estrogen receptor positive (HCC) 10/22/2021    PCP: None  REFERRING PROVIDER: Dr. Pamelia Hoit  REFERRING DIAG: Left arm stiffness , Lt mastectomy   THERAPY DIAG:  Postmastectomy lymphedema  Stiffness of left shoulder, not elsewhere classified  S/P left mastectomy  Malignant neoplasm of upper-inner quadrant of left breast in female, estrogen receptor positive (HCC)  ONSET DATE: 03/05/22  Rationale for Evaluation and Treatment: Rehabilitation  SUBJECTIVE:                                                                                                                                                                                            SUBJECTIVE STATEMENT: I got my sleeve from [employer]. I want you to look at it.   PERTINENT HISTORY: Left mastectomy 03/05/22 with 1 positive node removed of 2.  Completed chemotherapy on antiestrogens. No radiation  PAIN:  Are you having pain? Yes  NPRS scale: 3/10  Pain location: mosly lateral elbow and then tightness in the  shoulder  Pain orientation: Left  PAIN TYPE: achy  Pain description: intermittent  Aggravating factors: sleeping, using the arm a lot  Relieving factors: salon pas patch, rest   PRECAUTIONS: left lymphedema risk   WEIGHT BEARING RESTRICTIONS: No  FALLS:  Has patient fallen in last 6 months? 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     OBJECTIVE:  COGNITION: Overall cognitive status: Within functional limits for tasks assessed   PALPATION: No pitting. PROM mainly full with no significant pain or pulling. Scar moving well.   OBSERVATIONS / OTHER ASSESSMENTS: skin vs edema left lateral chest  POSTURE: rounded shoulders   UPPER EXTREMITY AROM/PROM:  A/PROM RIGHT   eval   Shoulder extension 55  Shoulder flexion 155  Shoulder abduction 160  Shoulder internal rotation   Shoulder external rotation 85    (Blank rows = not tested)  A/PROM LEFT   eval  Shoulder extension 60  Shoulder flexion 160  Shoulder abduction 160  Shoulder internal rotation   Shoulder external rotation 80 - tight in axilla     (Blank rows = not tested)  UPPER EXTREMITY STRENGTH:   LYMPHEDEMA ASSESSMENTS:   LANDMARK RIGHT  eval  At axilla  35  15 cm proximal  to olecranon process 34  10 cm proximal to olecranon process 33  Olecranon process 29.8  15 cm proximal to ulnar styloid process 25.1  10 cm proximal to ulnar styloid process 21.3  Just proximal to ulnar styloid process 17.5  Across hand at thumb web space 20.2  At base of 2nd digit 6.9  (Blank rows = not tested)  LANDMARK LEFT  eval LEFT 06/17/23 LEFT 06/23/23 LEFT 06/29/23  At axilla  37 36.6 37.7 37.2  15 cm proximal to olecranon process 35.5 33.7 33.4 34.3  10 cm proximal to olecranon process 35 33 33 34.4  Olecranon process 29.8 27.8 27.1 27.1  15 cm proximal to ulnar styloid process 25.6 25.3 24.6 25.2  10 cm proximal to ulnar styloid process 21.6 21.7 21.4 20.6  Just proximal to ulnar styloid process 18.5 17.3 17 17.5  Across hand at thumb web space 20.2 18.8 18.2 19.7  At base of 2nd digit 6.7 6.1 6 6.5  (Blank rows = not tested) 11  difference   L-DEX LYMPHEDEMA SCREENING: (no baseline)    QUICK DASH SURVEY: 43%   TODAY'S TREATMENT:                                                                                                                                          DATE: 06/29/23: Assessed sleeve for fit. It fit snug but was purchased from [place] so no way to  ensure correct compression levels. Remeasured circumferences and overall they have increased. Manual lymph drainage in supine as follows: short neck, right axillary nodes, left inguinal nodes, 5 diaphragmatic breaths; anterior inter-axillary anastamoses, left axillo-inguinal anastamoses. Left upper extremity from fingers and dorsal hand to lateral shoulder redirecting along pathways.   06/24/2023: Py removed bandages that she had applied and washed arm at sink Manual lymph drainage in supine as follows: short neck, right axillary nodes, left inguinal nodes, 5 diaphragmatic breaths; anterior inter-axillary anastamoses, left axillo-inguinal anastamoses. Left upper extremity from fingers and dorsal  hand to lateral shoulder redirecting along pathways.then to sidelying for posterior interaxillary anastamosis, Pt also did left UE shoulder ROM and deep breathing.  Compression bandaging as follows: medium TG soft, elastamull to digits 1-5, artiflex from hand to axilla, 1 6 cm bandage at hand, 1 8 cm bandage from wrist to axilla, 1 10 cm bandage from wrist to axilla.  06/23/23: Removed bandages and remeasured circumferences Manual lymph drainage in supine as follows: short neck, right axillary nodes, left inguinal nodes, 5 diaphragmatic breaths; anterior inter-axillary anastamoses, left axillo-inguinal anastamoses. Left upper extremity from fingers and dorsal hand to lateral shoulder redirecting along pathways. Compression bandaging as follows: medium TG soft, elastamull to digits 1-5, artiflex from hand to axilla, 1 6 cm bandage at hand, 1 8 cm bandage from wrist to axilla, 1 10 cm bandage from wrist to axilla.  06/17/23: Removed bandages and assessed pt's independence. She did an excellent job bandaging.  Remeasured circumferences Measured for a compression sleeve and glove: Sigvarus Secure M2 sleeve and small glove and issued info on how to obtain Manual lymph drainage in supine as follows: short neck, right axillary nodes, left inguinal nodes, 5 diaphragmatic breaths; anterior inter-axillary anastamoses, left axillo-inguinal anastamoses. Left upper extremity from fingers and dorsal hand to lateral shoulder redirecting along pathways. Instructed pt throughout on correct sequence, skin stretch and pressure.  Compression bandaging as follows: medium TG soft, elastamull to digits 1-5, artiflex from hand to axilla, 1 6 cm bandage at hand, 1 8 cm bandage from wrist to axilla, 1 10 cm bandage from wrist to axilla.  06/15/23: Instructed pt in self bandaging today and therapist demonstrate each step then had pt return demonstrate as follows: medium TG soft, elastamull to digits 1-5, artiflex from hand to  axilla, 1 6 cm bandage at hand, 1 8 cm bandage from wrist to axilla, 1 10 cm bandage from wrist to axilla. Therapist removed and reapplied banages at the end so pt could feel what pressure the bandages should have.  Educated pt to remove bandages if hand turns blue, there is pain that does not go away when she moves, numbness or tingling that does not go away when she moves  06/11/23  Eval performed Encouraged AROM, elevation, and distal to proximal milking type massage until next visits.   PATIENT EDUCATION:  Education details: self bandaging, MD Dareen Piano how to bandage your own arm Person educated: Patient Education method: Explanation, Demonstration, Tactile cues, and Verbal cues Education comprehension: verbalized understanding, returned demonstration, verbal cues required, and needs further education  HOME EXERCISE PROGRAM: Practice self bandaging Encouraged AROM, elevation, and distal to proximal milking type massage until next visits.   ASSESSMENT:  CLINICAL IMPRESSION: Pt received her compression sleeve from [employer]. Explained to pt that there is no way to ensure proper compression levels with a sleeve that is ordered from [employer]. She has been wearing the sleeve since Saturday and her circumferences have increased. She did  not bring her compression bandages today so was unable to bandage at the end of the session. She plans on ordering the sleeve she was measured for. She will apply compression bandages at home.   OBJECTIVE IMPAIRMENTS: decreased activity tolerance, decreased knowledge of condition, decreased knowledge of use of DME, and increased edema.   ACTIVITY LIMITATIONS: none  PARTICIPATION LIMITATIONS: yard work  PERSONAL FACTORS: SLNB are also affecting patient's functional outcome.   REHAB POTENTIAL: Excellent  CLINICAL DECISION MAKING: Stable/uncomplicated  EVALUATION COMPLEXITY: Low  GOALS: Goals reviewed with patient? Yes  SHORT TERM GOALS = LTGs: Target  date: 07/23/23  Pt will plateau left UE reduction to be measured for UE compression Baseline: Goal status: INITIAL  2.  Pt will be ind with self MLD for the UE Baseline:  Goal status: INITIAL  3.  Pt will be ind with self bandaging and where to get supplies  Baseline:  Goal status: INITIAL  4.  Pt will be ind with stretches for any continued tightness Baseline:  Goal status: INITIAL  PLAN:  PT FREQUENCY: 2x/week (pt may be ok with 3 if needed but wanted to try self bandage)  PT DURATION: 6 weeks  PLANNED INTERVENTIONS: Therapeutic exercises, Therapeutic activity, Patient/Family education, Self Care, Orthotic/Fit training, Prosthetic training, DME instructions, Manual therapy, and re-eval  PLAN FOR NEXT SESSION: check sleeve CDT Lt UE, assess independence with self bandaging, instruct pt in self MLD,   Leonette Most, PT 06/29/2023, 9:03 AM  [facility] [address] [place], [place], [address] Phone: [phone]   Fax:  [phone]

## 2023-07-01 ENCOUNTER — Encounter: Payer: Self-pay | Admitting: Physical Therapy

## 2023-07-01 ENCOUNTER — Ambulatory Visit: Payer: Medicare Other | Admitting: Physical Therapy

## 2023-07-01 DIAGNOSIS — I972 Postmastectomy lymphedema syndrome: Secondary | ICD-10-CM | POA: Diagnosis not present

## 2023-07-01 DIAGNOSIS — M25612 Stiffness of left shoulder, not elsewhere classified: Secondary | ICD-10-CM

## 2023-07-01 DIAGNOSIS — Z9012 Acquired absence of left breast and nipple: Secondary | ICD-10-CM

## 2023-07-01 DIAGNOSIS — C50212 Malignant neoplasm of upper-inner quadrant of left female breast: Secondary | ICD-10-CM

## 2023-07-01 IMAGING — US IR IMAGING GUIDED PORT INSERTION
1 series · 2 of 2 positions shown · non-contrast
Comparison: none

INDICATION: 65-year-old female referred for port catheter

[Series 1: ir fluoro/shunt/fist · 2 of 2 slices shown]
[im 1/2]
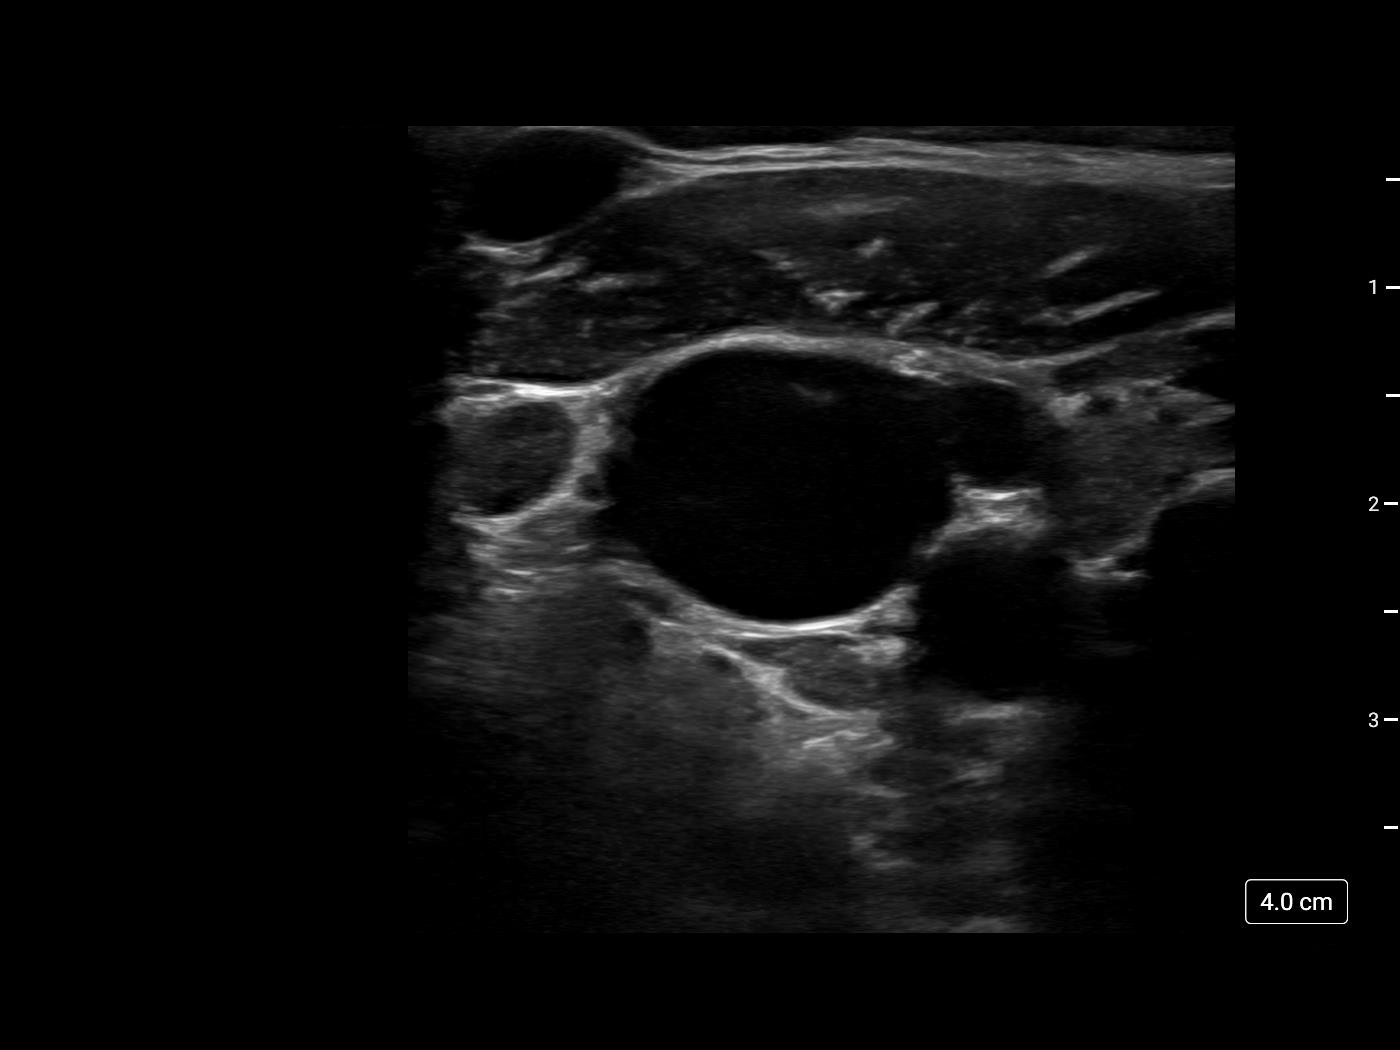
[im 2/2]
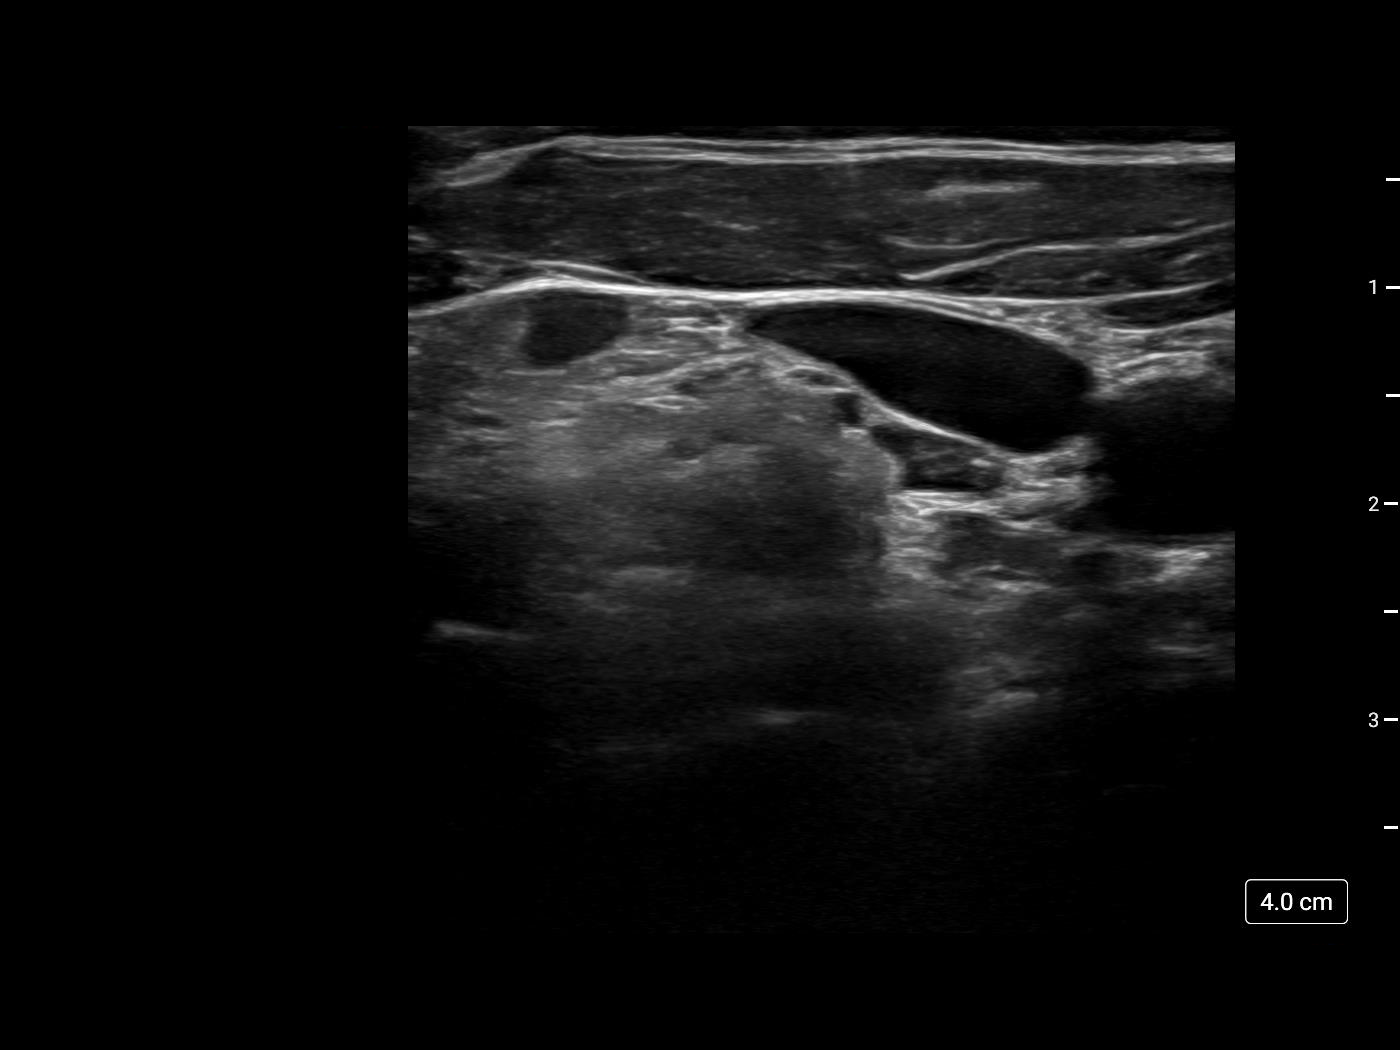

[2 of 2 positions shown; findings below may reference images not displayed]

EXAM:
IMAGE GUIDED PORT CATHETER PLACEMENT

MEDICATIONS:
None

ANESTHESIA/SEDATION:
Moderate (conscious) sedation was employed during this procedure. A
total of Versed 2.0 mg and Fentanyl 100 mcg was administered
intravenously.

Moderate Sedation Time: 23 minutes. The patient's level of
consciousness and vital signs were monitored continuously by
radiology nursing throughout the procedure under my direct
supervision.

FLUOROSCOPY TIME:  Fluoroscopy Time: 0 minutes 6 seconds (6 mGy).

COMPLICATIONS:
None

PROCEDURE:
The procedure, risks, benefits, and alternatives were explained to
the patient. Questions regarding the procedure were encouraged and
answered. The patient understands and consents to the procedure.

Ultrasound survey was performed with images stored and sent to PACs.
Right IJ vein documented to be patent.

The right neck and chest was prepped with chlorhexidine, and draped
in the usual sterile fashion using maximum barrier technique (cap
and mask, sterile gown, sterile gloves, large sterile sheet, hand
hygiene and cutaneous antiseptic). Local anesthesia was attained by
infiltration with 1% lidocaine without epinephrine.

Ultrasound demonstrated patency of the right internal jugular vein,
and this was documented with an image. Under real-time ultrasound
guidance, this vein was accessed with a 21 gauge micropuncture
needle and image documentation was performed. A small dermatotomy
was made at the access site with an 11 scalpel. A 0.018" wire was
advanced into the SVC and used to estimate the length of the
internal catheter. The access needle exchanged for a 4F
micropuncture vascular sheath. The 0.018" wire was then removed and
a 0.035" wire advanced into the IVC.



The venous access site was then serially dilated and a peel away
vascular sheath placed over the wire. The wire was removed and the
port catheter advanced into position under fluoroscopic guidance.
The catheter tip is positioned in the cavoatrial junction. This was
documented with a spot image. The portacatheter was then tested and
found to flush and aspirate well. The port was flushed with saline
followed by 100 units/mL heparinized saline.

The pocket was then closed in two layers using first subdermal
inverted interrupted absorbable sutures followed by a running
subcuticular suture. The epidermis was then sealed with Dermabond.
The dermatotomy at the venous access site was also seal with
Dermabond.

Patient tolerated the procedure well and remained hemodynamically
stable throughout.

No complications encountered and no significant blood loss
encountered
IMPRESSION: Status post right IJ port catheter placement.

## 2023-07-01 NOTE — Therapy (Signed)
OUTPATIENT PHYSICAL THERAPY  UPPER EXTREMITY ONCOLOGY TREATMENT  Patient Name: Madison Becker MRN: [id number] DOB:[birth year], 67 y.o., female Today's Date: 07/01/2023  END OF SESSION:  PT End of Session - 07/01/23 0900     Visit Number 7    Number of Visits 13    Date for PT Re-Evaluation 07/23/23    Authorization - Number of Visits 27    PT Start Time 0805    PT Stop Time 0855    PT Time Calculation (min) 50 min    Activity Tolerance Patient tolerated treatment well    Behavior During Therapy [facility] for tasks assessed/performed                  Past Medical History:  Diagnosis Date   Breast cancer (HCC) 09/2021   GERD (gastroesophageal reflux disease)    History of cervical dysplasia    Port-A-Cath in place 04/09/2022   Past Surgical History:  Procedure Laterality Date   IR IMAGING GUIDED PORT INSERTION  04/09/2022   LEEP  2008   MASTECTOMY W/ SENTINEL NODE BIOPSY Left 03/05/2022   Procedure: LEFT MASTECTOMY WITH SENTINEL LYMPH NODE BIOPSY;  Surgeon: Abigail Miyamoto, MD;  Location: [facility];  Service: General;  Laterality: Left;   PORT-A-CATH REMOVAL Right 10/23/2022   Procedure: REMOVAL PORT-A-CATH;  Surgeon: Abigail Miyamoto, MD;  Location:  [facility];  Service: General;  Laterality: Right;   Patient Active Problem List   Diagnosis Date Noted   Hilar adenopathy 05/08/2022   Multifocal pneumonia 05/07/2022   S/P left mastectomy 03/05/2022   Malignant neoplasm of upper-inner quadrant of left breast in female, estrogen receptor positive (HCC) 10/22/2021    PCP: None  REFERRING PROVIDER: Dr. Pamelia Hoit  REFERRING DIAG: Left arm stiffness , Lt mastectomy   THERAPY DIAG:  Postmastectomy lymphedema  Stiffness of left shoulder, not elsewhere classified  S/P left mastectomy  Malignant neoplasm of upper-inner quadrant of left breast in female, estrogen receptor positive (HCC)  ONSET DATE: 03/05/22  Rationale for Evaluation and  Treatment: Rehabilitation  SUBJECTIVE:                                                                                                                                                                                           SUBJECTIVE STATEMENT: I have not ordered the other sleeve yet but I plan on it.   PERTINENT HISTORY: Left mastectomy 03/05/22 with 1 positive node removed of 2.  Completed chemotherapy on antiestrogens. No radiation  PAIN:  Are you having pain? Yes  NPRS scale: 3/10  Pain location: mosly lateral elbow and then tightness in the  shoulder  Pain orientation: Left  PAIN TYPE: achy Pain description: intermittent  Aggravating factors: sleeping, using the arm a lot  Relieving factors: salon pas patch, rest   PRECAUTIONS: left lymphedema risk   WEIGHT BEARING RESTRICTIONS: No  FALLS:  Has patient fallen in last 6 months? 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     OBJECTIVE:  COGNITION: Overall cognitive status: Within functional limits for tasks assessed   PALPATION: No pitting. PROM mainly full with no significant pain or pulling. Scar moving well.   OBSERVATIONS / OTHER ASSESSMENTS: skin vs edema left lateral chest  POSTURE: rounded shoulders   UPPER EXTREMITY AROM/PROM:  A/PROM RIGHT   eval   Shoulder extension 55  Shoulder flexion 155  Shoulder abduction 160  Shoulder internal rotation   Shoulder external rotation 85    (Blank rows = not tested)  A/PROM LEFT   eval  Shoulder extension 60  Shoulder flexion 160  Shoulder abduction 160  Shoulder internal rotation   Shoulder external rotation 80 - tight in axilla     (Blank rows = not tested)  UPPER EXTREMITY STRENGTH:   LYMPHEDEMA ASSESSMENTS:   LANDMARK  RIGHT  eval  At axilla  35  15 cm proximal to olecranon process 34  10 cm proximal to olecranon process 33  Olecranon process 29.8  15 cm proximal to ulnar styloid process 25.1  10 cm proximal to ulnar styloid process 21.3  Just proximal to ulnar styloid process 17.5  Across hand at thumb web space 20.2  At base of 2nd digit 6.9  (Blank rows = not tested)  LANDMARK LEFT  eval LEFT 06/17/23 LEFT 06/23/23 LEFT 06/29/23  At axilla  37 36.6 37.7 37.2  15 cm proximal to olecranon process 35.5 33.7 33.4 34.3  10 cm proximal to olecranon process 35 33 33 34.4  Olecranon process 29.8 27.8 27.1 27.1  15 cm proximal to ulnar styloid process 25.6 25.3 24.6 25.2  10 cm proximal to ulnar styloid process 21.6 21.7 21.4 20.6  Just proximal to ulnar styloid process 18.5 17.3 17 17.5  Across hand at thumb web space 20.2 18.8 18.2 19.7  At base of 2nd digit 6.7 6.1 6 6.5  (Blank rows = not tested) 11  difference   L-DEX LYMPHEDEMA SCREENING: (no baseline)    QUICK DASH SURVEY: 43%   TODAY'S TREATMENT:                                                                                                                                          DATE: 07/01/23: Removed bandages and assessed pt's independence. She  did an excellent job bandaging.  Manual lymph drainage in supine as follows: short neck, right axillary nodes, left inguinal nodes, 5 diaphragmatic breaths; anterior inter-axillary anastamoses, left axillo-inguinal anastamoses. Left upper extremity from fingers and dorsal hand to lateral shoulder redirecting along pathways. Instructed pt throughout on correct sequence, skin stretch and pressure.  Compression bandaging as follows: medium TG soft, elastamull to digits 1-5, artiflex from hand to axilla, 1 6 cm bandage at hand, 1 8 cm bandage from wrist to axilla, 1 10 cm bandage from wrist to axilla.  06/29/23: Assessed sleeve for fit. It fit snug but was purchased from [employer]  so no way to ensure correct compression levels. Remeasured circumferences and overall they have increased. Manual lymph drainage in supine as follows: short neck, right axillary nodes, left inguinal nodes, 5 diaphragmatic breaths; anterior inter-axillary anastamoses, left axillo-inguinal anastamoses. Left upper extremity from fingers and dorsal hand to lateral shoulder redirecting along pathways.   06/24/2023: Py removed bandages that she had applied and washed arm at sink Manual lymph drainage in supine as follows: short neck, right axillary nodes, left inguinal nodes, 5 diaphragmatic breaths; anterior inter-axillary anastamoses, left axillo-inguinal anastamoses. Left upper extremity from fingers and dorsal hand to lateral shoulder redirecting along pathways.then to sidelying for posterior interaxillary anastamosis, Pt also did left UE shoulder ROM and deep breathing.  Compression bandaging as follows: medium TG soft, elastamull to digits 1-5, artiflex from hand to axilla, 1 6 cm bandage at hand, 1 8 cm bandage from wrist to axilla, 1 10 cm bandage from wrist to axilla.  06/23/23: Removed bandages and remeasured circumferences Manual lymph drainage in supine as follows: short neck, right axillary nodes, left inguinal nodes, 5 diaphragmatic breaths; anterior inter-axillary anastamoses, left axillo-inguinal anastamoses. Left upper extremity from fingers and dorsal hand to lateral shoulder redirecting along pathways. Compression bandaging as follows: medium TG soft, elastamull to digits 1-5, artiflex from hand to axilla, 1 6 cm bandage at hand, 1 8 cm bandage from wrist to axilla, 1 10 cm bandage from wrist to axilla.  06/17/23: Removed bandages and assessed pt's independence. She did an excellent job bandaging.  Remeasured circumferences Measured for a compression sleeve and glove: Sigvarus Secure M2 sleeve and small glove and issued info on how to obtain Manual lymph drainage in supine as follows: short  neck, right axillary nodes, left inguinal nodes, 5 diaphragmatic breaths; anterior inter-axillary anastamoses, left axillo-inguinal anastamoses. Left upper extremity from fingers and dorsal hand to lateral shoulder redirecting along pathways. Instructed pt throughout on correct sequence, skin stretch and pressure.  Compression bandaging as follows: medium TG soft, elastamull to digits 1-5, artiflex from hand to axilla, 1 6 cm bandage at hand, 1 8 cm bandage from wrist to axilla, 1 10 cm bandage from wrist to axilla.  06/15/23: Instructed pt in self bandaging today and therapist demonstrate each step then had pt return demonstrate as follows: medium TG soft, elastamull to digits 1-5, artiflex from hand to axilla, 1 6 cm bandage at hand, 1 8 cm bandage from wrist to axilla, 1 10 cm bandage from wrist to axilla. Therapist removed and reapplied banages at the end so pt could feel what pressure the bandages should have.  Educated pt to remove bandages if hand turns blue, there is pain that does not go away when she moves, numbness or tingling that does not go away when she moves  06/11/23  Eval performed Encouraged AROM, elevation, and distal to proximal milking type massage until next visits.  PATIENT EDUCATION:  Education details: self bandaging, MD Dareen Piano how to bandage your own arm Person educated: Patient Education method: Explanation, Demonstration, Tactile cues, and Verbal cues Education comprehension: verbalized understanding, returned demonstration, verbal cues required, and needs further education  HOME EXERCISE PROGRAM: Practice self bandaging Encouraged AROM, elevation, and distal to proximal milking type massage until next visits.   ASSESSMENT:  CLINICAL IMPRESSION: Sent pt's information to [facility] to check for coverage on garments. Pt plans to order a sleeve and glove out of pocket so she can get it faster as well. Continued with manual lymphatic drainage and compression  bandaging today until pt is able to obtain a sleeve for independent management.   OBJECTIVE IMPAIRMENTS: decreased activity tolerance, decreased knowledge of condition, decreased knowledge of use of DME, and increased edema.   ACTIVITY LIMITATIONS: none  PARTICIPATION LIMITATIONS: yard work  PERSONAL FACTORS: SLNB are also affecting patient's functional outcome.   REHAB POTENTIAL: Excellent  CLINICAL DECISION MAKING: Stable/uncomplicated  EVALUATION COMPLEXITY: Low  GOALS: Goals reviewed with patient? Yes  SHORT TERM GOALS = LTGs: Target date: 07/23/23  Pt will plateau left UE reduction to be measured for UE compression Baseline: Goal status: INITIAL  2.  Pt will be ind with self MLD for the UE Baseline:  Goal status: INITIAL  3.  Pt will be ind with self bandaging and where to get supplies  Baseline:  Goal status: INITIAL  4.  Pt will be ind with stretches for any continued tightness Baseline:  Goal status: INITIAL  PLAN:  PT FREQUENCY: 2x/week (pt may be ok with 3 if needed but wanted to try self bandage)  PT DURATION: 6 weeks  PLANNED INTERVENTIONS: Therapeutic exercises, Therapeutic activity, Patient/Family education, Self Care, Orthotic/Fit training, Prosthetic training, DME instructions, Manual therapy, and re-eval  PLAN FOR NEXT SESSION: check sleeve CDT Lt UE, assess independence with self bandaging, instruct pt in self MLD,   Leonette Most, PT 07/01/2023, 9:02 AM  [facility] [address] [place], [place], [address] Phone: [phone]   Fax:  [phone]

## 2023-07-06 ENCOUNTER — Encounter: Payer: Self-pay | Admitting: Physical Therapy

## 2023-07-06 ENCOUNTER — Ambulatory Visit: Payer: Medicare Other | Admitting: Physical Therapy

## 2023-07-06 DIAGNOSIS — C50212 Malignant neoplasm of upper-inner quadrant of left female breast: Secondary | ICD-10-CM

## 2023-07-06 DIAGNOSIS — I972 Postmastectomy lymphedema syndrome: Secondary | ICD-10-CM | POA: Diagnosis not present

## 2023-07-06 DIAGNOSIS — Z9012 Acquired absence of left breast and nipple: Secondary | ICD-10-CM

## 2023-07-06 DIAGNOSIS — M25612 Stiffness of left shoulder, not elsewhere classified: Secondary | ICD-10-CM

## 2023-07-06 NOTE — Therapy (Signed)
OUTPATIENT PHYSICAL THERAPY  UPPER EXTREMITY ONCOLOGY TREATMENT  Patient Name: Madison Becker MRN: [id number] DOB:26-Sep-1956, 67 y.o., female Today's Date: 07/06/2023  END OF SESSION:  PT End of Session - 07/06/23 0907     Visit Number 8    Number of Visits 13    Date for PT Re-Evaluation 07/23/23    PT Start Time 0906    PT Stop Time 0959    PT Time Calculation (min) 53 min    Activity Tolerance Patient tolerated treatment well    Behavior During Therapy [facility] for tasks assessed/performed                  Past Medical History:  Diagnosis Date   Breast cancer (HCC) 09/2021   GERD (gastroesophageal reflux disease)    History of cervical dysplasia    Port-A-Cath in place 04/09/2022   Past Surgical History:  Procedure Laterality Date   IR IMAGING GUIDED PORT INSERTION  04/09/2022   LEEP  2008   MASTECTOMY W/ SENTINEL NODE BIOPSY Left 03/05/2022   Procedure: LEFT MASTECTOMY WITH SENTINEL LYMPH NODE BIOPSY;  Surgeon: Abigail Miyamoto, MD;  Location: [facility];  Service: General;  Laterality: Left;   PORT-A-CATH REMOVAL Right 10/23/2022   Procedure: REMOVAL PORT-A-CATH;  Surgeon: Abigail Miyamoto, MD;  Location: [facility];  Service: General;  Laterality: Right;   Patient Active Problem List   Diagnosis Date Noted   Hilar adenopathy 05/08/2022   Multifocal pneumonia 05/07/2022   S/P left mastectomy 03/05/2022   Malignant neoplasm of upper-inner quadrant of left breast in female, estrogen receptor positive (HCC) 10/22/2021    PCP: None  REFERRING PROVIDER: Dr. Pamelia Hoit  REFERRING DIAG: Left arm stiffness , Lt mastectomy   THERAPY DIAG:  Postmastectomy lymphedema  Stiffness of left shoulder, not elsewhere classified  S/P left mastectomy  Malignant neoplasm of upper-inner quadrant of left breast in female, estrogen receptor positive (HCC)  ONSET DATE: 03/05/22  Rationale for Evaluation and Treatment: Rehabilitation  SUBJECTIVE:                                                                                                                                                                                            SUBJECTIVE STATEMENT: I feel numbness on my side. I have pain on the back of my elbow that I have been feeling more recently.   PERTINENT HISTORY: Left mastectomy 03/05/22 with 1 positive node removed of 2.  Completed chemotherapy on antiestrogens. No radiation  PAIN:  Are you having pain? Yes  NPRS scale: 3/10  Pain location: mosly lateral elbow and then tightness in the  shoulder  Pain orientation: Left  PAIN TYPE: achy Pain description: intermittent  Aggravating factors: sleeping, using the arm a lot  Relieving factors: salon pas patch, rest   PRECAUTIONS: left lymphedema risk   WEIGHT BEARING RESTRICTIONS: No  FALLS:  Has patient fallen in last 6 months? 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     OBJECTIVE:  COGNITION: Overall cognitive status: Within functional limits for tasks assessed   PALPATION: No pitting. PROM mainly full with no significant pain or pulling. Scar moving well.   OBSERVATIONS / OTHER ASSESSMENTS: skin vs edema left lateral chest  POSTURE: rounded shoulders   UPPER EXTREMITY AROM/PROM:  A/PROM RIGHT   eval   Shoulder extension 55  Shoulder flexion 155  Shoulder abduction 160  Shoulder internal rotation   Shoulder external rotation 85    (Blank rows = not tested)  A/PROM LEFT   eval  Shoulder extension 60  Shoulder flexion 160  Shoulder abduction 160  Shoulder internal rotation   Shoulder external rotation 80 - tight in axilla     (Blank rows = not tested)  UPPER EXTREMITY STRENGTH:   LYMPHEDEMA ASSESSMENTS:    LANDMARK RIGHT  eval  At axilla  35  15 cm proximal to olecranon process 34  10 cm proximal to olecranon process 33  Olecranon process 29.8  15 cm proximal to ulnar styloid process 25.1  10 cm proximal to ulnar styloid process 21.3  Just proximal to ulnar styloid process 17.5  Across hand at thumb web space 20.2  At base of 2nd digit 6.9  (Blank rows = not tested)  LANDMARK LEFT  eval LEFT 06/17/23 LEFT 06/23/23 LEFT 06/29/23  At axilla  37 36.6 37.7 37.2  15 cm proximal to olecranon process 35.5 33.7 33.4 34.3  10 cm proximal to olecranon process 35 33 33 34.4  Olecranon process 29.8 27.8 27.1 27.1  15 cm proximal to ulnar styloid process 25.6 25.3 24.6 25.2  10 cm proximal to ulnar styloid process 21.6 21.7 21.4 20.6  Just proximal to ulnar styloid process 18.5 17.3 17 17.5  Across hand at thumb web space 20.2 18.8 18.2 19.7  At base of 2nd digit 6.7 6.1 6 6.5  (Blank rows = not tested) 11  difference   L-DEX LYMPHEDEMA SCREENING: (no baseline)    QUICK DASH SURVEY: 43%   TODAY'S TREATMENT:                                                                                                                                          DATE: 07/06/23: Removed bandages and assessed pt's independence.  Manual lymph drainage in supine as follows: short neck, right axillary nodes, left inguinal nodes, 5 diaphragmatic breaths; anterior inter-axillary anastamoses, left axillo-inguinal anastamoses. Left upper extremity from fingers and dorsal hand to lateral shoulder redirecting along pathways.  Compression bandaging as follows: medium TG soft, elastamull to digits 1-5, 1/2 grey foam to olecranon in area of discomfort, artiflex from hand to axilla, 1 6 cm bandage at hand, 1 8 cm bandage from wrist to axilla, 1 10 cm bandage from wrist to axilla.  07/01/23: Removed bandages and assessed pt's independence. She did an excellent job bandaging.  Manual lymph drainage in  supine as follows: short neck, right axillary nodes, left inguinal nodes, 5 diaphragmatic breaths; anterior inter-axillary anastamoses, left axillo-inguinal anastamoses. Left upper extremity from fingers and dorsal hand to lateral shoulder redirecting along pathways. Instructed pt throughout on correct sequence, skin stretch and pressure.  Compression bandaging as follows: medium TG soft, elastamull to digits 1-5, artiflex from hand to axilla, 1 6 cm bandage at hand, 1 8 cm bandage from wrist to axilla, 1 10 cm bandage from wrist to axilla.  06/29/23: Assessed sleeve for fit. It fit snug but was purchased from [place] so no way to ensure correct compression levels. Remeasured circumferences and overall they have increased. Manual lymph drainage in supine as follows: short neck, right axillary nodes, left inguinal nodes, 5 diaphragmatic breaths; anterior inter-axillary anastamoses, left axillo-inguinal anastamoses. Left upper extremity from fingers and dorsal hand to lateral shoulder redirecting along pathways.   06/24/2023: Py removed bandages that she had applied and washed arm at sink Manual lymph drainage in supine as follows: short neck, right axillary nodes, left inguinal nodes, 5 diaphragmatic breaths; anterior inter-axillary anastamoses, left axillo-inguinal anastamoses. Left upper extremity from fingers and dorsal hand to lateral shoulder redirecting along pathways.then to sidelying for posterior interaxillary anastamosis, Pt also did left UE shoulder ROM and deep breathing.  Compression bandaging as follows: medium TG soft, elastamull to digits 1-5, artiflex from hand to axilla, 1 6 cm bandage at hand, 1 8 cm bandage from wrist to axilla, 1 10 cm bandage from wrist to axilla.  06/23/23: Removed bandages and remeasured circumferences Manual lymph drainage in supine as follows: short neck, right axillary nodes, left inguinal nodes, 5 diaphragmatic breaths; anterior inter-axillary anastamoses, left  axillo-inguinal anastamoses. Left upper extremity from fingers and dorsal hand to lateral shoulder redirecting along pathways. Compression bandaging as follows: medium TG soft, elastamull to digits 1-5, artiflex from hand to axilla, 1 6 cm bandage at hand, 1 8 cm bandage from wrist to axilla, 1 10 cm bandage from wrist to axilla.  06/17/23: Removed bandages and assessed pt's independence. She did an excellent job bandaging.  Remeasured circumferences Measured for a compression sleeve and glove: Sigvarus Secure M2 sleeve and small glove and issued info on how to obtain Manual lymph drainage in supine as follows: short neck, right axillary nodes, left inguinal nodes, 5 diaphragmatic breaths; anterior inter-axillary anastamoses, left axillo-inguinal anastamoses. Left upper extremity from fingers and dorsal hand to lateral shoulder redirecting along pathways. Instructed pt throughout on correct sequence, skin stretch and pressure.  Compression bandaging as follows: medium TG soft, elastamull to digits 1-5, artiflex from hand to axilla, 1 6 cm bandage at hand, 1 8 cm bandage from wrist to axilla, 1 10 cm bandage from wrist to axilla.  06/15/23: Instructed pt in self bandaging today and therapist demonstrate each step then had pt return demonstrate as follows: medium TG soft, elastamull to digits 1-5,  artiflex from hand to axilla, 1 6 cm bandage at hand, 1 8 cm bandage from wrist to axilla, 1 10 cm bandage from wrist to axilla. Therapist removed and reapplied banages at the end so pt could feel what pressure the bandages should have.  Educated pt to remove bandages if hand turns blue, there is pain that does not go away when she moves, numbness or tingling that does not go away when she moves  06/11/23  Eval performed Encouraged AROM, elevation, and distal to proximal milking type massage until next visits.   PATIENT EDUCATION:  Education details: self bandaging, MD Dareen Piano how to bandage your own  arm Person educated: Patient Education method: Explanation, Demonstration, Tactile cues, and Verbal cues Education comprehension: verbalized understanding, returned demonstration, verbal cues required, and needs further education  HOME EXERCISE PROGRAM: Practice self bandaging Encouraged AROM, elevation, and distal to proximal milking type massage until next visits.   ASSESSMENT:  CLINICAL IMPRESSION: Pt reports she has had discomfort at her olecranon while wearing the bandages so added 1/2 grey foam today to this area to help decrease discomfort. Continued with MLD and compression bandaging. Pt plans to order her sleeve soon.   OBJECTIVE IMPAIRMENTS: decreased activity tolerance, decreased knowledge of condition, decreased knowledge of use of DME, and increased edema.   ACTIVITY LIMITATIONS: none  PARTICIPATION LIMITATIONS: yard work  PERSONAL FACTORS: SLNB are also affecting patient's functional outcome.   REHAB POTENTIAL: Excellent  CLINICAL DECISION MAKING: Stable/uncomplicated  EVALUATION COMPLEXITY: Low  GOALS: Goals reviewed with patient? Yes  SHORT TERM GOALS = LTGs: Target date: 07/23/23  Pt will plateau left UE reduction to be measured for UE compression Baseline: Goal status: INITIAL  2.  Pt will be ind with self MLD for the UE Baseline:  Goal status: INITIAL  3.  Pt will be ind with self bandaging and where to get supplies  Baseline:  Goal status: INITIAL  4.  Pt will be ind with stretches for any continued tightness Baseline:  Goal status: INITIAL  PLAN:  PT FREQUENCY: 2x/week (pt may be ok with 3 if needed but wanted to try self bandage)  PT DURATION: 6 weeks  PLANNED INTERVENTIONS: Therapeutic exercises, Therapeutic activity, Patient/Family education, Self Care, Orthotic/Fit training, Prosthetic training, DME instructions, Manual therapy, and re-eval  PLAN FOR NEXT SESSION: check sleeve CDT Lt UE, assess independence with self bandaging, instruct  pt in self MLD,   Leonette Most, PT 07/06/2023, 10:03 AM  [facility] [address] [place], [place], [address] Phone: [phone]   Fax:  [phone]

## 2023-07-08 ENCOUNTER — Encounter: Payer: Self-pay | Admitting: Physical Therapy

## 2023-07-08 ENCOUNTER — Ambulatory Visit: Payer: Medicare Other | Admitting: Physical Therapy

## 2023-07-08 DIAGNOSIS — I972 Postmastectomy lymphedema syndrome: Secondary | ICD-10-CM

## 2023-07-08 DIAGNOSIS — M25612 Stiffness of left shoulder, not elsewhere classified: Secondary | ICD-10-CM

## 2023-07-08 DIAGNOSIS — Z170 Estrogen receptor positive status [ER+]: Secondary | ICD-10-CM

## 2023-07-08 DIAGNOSIS — Z9012 Acquired absence of left breast and nipple: Secondary | ICD-10-CM

## 2023-07-08 NOTE — Therapy (Signed)
OUTPATIENT PHYSICAL THERAPY  UPPER EXTREMITY ONCOLOGY TREATMENT  Patient Name: Madison Becker MRN: [id number] DOB:02/20/1956, 67 y.o., female Today's Date: 07/08/2023  END OF SESSION:  PT End of Session - 07/08/23 0859     Visit Number 9    Number of Visits 13    Date for PT Re-Evaluation 07/23/23    PT Start Time 0803    PT Stop Time 0859    PT Time Calculation (min) 56 min    Activity Tolerance Patient tolerated treatment well    Behavior During Therapy [facility] for tasks assessed/performed                   Past Medical History:  Diagnosis Date   Breast cancer (HCC) 09/2021   GERD (gastroesophageal reflux disease)    History of cervical dysplasia    Port-A-Cath in place 04/09/2022   Past Surgical History:  Procedure Laterality Date   IR IMAGING GUIDED PORT INSERTION  04/09/2022   LEEP  2008   MASTECTOMY W/ SENTINEL NODE BIOPSY Left 03/05/2022   Procedure: LEFT MASTECTOMY WITH SENTINEL LYMPH NODE BIOPSY;  Surgeon: Abigail Miyamoto, MD;  Location: [facility];  Service: General;  Laterality: Left;   PORT-A-CATH REMOVAL Right 10/23/2022   Procedure: REMOVAL PORT-A-CATH;  Surgeon: Abigail Miyamoto, MD;  Location: [facility];  Service: General;  Laterality: Right;   Patient Active Problem List   Diagnosis Date Noted   Hilar adenopathy 05/08/2022   Multifocal pneumonia 05/07/2022   S/P left mastectomy 03/05/2022   Malignant neoplasm of upper-inner quadrant of left breast in female, estrogen receptor positive (HCC) 10/22/2021    PCP: None  REFERRING PROVIDER: Dr. Pamelia Hoit  REFERRING DIAG: Left arm stiffness , Lt mastectomy   THERAPY DIAG:  Postmastectomy lymphedema  Stiffness of left shoulder, not elsewhere classified  S/P left mastectomy  Malignant neoplasm of upper-inner quadrant of left breast in female, estrogen receptor positive (HCC)  ONSET DATE: 03/05/22  Rationale for Evaluation and Treatment:  Rehabilitation  SUBJECTIVE:                                                                                                                                                                                           SUBJECTIVE STATEMENT: I still feel achiness in my elbow.   PERTINENT HISTORY: Left mastectomy 03/05/22 with 1 positive node removed of 2.  Completed chemotherapy on antiestrogens. No radiation  PAIN:  Are you having pain? Yes  NPRS scale: 3/10  Pain location: mosly lateral elbow and then tightness in the  shoulder  Pain orientation: Left  PAIN TYPE: achy Pain description: intermittent  Aggravating  factors: sleeping, using the arm a lot  Relieving factors: salon pas patch, rest   PRECAUTIONS: left lymphedema risk   WEIGHT BEARING RESTRICTIONS: No  FALLS:  Has patient fallen in last 6 months? 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     OBJECTIVE:  COGNITION: Overall cognitive status: Within functional limits for tasks assessed   PALPATION: No pitting. PROM mainly full with no significant pain or pulling. Scar moving well.   OBSERVATIONS / OTHER ASSESSMENTS: skin vs edema left lateral chest  POSTURE: rounded shoulders   UPPER EXTREMITY AROM/PROM:  A/PROM RIGHT   eval   Shoulder extension 55  Shoulder flexion 155  Shoulder abduction 160  Shoulder internal rotation   Shoulder external rotation 85    (Blank rows = not tested)  A/PROM LEFT   eval  Shoulder extension 60  Shoulder flexion 160  Shoulder abduction 160  Shoulder internal rotation   Shoulder external rotation 80 - tight in axilla     (Blank rows = not tested)  UPPER EXTREMITY STRENGTH:   LYMPHEDEMA ASSESSMENTS:   LANDMARK RIGHT  eval  At axilla  35  15  cm proximal to olecranon process 34  10 cm proximal to olecranon process 33  Olecranon process 29.8  15 cm proximal to ulnar styloid process 25.1  10 cm proximal to ulnar styloid process 21.3  Just proximal to ulnar styloid process 17.5  Across hand at thumb web space 20.2  At base of 2nd digit 6.9  (Blank rows = not tested)  LANDMARK LEFT  eval LEFT 06/17/23 LEFT 06/23/23 LEFT 06/29/23  At axilla  37 36.6 37.7 37.2  15 cm proximal to olecranon process 35.5 33.7 33.4 34.3  10 cm proximal to olecranon process 35 33 33 34.4  Olecranon process 29.8 27.8 27.1 27.1  15 cm proximal to ulnar styloid process 25.6 25.3 24.6 25.2  10 cm proximal to ulnar styloid process 21.6 21.7 21.4 20.6  Just proximal to ulnar styloid process 18.5 17.3 17 17.5  Across hand at thumb web space 20.2 18.8 18.2 19.7  At base of 2nd digit 6.7 6.1 6 6.5  (Blank rows = not tested) 11  difference   L-DEX LYMPHEDEMA SCREENING: (no baseline)    QUICK DASH SURVEY: 43%   TODAY'S TREATMENT:                                                                                                                                          DATE: 07/08/23: Removed bandages  Manual lymph drainage in supine as follows: short neck, right axillary nodes, left inguinal nodes, 5 diaphragmatic breaths;  anterior inter-axillary anastamoses, left axillo-inguinal anastamoses. Left upper extremity from fingers and dorsal hand to lateral shoulder redirecting along pathways.  Compression bandaging as follows: lotion, medium TG soft, elastamull to digits 1-5, 1/2 grey foam to olecranon in area of discomfort, artiflex from hand to axilla, 1 6 cm bandage at hand, 1 8 cm bandage from wrist to axilla, 1 10 cm bandage from wrist to axilla.  07/06/23: Removed bandages and assessed pt's independence.  Manual lymph drainage in supine as follows: short neck, right axillary nodes, left inguinal nodes, 5 diaphragmatic breaths; anterior  inter-axillary anastamoses, left axillo-inguinal anastamoses. Left upper extremity from fingers and dorsal hand to lateral shoulder redirecting along pathways.  Compression bandaging as follows: medium TG soft, elastamull to digits 1-5, 1/2 grey foam to olecranon in area of discomfort, artiflex from hand to axilla, 1 6 cm bandage at hand, 1 8 cm bandage from wrist to axilla, 1 10 cm bandage from wrist to axilla.  07/01/23: Removed bandages and assessed pt's independence. She did an excellent job bandaging.  Manual lymph drainage in supine as follows: short neck, right axillary nodes, left inguinal nodes, 5 diaphragmatic breaths; anterior inter-axillary anastamoses, left axillo-inguinal anastamoses. Left upper extremity from fingers and dorsal hand to lateral shoulder redirecting along pathways. Instructed pt throughout on correct sequence, skin stretch and pressure.  Compression bandaging as follows: medium TG soft, elastamull to digits 1-5, artiflex from hand to axilla, 1 6 cm bandage at hand, 1 8 cm bandage from wrist to axilla, 1 10 cm bandage from wrist to axilla.  06/29/23: Assessed sleeve for fit. It fit snug but was purchased from [place] so no way to ensure correct compression levels. Remeasured circumferences and overall they have increased. Manual lymph drainage in supine as follows: short neck, right axillary nodes, left inguinal nodes, 5 diaphragmatic breaths; anterior inter-axillary anastamoses, left axillo-inguinal anastamoses. Left upper extremity from fingers and dorsal hand to lateral shoulder redirecting along pathways.   06/24/2023: Py removed bandages that she had applied and washed arm at sink Manual lymph drainage in supine as follows: short neck, right axillary nodes, left inguinal nodes, 5 diaphragmatic breaths; anterior inter-axillary anastamoses, left axillo-inguinal anastamoses. Left upper extremity from fingers and dorsal hand to lateral shoulder redirecting along pathways.then to  sidelying for posterior interaxillary anastamosis, Pt also did left UE shoulder ROM and deep breathing.  Compression bandaging as follows: medium TG soft, elastamull to digits 1-5, artiflex from hand to axilla, 1 6 cm bandage at hand, 1 8 cm bandage from wrist to axilla, 1 10 cm bandage from wrist to axilla.  06/23/23: Removed bandages and remeasured circumferences Manual lymph drainage in supine as follows: short neck, right axillary nodes, left inguinal nodes, 5 diaphragmatic breaths; anterior inter-axillary anastamoses, left axillo-inguinal anastamoses. Left upper extremity from fingers and dorsal hand to lateral shoulder redirecting along pathways. Compression bandaging as follows: medium TG soft, elastamull to digits 1-5, artiflex from hand to axilla, 1 6 cm bandage at hand, 1 8 cm bandage from wrist to axilla, 1 10 cm bandage from wrist to axilla.  06/17/23: Removed bandages and assessed pt's independence. She did an excellent job bandaging.  Remeasured circumferences Measured for a compression sleeve and glove: Sigvarus Secure M2 sleeve and small glove and issued info on how to obtain Manual lymph drainage in supine as follows: short neck, right axillary nodes, left inguinal nodes, 5 diaphragmatic breaths; anterior inter-axillary anastamoses, left axillo-inguinal anastamoses. Left upper extremity from fingers and dorsal hand to lateral shoulder redirecting  along pathways. Instructed pt throughout on correct sequence, skin stretch and pressure.  Compression bandaging as follows: medium TG soft, elastamull to digits 1-5, artiflex from hand to axilla, 1 6 cm bandage at hand, 1 8 cm bandage from wrist to axilla, 1 10 cm bandage from wrist to axilla.  06/15/23: Instructed pt in self bandaging today and therapist demonstrate each step then had pt return demonstrate as follows: medium TG soft, elastamull to digits 1-5, artiflex from hand to axilla, 1 6 cm bandage at hand, 1 8 cm bandage from wrist to  axilla, 1 10 cm bandage from wrist to axilla. Therapist removed and reapplied banages at the end so pt could feel what pressure the bandages should have.  Educated pt to remove bandages if hand turns blue, there is pain that does not go away when she moves, numbness or tingling that does not go away when she moves  06/11/23  Eval performed Encouraged AROM, elevation, and distal to proximal milking type massage until next visits.   PATIENT EDUCATION:  Education details: self bandaging, MD Dareen Piano how to bandage your own arm Person educated: Patient Education method: Explanation, Demonstration, Tactile cues, and Verbal cues Education comprehension: verbalized understanding, returned demonstration, verbal cues required, and needs further education  HOME EXERCISE PROGRAM: Practice self bandaging Encouraged AROM, elevation, and distal to proximal milking type massage until next visits.   ASSESSMENT:  CLINICAL IMPRESSION: Continued with MLD and compression bandaging. She reports she still has some discomfort at olecranon that has been a little better with the grey foam to reduce pressure when she rests her weight on her elbow.   OBJECTIVE IMPAIRMENTS: decreased activity tolerance, decreased knowledge of condition, decreased knowledge of use of DME, and increased edema.   ACTIVITY LIMITATIONS: none  PARTICIPATION LIMITATIONS: yard work  PERSONAL FACTORS: SLNB are also affecting patient's functional outcome.   REHAB POTENTIAL: Excellent  CLINICAL DECISION MAKING: Stable/uncomplicated  EVALUATION COMPLEXITY: Low  GOALS: Goals reviewed with patient? Yes  SHORT TERM GOALS = LTGs: Target date: 07/23/23  Pt will plateau left UE reduction to be measured for UE compression Baseline: Goal status: INITIAL  2.  Pt will be ind with self MLD for the UE Baseline:  Goal status: INITIAL  3.  Pt will be ind with self bandaging and where to get supplies  Baseline:  Goal status: INITIAL  4.   Pt will be ind with stretches for any continued tightness Baseline:  Goal status: INITIAL  PLAN:  PT FREQUENCY: 2x/week (pt may be ok with 3 if needed but wanted to try self bandage)  PT DURATION: 6 weeks  PLANNED INTERVENTIONS: Therapeutic exercises, Therapeutic activity, Patient/Family education, Self Care, Orthotic/Fit training, Prosthetic training, DME instructions, Manual therapy, and re-eval  PLAN FOR NEXT SESSION: check sleeve CDT Lt UE, assess independence with self bandaging, instruct pt in self MLD,   Leonette Most, PT 07/08/2023, 9:03 AM  [facility] [address] [place], [place], [address] Phone: [phone]   Fax:  [phone]

## 2023-07-14 ENCOUNTER — Encounter: Payer: Self-pay | Admitting: Rehabilitation

## 2023-07-14 ENCOUNTER — Ambulatory Visit: Payer: Medicare Other | Admitting: Rehabilitation

## 2023-07-14 DIAGNOSIS — I972 Postmastectomy lymphedema syndrome: Secondary | ICD-10-CM

## 2023-07-14 DIAGNOSIS — M25612 Stiffness of left shoulder, not elsewhere classified: Secondary | ICD-10-CM

## 2023-07-14 DIAGNOSIS — Z170 Estrogen receptor positive status [ER+]: Secondary | ICD-10-CM

## 2023-07-14 DIAGNOSIS — Z9012 Acquired absence of left breast and nipple: Secondary | ICD-10-CM

## 2023-07-14 NOTE — Therapy (Signed)
OUTPATIENT PHYSICAL THERAPY  UPPER EXTREMITY ONCOLOGY TREATMENT  Patient Name: Madison Becker MRN: [id number] DOB:13-Jun-1956, 67 y.o., female Today's Date: 07/14/2023  END OF SESSION:  PT End of Session - 07/14/23 0804     Visit Number 10    Number of Visits 13    Date for PT Re-Evaluation 07/23/23    PT Start Time 0805    PT Stop Time 0853    PT Time Calculation (min) 48 min    Activity Tolerance Patient tolerated treatment well    Behavior During Therapy [facility] for tasks assessed/performed                   Past Medical History:  Diagnosis Date   Breast cancer (HCC) 09/2021   GERD (gastroesophageal reflux disease)    History of cervical dysplasia    Port-A-Cath in place 04/09/2022   Past Surgical History:  Procedure Laterality Date   IR IMAGING GUIDED PORT INSERTION  04/09/2022   LEEP  2008   MASTECTOMY W/ SENTINEL NODE BIOPSY Left 03/05/2022   Procedure: LEFT MASTECTOMY WITH SENTINEL LYMPH NODE BIOPSY;  Surgeon: Abigail Miyamoto, MD;  Location: [facility];  Service: General;  Laterality: Left;   PORT-A-CATH REMOVAL Right 10/23/2022   Procedure: REMOVAL PORT-A-CATH;  Surgeon: Abigail Miyamoto, MD;  Location: [facility];  Service: General;  Laterality: Right;   Patient Active Problem List   Diagnosis Date Noted   Hilar adenopathy 05/08/2022   Multifocal pneumonia 05/07/2022   S/P left mastectomy 03/05/2022   Malignant neoplasm of upper-inner quadrant of left breast in female, estrogen receptor positive (HCC) 10/22/2021    PCP: None  REFERRING PROVIDER: Dr. Pamelia Hoit  REFERRING DIAG: Left arm stiffness , Lt mastectomy   THERAPY DIAG:  Postmastectomy lymphedema  Stiffness of left shoulder, not elsewhere classified  S/P left mastectomy  Malignant neoplasm of upper-inner quadrant of left breast in female, estrogen receptor positive (HCC)  ONSET DATE: 03/05/22  Rationale for Evaluation and Treatment:  Rehabilitation  SUBJECTIVE:                                                                                                                                                                                           SUBJECTIVE STATEMENT: It is not as sore and painful as it was when I first came in.    PERTINENT HISTORY: Left mastectomy 03/05/22 with 1 positive node removed of 2.  Completed chemotherapy on antiestrogens. No radiation  PAIN:  Are you having pain? Yes  NPRS scale: 3/10  Pain location: mosly lateral elbow and then tightness in the  shoulder  Pain orientation: Left  PAIN TYPE: achy Pain description: intermittent  Aggravating factors: sleeping, using the arm a lot  Relieving factors: salon pas patch, rest   PRECAUTIONS: left lymphedema risk   WEIGHT BEARING RESTRICTIONS: No  FALLS:  Has patient fallen in last 6 months? 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     OBJECTIVE:  COGNITION: Overall cognitive status: Within functional limits for tasks assessed   PALPATION: No pitting. PROM mainly full with no significant pain or pulling. Scar moving well.   OBSERVATIONS / OTHER ASSESSMENTS: skin vs edema left lateral chest  POSTURE: rounded shoulders   UPPER EXTREMITY AROM/PROM:  A/PROM RIGHT   eval   Shoulder extension 55  Shoulder flexion 155  Shoulder abduction 160  Shoulder internal rotation   Shoulder external rotation 85    (Blank rows = not tested)  A/PROM LEFT   eval  Shoulder extension 60  Shoulder flexion 160  Shoulder abduction 160  Shoulder internal rotation   Shoulder external rotation 80 - tight in axilla     (Blank rows = not tested)  UPPER EXTREMITY STRENGTH:   LYMPHEDEMA ASSESSMENTS:   LANDMARK RIGHT   eval  At axilla  35  15 cm proximal to olecranon process 34  10 cm proximal to olecranon process 33  Olecranon process 29.8  15 cm proximal to ulnar styloid process 25.1  10 cm proximal to ulnar styloid process 21.3  Just proximal to ulnar styloid process 17.5  Across hand at thumb web space 20.2  At base of 2nd digit 6.9  (Blank rows = not tested)  LANDMARK LEFT  eval LEFT 06/17/23 LEFT 06/23/23 LEFT 06/29/23  At axilla  37 36.6 37.7 37.2  15 cm proximal to olecranon process 35.5 33.7 33.4 34.3  10 cm proximal to olecranon process 35 33 33 34.4  Olecranon process 29.8 27.8 27.1 27.1  15 cm proximal to ulnar styloid process 25.6 25.3 24.6 25.2  10 cm proximal to ulnar styloid process 21.6 21.7 21.4 20.6  Just proximal to ulnar styloid process 18.5 17.3 17 17.5  Across hand at thumb web space 20.2 18.8 18.2 19.7  At base of 2nd digit 6.7 6.1 6 6.5  (Blank rows = not tested) 11  difference   L-DEX LYMPHEDEMA SCREENING: (no baseline)    QUICK DASH SURVEY: 43%   TODAY'S TREATMENT:                                                                                                                                          DATE: 07/14/23: Gave pt MLD handout Manual lymph drainage in supine as follows: short neck,  right axillary nodes, left inguinal nodes, 5 diaphragmatic breaths; anterior inter-axillary anastamoses, left axillo-inguinal anastamoses. Left upper extremity from fingers and dorsal hand to lateral shoulder redirecting along pathways.  Compression bandaging as follows: lotion, medium TG soft, elastamull to digits 1-5, 1/2 grey foam to olecranon in area of discomfort, artiflex from hand to axilla, 1 6 cm bandage at hand, 1 8 cm bandage from wrist to axilla, 1 10 cm bandage from wrist to axilla.  07/08/23: Removed bandages  Manual lymph drainage in supine as follows: short neck, right axillary nodes, left inguinal nodes, 5 diaphragmatic breaths; anterior  inter-axillary anastamoses, left axillo-inguinal anastamoses. Left upper extremity from fingers and dorsal hand to lateral shoulder redirecting along pathways.  Compression bandaging as follows: lotion, medium TG soft, elastamull to digits 1-5, 1/2 grey foam to olecranon in area of discomfort, artiflex from hand to axilla, 1 6 cm bandage at hand, 1 8 cm bandage from wrist to axilla, 1 10 cm bandage from wrist to axilla.  07/06/23: Removed bandages and assessed pt's independence.  Manual lymph drainage in supine as follows: short neck, right axillary nodes, left inguinal nodes, 5 diaphragmatic breaths; anterior inter-axillary anastamoses, left axillo-inguinal anastamoses. Left upper extremity from fingers and dorsal hand to lateral shoulder redirecting along pathways.  Compression bandaging as follows: medium TG soft, elastamull to digits 1-5, 1/2 grey foam to olecranon in area of discomfort, artiflex from hand to axilla, 1 6 cm bandage at hand, 1 8 cm bandage from wrist to axilla, 1 10 cm bandage from wrist to axilla.  07/01/23: Removed bandages and assessed pt's independence. She did an excellent job bandaging.  Manual lymph drainage in supine as follows: short neck, right axillary nodes, left inguinal nodes, 5 diaphragmatic breaths; anterior inter-axillary anastamoses, left axillo-inguinal anastamoses. Left upper extremity from fingers and dorsal hand to lateral shoulder redirecting along pathways. Instructed pt throughout on correct sequence, skin stretch and pressure.  Compression bandaging as follows: medium TG soft, elastamull to digits 1-5, artiflex from hand to axilla, 1 6 cm bandage at hand, 1 8 cm bandage from wrist to axilla, 1 10 cm bandage from wrist to axilla.  06/29/23: Assessed sleeve for fit. It fit snug but was purchased from [employer] so no way to ensure correct compression levels. Remeasured circumferences and overall they have increased. Manual lymph drainage in supine as follows: short  neck, right axillary nodes, left inguinal nodes, 5 diaphragmatic breaths; anterior inter-axillary anastamoses, left axillo-inguinal anastamoses. Left upper extremity from fingers and dorsal hand to lateral shoulder redirecting along pathways.   06/24/2023: Py removed bandages that she had applied and washed arm at sink Manual lymph drainage in supine as follows: short neck, right axillary nodes, left inguinal nodes, 5 diaphragmatic breaths; anterior inter-axillary anastamoses, left axillo-inguinal anastamoses. Left upper extremity from fingers and dorsal hand to lateral shoulder redirecting along pathways.then to sidelying for posterior interaxillary anastamosis, Pt also did left UE shoulder ROM and deep breathing.  Compression bandaging as follows: medium TG soft, elastamull to digits 1-5, artiflex from hand to axilla, 1 6 cm bandage at hand, 1 8 cm bandage from wrist to axilla, 1 10 cm bandage from wrist to axilla.  06/23/23: Removed bandages and remeasured circumferences Manual lymph drainage in supine as follows: short neck, right axillary nodes, left inguinal nodes, 5 diaphragmatic breaths; anterior inter-axillary anastamoses, left axillo-inguinal anastamoses. Left upper extremity from fingers and dorsal hand to lateral shoulder redirecting along pathways. Compression bandaging as follows: medium TG soft, elastamull to digits 1-5,  artiflex from hand to axilla, 1 6 cm bandage at hand, 1 8 cm bandage from wrist to axilla, 1 10 cm bandage from wrist to axilla.  06/17/23: Removed bandages and assessed pt's independence. She did an excellent job bandaging.  Remeasured circumferences Measured for a compression sleeve and glove: Sigvarus Secure M2 sleeve and small glove and issued info on how to obtain Manual lymph drainage in supine as follows: short neck, right axillary nodes, left inguinal nodes, 5 diaphragmatic breaths; anterior inter-axillary anastamoses, left axillo-inguinal anastamoses. Left upper  extremity from fingers and dorsal hand to lateral shoulder redirecting along pathways. Instructed pt throughout on correct sequence, skin stretch and pressure.  Compression bandaging as follows: medium TG soft, elastamull to digits 1-5, artiflex from hand to axilla, 1 6 cm bandage at hand, 1 8 cm bandage from wrist to axilla, 1 10 cm bandage from wrist to axilla.  06/15/23: Instructed pt in self bandaging today and therapist demonstrate each step then had pt return demonstrate as follows: medium TG soft, elastamull to digits 1-5, artiflex from hand to axilla, 1 6 cm bandage at hand, 1 8 cm bandage from wrist to axilla, 1 10 cm bandage from wrist to axilla. Therapist removed and reapplied banages at the end so pt could feel what pressure the bandages should have.  Educated pt to remove bandages if hand turns blue, there is pain that does not go away when she moves, numbness or tingling that does not go away when she moves  06/11/23  Eval performed Encouraged AROM, elevation, and distal to proximal milking type massage until next visits.   PATIENT EDUCATION:  Education details: self bandaging, MD Dareen Piano how to bandage your own arm Person educated: Patient Education method: Explanation, Demonstration, Tactile cues, and Verbal cues Education comprehension: verbalized understanding, returned demonstration, verbal cues required, and needs further education  HOME EXERCISE PROGRAM: Practice self bandaging Encouraged AROM, elevation, and distal to proximal milking type massage until next visits.   ASSESSMENT:  CLINICAL IMPRESSION: Continued with MLD and compression bandaging. Waiting sleeve at this point.  Told her she could be ready for DC when she feels good with bandaging and MLD which she is doing.     OBJECTIVE IMPAIRMENTS: decreased activity tolerance, decreased knowledge of condition, decreased knowledge of use of DME, and increased edema.   ACTIVITY LIMITATIONS: none  PARTICIPATION  LIMITATIONS: yard work  PERSONAL FACTORS: SLNB are also affecting patient's functional outcome.   REHAB POTENTIAL: Excellent  CLINICAL DECISION MAKING: Stable/uncomplicated  EVALUATION COMPLEXITY: Low  GOALS: Goals reviewed with patient? Yes  SHORT TERM GOALS = LTGs: Target date: 07/23/23  Pt will plateau left UE reduction to be measured for UE compression Baseline: Goal status: MET  2.  Pt will be ind with self MLD for the UE Baseline:  Goal status: MET  3.  Pt will be ind with self bandaging and where to get supplies  Baseline:  Goal status: MET  4.  Pt will be ind with stretches for any continued tightness Baseline:  Goal status: INITIAL  PLAN:  PT FREQUENCY: 2x/week (pt may be ok with 3 if needed but wanted to try self bandage)  PT DURATION: 6 weeks  PLANNED INTERVENTIONS: Therapeutic exercises, Therapeutic activity, Patient/Family education, Self Care, Orthotic/Fit training, Prosthetic training, DME instructions, Manual therapy, and re-eval  PLAN FOR NEXT SESSION: CDT Lt UE, continue UE stretches  Garments: waiting for sleeve to come.    Idamae Lusher, PT 07/14/2023, 8:53 AM  [facility]  [facility] [address] [place], [place], [address] Phone: [phone]   Fax:  [phone]

## 2023-07-14 NOTE — Patient Instructions (Signed)
Deep Effective Breath   1.) Standing, sitting, or laying down, place both hands on the belly. Take a deep breath IN, expanding the belly; then breath OUT, contracting the belly.   Copyright  VHI. All rights reserved.  Axilla to Axilla - Sweep   2.) Circles in both armpits  3.) then pump _5__ times from left to right across chest to uninvolved armpit, making a pathway.   Axilla to Inguinal Nodes - Sweep   4.) On involved side, make 5 circles at groin at panty line,   5.) then pump _5__ times from armpit along side of trunk to outer hip, making your other pathway.   Arm Posterior: Elbow to Shoulder - Sweep   6.) Pump _5__ times from back of elbow to top of shoulder.  7.) Then inner to outer upper arm _5_ times, then outer arm again _5_ times. Then back to the pathways _2-3_ times.    ARM: Volar Wrist to Elbow - Sweep   8.) Pump or stationary circles _5__ times from wrist to elbow making sure to do both sides of the forearm.  ARM: Dorsum of Hand to Shoulder - Sweep   9.) Pump or stationary circles _5__ times on back of hand including knuckle spaces and individual fingers if needed working up towards the wrist,   10.) then retrace all your steps working back up the forearm, doing both sides; upper outer arm and back to your pathways _2-3_ times each. Then do 5 circles again at uninvolved armpit and involved groin where you started! Good job!! Do __1_ time per day.  Copyright  VHI. All rights reserved.

## 2023-07-16 ENCOUNTER — Ambulatory Visit: Payer: Medicare Other

## 2023-07-16 DIAGNOSIS — I972 Postmastectomy lymphedema syndrome: Secondary | ICD-10-CM | POA: Diagnosis not present

## 2023-07-16 DIAGNOSIS — M25612 Stiffness of left shoulder, not elsewhere classified: Secondary | ICD-10-CM

## 2023-07-16 DIAGNOSIS — Z9012 Acquired absence of left breast and nipple: Secondary | ICD-10-CM

## 2023-07-16 DIAGNOSIS — Z170 Estrogen receptor positive status [ER+]: Secondary | ICD-10-CM

## 2023-07-16 NOTE — Therapy (Signed)
OUTPATIENT PHYSICAL THERAPY  UPPER EXTREMITY ONCOLOGY TREATMENT  Patient Name: Madison Becker MRN: [id number] DOB:10/14/1956, 67 y.o., female Today's Date: 07/16/2023  END OF SESSION:  PT End of Session - 07/16/23 0802     Visit Number 11    Number of Visits 13    Date for PT Re-Evaluation 07/23/23    PT Start Time 0804    PT Stop Time 0900    PT Time Calculation (min) 56 min    Activity Tolerance Patient tolerated treatment well    Behavior During Therapy [facility] for tasks assessed/performed                   Past Medical History:  Diagnosis Date   Breast cancer (HCC) 09/2021   GERD (gastroesophageal reflux disease)    History of cervical dysplasia    Port-A-Cath in place 04/09/2022   Past Surgical History:  Procedure Laterality Date   IR IMAGING GUIDED PORT INSERTION  04/09/2022   LEEP  2008   MASTECTOMY W/ SENTINEL NODE BIOPSY Left 03/05/2022   Procedure: LEFT MASTECTOMY WITH SENTINEL LYMPH NODE BIOPSY;  Surgeon: Abigail Miyamoto, MD;  Location: [facility];  Service: General;  Laterality: Left;   PORT-A-CATH REMOVAL Right 10/23/2022   Procedure: REMOVAL PORT-A-CATH;  Surgeon: Abigail Miyamoto, MD;  Location: [facility];  Service: General;  Laterality: Right;   Patient Active Problem List   Diagnosis Date Noted   Hilar adenopathy 05/08/2022   Multifocal pneumonia 05/07/2022   S/P left mastectomy 03/05/2022   Malignant neoplasm of upper-inner quadrant of left breast in female, estrogen receptor positive (HCC) 10/22/2021    PCP: None  REFERRING PROVIDER: Dr. Pamelia Hoit  REFERRING DIAG: Left arm stiffness , Lt mastectomy   THERAPY DIAG:  Postmastectomy lymphedema  Stiffness of left shoulder, not elsewhere classified  S/P left mastectomy  Malignant neoplasm of upper-inner quadrant of left breast in female, estrogen receptor positive (HCC)  ONSET DATE: 03/05/22  Rationale for Evaluation and Treatment:  Rehabilitation  SUBJECTIVE:                                                                                                                                                                                           SUBJECTIVE STATEMENT: My Lt shoulder has felt a bit stiff the past few days but probably from all the weather we've been having.     PERTINENT HISTORY: Left mastectomy 03/05/22 with 1 positive node removed of 2.  Completed chemotherapy on antiestrogens. No radiation  PAIN:  Are you having pain? No, just Lt shoulder stiffness  PRECAUTIONS: left lymphedema risk   WEIGHT BEARING RESTRICTIONS:  No  FALLS:  Has patient fallen in last 6 months? 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     OBJECTIVE:  COGNITION: Overall cognitive status: Within functional limits for tasks assessed   PALPATION: No pitting. PROM mainly full with no significant pain or pulling. Scar moving well.   OBSERVATIONS / OTHER ASSESSMENTS: skin vs edema left lateral chest  POSTURE: rounded shoulders   UPPER EXTREMITY AROM/PROM:  A/PROM RIGHT   eval   Shoulder extension 55  Shoulder flexion 155  Shoulder abduction 160  Shoulder internal rotation   Shoulder external rotation 85    (Blank rows = not tested)  A/PROM LEFT   eval  Shoulder extension 60  Shoulder flexion 160  Shoulder abduction 160  Shoulder internal rotation   Shoulder external rotation 80 - tight in axilla     (Blank rows = not tested)  UPPER EXTREMITY STRENGTH:   LYMPHEDEMA ASSESSMENTS:   LANDMARK RIGHT  eval  At axilla  35  15 cm proximal to olecranon process 34  10 cm proximal to olecranon process 33  Olecranon process 29.8  15 cm proximal to ulnar styloid process 25.1  10 cm  proximal to ulnar styloid process 21.3  Just proximal to ulnar styloid process 17.5  Across hand at thumb web space 20.2  At base of 2nd digit 6.9  (Blank rows = not tested)  LANDMARK LEFT  eval LEFT 06/17/23 LEFT 06/23/23 LEFT 06/29/23  At axilla  37 36.6 37.7 37.2  15 cm proximal to olecranon process 35.5 33.7 33.4 34.3  10 cm proximal to olecranon process 35 33 33 34.4  Olecranon process 29.8 27.8 27.1 27.1  15 cm proximal to ulnar styloid process 25.6 25.3 24.6 25.2  10 cm proximal to ulnar styloid process 21.6 21.7 21.4 20.6  Just proximal to ulnar styloid process 18.5 17.3 17 17.5  Across hand at thumb web space 20.2 18.8 18.2 19.7  At base of 2nd digit 6.7 6.1 6 6.5  (Blank rows = not tested) 11  difference   L-DEX LYMPHEDEMA SCREENING: (no baseline)    QUICK DASH SURVEY: 43%   TODAY'S TREATMENT:                                                                                                                                          DATE: 07/16/23: Manual Therapy Manual lymph drainage in supine as follows: short neck, right axillary nodes, left inguinal nodes, 5 diaphragmatic breaths; anterior inter-axillary anastamoses, left axillo-inguinal anastamoses. Left upper extremity from fingers and dorsal hand to lateral shoulder redirecting along pathways.  P/ROM to Lt shoulder into  flex, abd and D2 with scapular depression by therapist throughout Compression bandaging as follows: cocoa butter, medium TG soft, elastamull to digits 1-4, artiflex from hand to axilla, 1 6 cm bandage at hand, 1 8 cm bandage from wrist to axilla with "X" at elbow, 1 10 cm bandage from wrist to axilla.  07/14/23: Gave pt MLD handout Manual lymph drainage in supine as follows: short neck, right axillary nodes, left inguinal nodes, 5 diaphragmatic breaths; anterior inter-axillary anastamoses, left axillo-inguinal anastamoses. Left upper extremity from fingers and dorsal hand to lateral  shoulder redirecting along pathways.  Compression bandaging as follows: lotion, medium TG soft, elastamull to digits 1-5, 1/2 grey foam to olecranon in area of discomfort, artiflex from hand to axilla, 1 6 cm bandage at hand, 1 8 cm bandage from wrist to axilla, 1 10 cm bandage from wrist to axilla.  07/08/23: Removed bandages  Manual lymph drainage in supine as follows: short neck, right axillary nodes, left inguinal nodes, 5 diaphragmatic breaths; anterior inter-axillary anastamoses, left axillo-inguinal anastamoses. Left upper extremity from fingers and dorsal hand to lateral shoulder redirecting along pathways.  Compression bandaging as follows: lotion, medium TG soft, elastamull to digits 1-5, 1/2 grey foam to olecranon in area of discomfort, artiflex from hand to axilla, 1 6 cm bandage at hand, 1 8 cm bandage from wrist to axilla, 1 10 cm bandage from wrist to axilla.  07/06/23: Removed bandages and assessed pt's independence.  Manual lymph drainage in supine as follows: short neck, right axillary nodes, left inguinal nodes, 5 diaphragmatic breaths; anterior inter-axillary anastamoses, left axillo-inguinal anastamoses. Left upper extremity from fingers and dorsal hand to lateral shoulder redirecting along pathways.  Compression bandaging as follows: medium TG soft, elastamull to digits 1-5, 1/2 grey foam to olecranon in area of discomfort, artiflex from hand to axilla, 1 6 cm bandage at hand, 1 8 cm bandage from wrist to axilla, 1 10 cm bandage from wrist to axilla.      PATIENT EDUCATION:  Education details: self bandaging, MD Dareen Piano how to bandage your own arm Person educated: Patient Education method: Explanation, Demonstration, Tactile cues, and Verbal cues Education comprehension: verbalized understanding, returned demonstration, verbal cues required, and needs further education  HOME EXERCISE PROGRAM: Practice self bandaging Encouraged AROM, elevation, and distal to proximal  milking type massage until next visits.   ASSESSMENT:  CLINICAL IMPRESSION: Continued with CDT of Lt UE along with P/ROM of Lt shoulder as pt reported feeling increased stiffness today. Pt reports she has been working on self MLD and has no questions about this. Awaiting arrival of new compression garments.     OBJECTIVE IMPAIRMENTS: decreased activity tolerance, decreased knowledge of condition, decreased knowledge of use of DME, and increased edema.   ACTIVITY LIMITATIONS: none  PARTICIPATION LIMITATIONS: yard work  PERSONAL FACTORS: SLNB are also affecting patient's functional outcome.   REHAB POTENTIAL: Excellent  CLINICAL DECISION MAKING: Stable/uncomplicated  EVALUATION COMPLEXITY: Low  GOALS: Goals reviewed with patient? Yes  SHORT TERM GOALS = LTGs: Target date: 07/23/23  Pt will plateau left UE reduction to be measured for UE compression Baseline: Goal status: MET  2.  Pt will be ind with self MLD for the UE Baseline:  Goal status: MET  3.  Pt will be ind with self bandaging and where to get supplies  Baseline:  Goal status: MET  4.  Pt will be ind with stretches for any continued tightness Baseline:  Goal status: INITIAL  PLAN:  PT FREQUENCY: 2x/week (pt  may be ok with 3 if needed but wanted to try self bandage)  PT DURATION: 6 weeks  PLANNED INTERVENTIONS: Therapeutic exercises, Therapeutic activity, Patient/Family education, Self Care, Orthotic/Fit training, Prosthetic training, DME instructions, Manual therapy, and re-eval  PLAN FOR NEXT SESSION: CDT Lt UE, continue UE stretches  Garments: waiting for sleeve to come.    Hermenia Bers, PTA 07/16/2023, 9:18 AM  [facility] [address] [place], [place], [address] Phone: [phone]   Fax:  [phone]

## 2023-07-20 ENCOUNTER — Ambulatory Visit: Payer: Medicare Other | Admitting: Physical Therapy

## 2023-07-22 ENCOUNTER — Telehealth: Payer: Self-pay

## 2023-07-22 ENCOUNTER — Ambulatory Visit: Payer: Medicare Other | Admitting: Physical Therapy

## 2023-07-22 ENCOUNTER — Encounter: Payer: Self-pay | Admitting: Physical Therapy

## 2023-07-22 DIAGNOSIS — Z170 Estrogen receptor positive status [ER+]: Secondary | ICD-10-CM

## 2023-07-22 DIAGNOSIS — I972 Postmastectomy lymphedema syndrome: Secondary | ICD-10-CM

## 2023-07-22 DIAGNOSIS — Z9012 Acquired absence of left breast and nipple: Secondary | ICD-10-CM

## 2023-07-22 DIAGNOSIS — M25612 Stiffness of left shoulder, not elsewhere classified: Secondary | ICD-10-CM

## 2023-07-22 NOTE — Therapy (Signed)
OUTPATIENT PHYSICAL THERAPY  UPPER EXTREMITY ONCOLOGY TREATMENT  Patient Name: Madison Becker MRN: [id number] DOB:25-Jul-1956, 67 y.o., female Today's Date: 07/22/2023  END OF SESSION:  PT End of Session - 07/22/23 0807     Visit Number 12    Number of Visits 13    Date for PT Re-Evaluation 07/23/23    PT Start Time 0806    PT Stop Time 0851    PT Time Calculation (min) 45 min    Activity Tolerance Patient tolerated treatment well    Behavior During Therapy [facility] for tasks assessed/performed                   Past Medical History:  Diagnosis Date   Breast cancer (HCC) 09/2021   GERD (gastroesophageal reflux disease)    History of cervical dysplasia    Port-A-Cath in place 04/09/2022   Past Surgical History:  Procedure Laterality Date   IR IMAGING GUIDED PORT INSERTION  04/09/2022   LEEP  2008   MASTECTOMY W/ SENTINEL NODE BIOPSY Left 03/05/2022   Procedure: LEFT MASTECTOMY WITH SENTINEL LYMPH NODE BIOPSY;  Surgeon: Abigail Miyamoto, MD;  Location: [facility];  Service: General;  Laterality: Left;   PORT-A-CATH REMOVAL Right 10/23/2022   Procedure: REMOVAL PORT-A-CATH;  Surgeon: Abigail Miyamoto, MD;  Location: [facility];  Service: General;  Laterality: Right;   Patient Active Problem List   Diagnosis Date Noted   Hilar adenopathy 05/08/2022   Multifocal pneumonia 05/07/2022   S/P left mastectomy 03/05/2022   Malignant neoplasm of upper-inner quadrant of left breast in female, estrogen receptor positive (HCC) 10/22/2021    PCP: None  REFERRING PROVIDER: Dr. Pamelia Hoit  REFERRING DIAG: Left arm stiffness , Lt mastectomy   THERAPY DIAG:  Postmastectomy lymphedema  Stiffness of left shoulder, not elsewhere classified  S/P left mastectomy  Malignant neoplasm of upper-inner quadrant of left breast in female, estrogen receptor positive (HCC)  ONSET DATE: 03/05/22  Rationale for Evaluation and Treatment:  Rehabilitation  SUBJECTIVE:                                                                                                                                                                                           SUBJECTIVE STATEMENT: My arm has been feeling better.   PERTINENT HISTORY: Left mastectomy 03/05/22 with 1 positive node removed of 2.  Completed chemotherapy on antiestrogens. No radiation  PAIN:  Are you having pain? No, just Lt shoulder stiffness  PRECAUTIONS: left lymphedema risk   WEIGHT BEARING RESTRICTIONS: No  FALLS:  Has patient fallen in last 6 months? 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     OBJECTIVE:  COGNITION: Overall cognitive status: Within functional limits for tasks assessed   PALPATION: No pitting. PROM mainly full with no significant pain or pulling. Scar moving well.   OBSERVATIONS / OTHER ASSESSMENTS: skin vs edema left lateral chest  POSTURE: rounded shoulders   UPPER EXTREMITY AROM/PROM:  A/PROM RIGHT   eval   Shoulder extension 55  Shoulder flexion 155  Shoulder abduction 160  Shoulder internal rotation   Shoulder external rotation 85    (Blank rows = not tested)  A/PROM LEFT   eval  Shoulder extension 60  Shoulder flexion 160  Shoulder abduction 160  Shoulder internal rotation   Shoulder external rotation 80 - tight in axilla     (Blank rows = not tested)  UPPER EXTREMITY STRENGTH:   LYMPHEDEMA ASSESSMENTS:   LANDMARK RIGHT  eval  At axilla  35  15 cm proximal to olecranon process 34  10 cm proximal to olecranon process 33  Olecranon process 29.8  15 cm proximal to ulnar styloid process 25.1  10 cm proximal to ulnar styloid process 21.3  Just proximal to ulnar styloid process  17.5  Across hand at thumb web space 20.2  At base of 2nd digit 6.9  (Blank rows = not tested)  LANDMARK LEFT  eval LEFT 06/17/23 LEFT 06/23/23 LEFT 06/29/23  At axilla  37 36.6 37.7 37.2  15 cm proximal to olecranon process 35.5 33.7 33.4 34.3  10 cm proximal to olecranon process 35 33 33 34.4  Olecranon process 29.8 27.8 27.1 27.1  15 cm proximal to ulnar styloid process 25.6 25.3 24.6 25.2  10 cm proximal to ulnar styloid process 21.6 21.7 21.4 20.6  Just proximal to ulnar styloid process 18.5 17.3 17 17.5  Across hand at thumb web space 20.2 18.8 18.2 19.7  At base of 2nd digit 6.7 6.1 6 6.5  (Blank rows = not tested) 11  difference   L-DEX LYMPHEDEMA SCREENING: (no baseline)    QUICK DASH SURVEY: 43%   TODAY'S TREATMENT:                                                                                                                                          DATE: 07/22/23: Manual Therapy Manual lymph drainage in supine as follows: short neck, right axillary nodes, left inguinal nodes, 5 diaphragmatic breaths; anterior inter-axillary anastamoses, left axillo-inguinal anastamoses. Left upper extremity from fingers and dorsal hand to lateral shoulder redirecting along pathways.  Compression bandaging as follows: cocoa butter, medium TG soft, elastamull to digits 1-4, artiflex from hand to axilla, 1 6 cm bandage at  hand, 1 8 cm bandage from wrist to axilla with "X" at elbow, 1 10 cm bandage from wrist to axilla.  07/16/23: Manual Therapy Manual lymph drainage in supine as follows: short neck, right axillary nodes, left inguinal nodes, 5 diaphragmatic breaths; anterior inter-axillary anastamoses, left axillo-inguinal anastamoses. Left upper extremity from fingers and dorsal hand to lateral shoulder redirecting along pathways.  P/ROM to Lt shoulder into flex, abd and D2 with scapular depression by therapist throughout Compression bandaging as follows: cocoa  butter, medium TG soft, elastamull to digits 1-4, artiflex from hand to axilla, 1 6 cm bandage at hand, 1 8 cm bandage from wrist to axilla with "X" at elbow, 1 10 cm bandage from wrist to axilla.  07/14/23: Gave pt MLD handout Manual lymph drainage in supine as follows: short neck, right axillary nodes, left inguinal nodes, 5 diaphragmatic breaths; anterior inter-axillary anastamoses, left axillo-inguinal anastamoses. Left upper extremity from fingers and dorsal hand to lateral shoulder redirecting along pathways.  Compression bandaging as follows: lotion, medium TG soft, elastamull to digits 1-5, 1/2 grey foam to olecranon in area of discomfort, artiflex from hand to axilla, 1 6 cm bandage at hand, 1 8 cm bandage from wrist to axilla, 1 10 cm bandage from wrist to axilla.  07/08/23: Removed bandages  Manual lymph drainage in supine as follows: short neck, right axillary nodes, left inguinal nodes, 5 diaphragmatic breaths; anterior inter-axillary anastamoses, left axillo-inguinal anastamoses. Left upper extremity from fingers and dorsal hand to lateral shoulder redirecting along pathways.  Compression bandaging as follows: lotion, medium TG soft, elastamull to digits 1-5, 1/2 grey foam to olecranon in area of discomfort, artiflex from hand to axilla, 1 6 cm bandage at hand, 1 8 cm bandage from wrist to axilla, 1 10 cm bandage from wrist to axilla.  07/06/23: Removed bandages and assessed pt's independence.  Manual lymph drainage in supine as follows: short neck, right axillary nodes, left inguinal nodes, 5 diaphragmatic breaths; anterior inter-axillary anastamoses, left axillo-inguinal anastamoses. Left upper extremity from fingers and dorsal hand to lateral shoulder redirecting along pathways.  Compression bandaging as follows: medium TG soft, elastamull to digits 1-5, 1/2 grey foam to olecranon in area of discomfort, artiflex from hand to axilla, 1 6 cm bandage at hand, 1 8 cm bandage from wrist to  axilla, 1 10 cm bandage from wrist to axilla.      PATIENT EDUCATION:  Education details: self bandaging, MD Dareen Piano how to bandage your own arm Person educated: Patient Education method: Explanation, Demonstration, Tactile cues, and Verbal cues Education comprehension: verbalized understanding, returned demonstration, verbal cues required, and needs further education  HOME EXERCISE PROGRAM: Practice self bandaging Encouraged AROM, elevation, and distal to proximal milking type massage until next visits.   ASSESSMENT:  CLINICAL IMPRESSION: Pt has not ordered her garments yet due to finances but plans to do so soon. Continued with MLD and compression bandaging.   OBJECTIVE IMPAIRMENTS: decreased activity tolerance, decreased knowledge of condition, decreased knowledge of use of DME, and increased edema.   ACTIVITY LIMITATIONS: none  PARTICIPATION LIMITATIONS: yard work  PERSONAL FACTORS: SLNB are also affecting patient's functional outcome.   REHAB POTENTIAL: Excellent  CLINICAL DECISION MAKING: Stable/uncomplicated  EVALUATION COMPLEXITY: Low  GOALS: Goals reviewed with patient? Yes  SHORT TERM GOALS = LTGs: Target date: 07/23/23  Pt will plateau left UE reduction to be measured for UE compression Baseline: Goal status: MET  2.  Pt will be ind with self MLD for the UE Baseline:  Goal status: MET  3.  Pt will be ind with self bandaging and where to get supplies  Baseline:  Goal status: MET  4.  Pt will be ind with stretches for any continued tightness Baseline:  Goal status: INITIAL  PLAN:  PT FREQUENCY: 2x/week (pt may be ok with 3 if needed but wanted to try self bandage)  PT DURATION: 6 weeks  PLANNED INTERVENTIONS: Therapeutic exercises, Therapeutic activity, Patient/Family education, Self Care, Orthotic/Fit training, Prosthetic training, DME instructions, Manual therapy, and re-eval  PLAN FOR NEXT SESSION: CDT Lt UE, continue UE stretches  Garments:  waiting for sleeve to come.    [address] [place], [place] 07/22/2023, 9:05 AM  [facility] [address] [place], [place], [address] Phone: [phone]   Fax:  [phone]

## 2023-07-22 NOTE — Telephone Encounter (Signed)
Called pt per MD to advise [employer] testing was negative/not detected. Pt verbalized understanding of results and knows [employer] will be in touch to schedule 3 mo repeat lab.   

## 2023-07-23 ENCOUNTER — Encounter: Payer: Self-pay | Admitting: Hematology and Oncology

## 2023-07-29 ENCOUNTER — Ambulatory Visit: Payer: Medicare Other | Attending: Hematology and Oncology

## 2023-07-29 DIAGNOSIS — Z9012 Acquired absence of left breast and nipple: Secondary | ICD-10-CM | POA: Insufficient documentation

## 2023-07-29 DIAGNOSIS — M25612 Stiffness of left shoulder, not elsewhere classified: Secondary | ICD-10-CM | POA: Insufficient documentation

## 2023-07-29 DIAGNOSIS — Z170 Estrogen receptor positive status [ER+]: Secondary | ICD-10-CM | POA: Insufficient documentation

## 2023-07-29 DIAGNOSIS — C50212 Malignant neoplasm of upper-inner quadrant of left female breast: Secondary | ICD-10-CM | POA: Diagnosis present

## 2023-07-29 DIAGNOSIS — I972 Postmastectomy lymphedema syndrome: Secondary | ICD-10-CM | POA: Diagnosis present

## 2023-07-29 NOTE — Addendum Note (Signed)
Addended by: Jeanella Craze, Pamala Duffel L on: 07/29/2023 01:00 PM   Modules accepted: Orders

## 2023-07-29 NOTE — Therapy (Addendum)
OUTPATIENT PHYSICAL THERAPY  UPPER EXTREMITY ONCOLOGY TREATMENT  Patient Name: Madison Becker MRN: [id number] DOB:[birth year], 67 y.o., female Today's Date: 07/29/2023  END OF SESSION:  PT End of Session - 07/29/23 0808     Visit Number 13    Number of Visits 21    Date for PT Re-Evaluation 08/26/23    PT Start Time 0805    PT Stop Time 0859    PT Time Calculation (min) 54 min    Activity Tolerance Patient tolerated treatment well    Behavior During Therapy [facility] for tasks assessed/performed                   Past Medical History:  Diagnosis Date   Breast cancer (HCC) 09/2021   GERD (gastroesophageal reflux disease)    History of cervical dysplasia    Port-A-Cath in place 04/09/2022   Past Surgical History:  Procedure Laterality Date   IR IMAGING GUIDED PORT INSERTION  04/09/2022   LEEP  2008   MASTECTOMY W/ SENTINEL NODE BIOPSY Left 03/05/2022   Procedure: LEFT MASTECTOMY WITH SENTINEL LYMPH NODE BIOPSY;  Surgeon: Abigail Miyamoto, MD;  Location: [facility];  Service: General;  Laterality: Left;   PORT-A-CATH REMOVAL Right 10/23/2022   Procedure: REMOVAL PORT-A-CATH;  Surgeon: Abigail Miyamoto, MD;  Location: [facility];  Service: General;  Laterality: Right;   Patient Active Problem List   Diagnosis Date Noted   Hilar adenopathy 05/08/2022   Multifocal pneumonia 05/07/2022   S/P left mastectomy 03/05/2022   Malignant neoplasm of upper-inner quadrant of left breast in female, estrogen receptor positive (HCC) 10/22/2021    PCP: None  REFERRING PROVIDER: Dr. Pamelia Hoit  REFERRING DIAG: Left arm stiffness , Lt mastectomy   THERAPY DIAG:  Postmastectomy lymphedema  Stiffness of left shoulder, not elsewhere classified  S/P left mastectomy  Malignant neoplasm of upper-inner quadrant of left breast in female, estrogen receptor positive (HCC)  ONSET DATE: 03/05/22  Rationale for Evaluation and Treatment:  Rehabilitation  SUBJECTIVE:                                                                                                                                                                                           SUBJECTIVE STATEMENT: I took my bandages off last night. Wrapping at home and doing the self MLD is going well.   PERTINENT HISTORY: Left mastectomy 03/05/22 with 1 positive node removed of 2.  Completed chemotherapy on antiestrogens. No radiation  PAIN:  Are you having pain? No, just Lt shoulder stiffness  PRECAUTIONS: left lymphedema risk   WEIGHT BEARING RESTRICTIONS: No  FALLS:  Has  patient fallen in last 6 months? 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     OBJECTIVE:  COGNITION: Overall cognitive status: Within functional limits for tasks assessed   PALPATION: No pitting. PROM mainly full with no significant pain or pulling. Scar moving well.   OBSERVATIONS / OTHER ASSESSMENTS: skin vs edema left lateral chest  POSTURE: rounded shoulders   UPPER EXTREMITY AROM/PROM:  A/PROM RIGHT   eval   Shoulder extension 55  Shoulder flexion 155  Shoulder abduction 160  Shoulder internal rotation   Shoulder external rotation 85    (Blank rows = not tested)  A/PROM LEFT   eval  Shoulder extension 60  Shoulder flexion 160  Shoulder abduction 160  Shoulder internal rotation   Shoulder external rotation 80 - tight in axilla     (Blank rows = not tested)  UPPER EXTREMITY STRENGTH:   LYMPHEDEMA ASSESSMENTS:   LANDMARK RIGHT  eval  At axilla  35  15 cm proximal to olecranon process 34  10 cm proximal to olecranon process 33  Olecranon process 29.8  15 cm proximal to ulnar styloid process 25.1  10 cm proximal to ulnar styloid  process 21.3  Just proximal to ulnar styloid process 17.5  Across hand at thumb web space 20.2  At base of 2nd digit 6.9  (Blank rows = not tested)  LANDMARK LEFT  eval LEFT 06/17/23 LEFT 06/23/23 LEFT 06/29/23  At axilla  37 36.6 37.7 37.2  15 cm proximal to olecranon process 35.5 33.7 33.4 34.3  10 cm proximal to olecranon process 35 33 33 34.4  Olecranon process 29.8 27.8 27.1 27.1  15 cm proximal to ulnar styloid process 25.6 25.3 24.6 25.2  10 cm proximal to ulnar styloid process 21.6 21.7 21.4 20.6  Just proximal to ulnar styloid process 18.5 17.3 17 17.5  Across hand at thumb web space 20.2 18.8 18.2 19.7  At base of 2nd digit 6.7 6.1 6 6.5  (Blank rows = not tested) 11  difference   L-DEX LYMPHEDEMA SCREENING: (no baseline)    QUICK DASH SURVEY: 43%   TODAY'S TREATMENT:                                                                                                                                          DATE: 07/29/23: Manual Therapy Manual lymph drainage in supine as follows: short neck, 5 diaphragmatic breaths, right axillary nodes, left inguinal nodes; anterior inter-axillary anastamoses, left axillo-inguinal anastamoses. Left upper extremity from fingers and dorsal hand to lateral shoulder redirecting along pathways.  Compression bandaging as follows: cocoa butter, medium TG soft, elastamull  to digits 1-4, artiflex from hand to axilla, 1 6 cm bandage at hand, 1 8 cm bandage from wrist to axilla with "X" at elbow, 1 10 cm bandage from wrist to axilla.  07/22/23: Manual Therapy Manual lymph drainage in supine as follows: short neck, right axillary nodes, left inguinal nodes, 5 diaphragmatic breaths; anterior inter-axillary anastamoses, left axillo-inguinal anastamoses. Left upper extremity from fingers and dorsal hand to lateral shoulder redirecting along pathways.  Compression bandaging as follows: cocoa butter, medium TG soft, elastamull to digits  1-4, artiflex from hand to axilla, 1 6 cm bandage at hand, 1 8 cm bandage from wrist to axilla with "X" at elbow, 1 10 cm bandage from wrist to axilla.  07/16/23: Manual Therapy Manual lymph drainage in supine as follows: short neck, right axillary nodes, left inguinal nodes, 5 diaphragmatic breaths; anterior inter-axillary anastamoses, left axillo-inguinal anastamoses. Left upper extremity from fingers and dorsal hand to lateral shoulder redirecting along pathways.  P/ROM to Lt shoulder into flex, abd and D2 with scapular depression by therapist throughout Compression bandaging as follows: cocoa butter, medium TG soft, elastamull to digits 1-4, artiflex from hand to axilla, 1 6 cm bandage at hand, 1 8 cm bandage from wrist to axilla with "X" at elbow, 1 10 cm bandage from wrist to axilla.  07/14/23: Gave pt MLD handout Manual lymph drainage in supine as follows: short neck, right axillary nodes, left inguinal nodes, 5 diaphragmatic breaths; anterior inter-axillary anastamoses, left axillo-inguinal anastamoses. Left upper extremity from fingers and dorsal hand to lateral shoulder redirecting along pathways.  Compression bandaging as follows: lotion, medium TG soft, elastamull to digits 1-5, 1/2 grey foam to olecranon in area of discomfort, artiflex from hand to axilla, 1 6 cm bandage at hand, 1 8 cm bandage from wrist to axilla, 1 10 cm bandage from wrist to axilla.  07/08/23: Removed bandages  Manual lymph drainage in supine as follows: short neck, right axillary nodes, left inguinal nodes, 5 diaphragmatic breaths; anterior inter-axillary anastamoses, left axillo-inguinal anastamoses. Left upper extremity from fingers and dorsal hand to lateral shoulder redirecting along pathways.  Compression bandaging as follows: lotion, medium TG soft, elastamull to digits 1-5, 1/2 grey foam to olecranon in area of discomfort, artiflex from hand to axilla, 1 6 cm bandage at hand, 1 8 cm bandage from wrist to axilla,  1 10 cm bandage from wrist to axilla.  07/06/23: Removed bandages and assessed pt's independence.  Manual lymph drainage in supine as follows: short neck, right axillary nodes, left inguinal nodes, 5 diaphragmatic breaths; anterior inter-axillary anastamoses, left axillo-inguinal anastamoses. Left upper extremity from fingers and dorsal hand to lateral shoulder redirecting along pathways.  Compression bandaging as follows: medium TG soft, elastamull to digits 1-5, 1/2 grey foam to olecranon in area of discomfort, artiflex from hand to axilla, 1 6 cm bandage at hand, 1 8 cm bandage from wrist to axilla, 1 10 cm bandage from wrist to axilla.      PATIENT EDUCATION:  Education details: self bandaging, MD Dareen Piano how to bandage your own arm Person educated: Patient Education method: Explanation, Demonstration, Tactile cues, and Verbal cues Education comprehension: verbalized understanding, returned demonstration, verbal cues required, and needs further education  HOME EXERCISE PROGRAM: Practice self bandaging Encouraged AROM, elevation, and distal to proximal milking type massage until next visits.   ASSESSMENT:  CLINICAL IMPRESSION: Continued with MLD and compression bandaging to Lt UE. During session pt was briefly instructed in end AA/ROM stretches she can be doing in  her doorway.  OBJECTIVE IMPAIRMENTS: decreased activity tolerance, decreased knowledge of condition, decreased knowledge of use of DME, and increased edema.   ACTIVITY LIMITATIONS: none  PARTICIPATION LIMITATIONS: yard work  PERSONAL FACTORS: SLNB are also affecting patient's functional outcome.   REHAB POTENTIAL: Excellent  CLINICAL DECISION MAKING: Stable/uncomplicated  EVALUATION COMPLEXITY: Low  GOALS: Goals reviewed with patient? Yes  SHORT TERM GOALS = LTGs: Target date: 07/23/23  Pt will plateau left UE reduction to be measured for UE compression Baseline: Goal status: MET  2.  Pt will be ind with  self MLD for the UE Baseline:  Goal status: MET  3.  Pt will be ind with self bandaging and where to get supplies  Baseline:  Goal status: MET  4.  Pt will be ind with stretches for any continued tightness Baseline: Pt has been instructed in initial HEP stretches Goal status: ONGOING  PLAN:  PT FREQUENCY: 1-2x/week   PT DURATION: 4 weeks  PLANNED INTERVENTIONS: Therapeutic exercises, Therapeutic activity, Patient/Family education, Self Care, Orthotic/Fit training, Prosthetic training, DME instructions, Manual therapy, and re-eval  PLAN FOR NEXT SESSION: Renewal done today. Did pt order garments? Pt would like to decrease to 1-2x/wk until garments arrive as she is doing well with self MLD and self bandaging at home. CDT Lt UE, continue UE stretches  Garments: waiting for sleeve to come.    Hermenia Bers, PTA 07/29/2023, 9:06 AM  [facility] [address] [place], [place], [address] Phone: [phone]   Fax:  [phone]  Milagros Loll [place], PT 07/29/23 1:00 PM

## 2023-07-30 ENCOUNTER — Encounter: Payer: Self-pay | Admitting: Internal Medicine

## 2023-07-31 ENCOUNTER — Ambulatory Visit: Payer: Medicare Other | Admitting: Rehabilitation

## 2023-07-31 ENCOUNTER — Encounter: Payer: Self-pay | Admitting: Rehabilitation

## 2023-07-31 DIAGNOSIS — Z9012 Acquired absence of left breast and nipple: Secondary | ICD-10-CM

## 2023-07-31 DIAGNOSIS — M25612 Stiffness of left shoulder, not elsewhere classified: Secondary | ICD-10-CM

## 2023-07-31 DIAGNOSIS — Z170 Estrogen receptor positive status [ER+]: Secondary | ICD-10-CM

## 2023-07-31 DIAGNOSIS — I972 Postmastectomy lymphedema syndrome: Secondary | ICD-10-CM | POA: Diagnosis not present

## 2023-07-31 NOTE — Therapy (Signed)
OUTPATIENT PHYSICAL THERAPY  UPPER EXTREMITY ONCOLOGY TREATMENT  Patient Name: Madison [facility] MRN: [id number] DOB:1956-05-15, 67 y.o., female Today's Date: 07/31/2023  END OF SESSION:  PT End of Session - 07/31/23 0801     Visit Number 14   start Kx at 15   Number of Visits 21    Date for PT Re-Evaluation 08/26/23    PT Start Time 0804    PT Stop Time 0853    PT Time Calculation (min) 49 min    Activity Tolerance Patient tolerated treatment well    Behavior During Therapy [facility] for tasks assessed/performed                   Past Medical History:  Diagnosis Date   Breast cancer (HCC) 09/2021   GERD (gastroesophageal reflux disease)    History of cervical dysplasia    Port-A-Cath in place 04/09/2022   Past Surgical History:  Procedure Laterality Date   IR IMAGING GUIDED PORT INSERTION  04/09/2022   LEEP  2008   MASTECTOMY W/ SENTINEL NODE BIOPSY Left 03/05/2022   Procedure: LEFT MASTECTOMY WITH SENTINEL LYMPH NODE BIOPSY;  Surgeon: Abigail Miyamoto, MD;  Location: [facility];  Service: General;  Laterality: Left;   PORT-A-CATH REMOVAL Right 10/23/2022   Procedure: REMOVAL PORT-A-CATH;  Surgeon: Abigail Miyamoto, MD;  Location: [facility];  Service: General;  Laterality: Right;   Patient Active Problem List   Diagnosis Date Noted   Hilar adenopathy 05/08/2022   Multifocal pneumonia 05/07/2022   S/P left mastectomy 03/05/2022   Malignant neoplasm of upper-inner quadrant of left breast in female, estrogen receptor positive (HCC) 10/22/2021    PCP: None  REFERRING PROVIDER: Dr. Pamelia Hoit  REFERRING DIAG: Left arm stiffness , Lt mastectomy   THERAPY DIAG:  Postmastectomy lymphedema  Stiffness of left shoulder, not elsewhere classified  S/P left mastectomy  Malignant neoplasm of upper-inner quadrant of left breast in female, estrogen receptor positive (HCC)  ONSET DATE: 03/05/22  Rationale for Evaluation and Treatment:  Rehabilitation  SUBJECTIVE:                                                                                                                                                                                           SUBJECTIVE STATEMENT: I will be going back to work in 2 weeks.    PERTINENT HISTORY: Left mastectomy 03/05/22 with 1 positive node removed of 2.  Completed chemotherapy on antiestrogens. No radiation  PAIN:  Are you having pain? No, just Lt shoulder stiffness  PRECAUTIONS: left lymphedema risk   WEIGHT BEARING RESTRICTIONS: No  FALLS:  Has patient fallen  in last 6 months? 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     OBJECTIVE:  COGNITION: Overall cognitive status: Within functional limits for tasks assessed   PALPATION: No pitting. PROM mainly full with no significant pain or pulling. Scar moving well.   OBSERVATIONS / OTHER ASSESSMENTS: skin vs edema left lateral chest  POSTURE: rounded shoulders   UPPER EXTREMITY AROM/PROM:  A/PROM RIGHT   eval   Shoulder extension 55  Shoulder flexion 155  Shoulder abduction 160  Shoulder internal rotation   Shoulder external rotation 85    (Blank rows = not tested)  A/PROM LEFT   eval  Shoulder extension 60  Shoulder flexion 160  Shoulder abduction 160  Shoulder internal rotation   Shoulder external rotation 80 - tight in axilla     (Blank rows = not tested)  UPPER EXTREMITY STRENGTH:   LYMPHEDEMA ASSESSMENTS:   LANDMARK RIGHT  eval  At axilla  35  15 cm proximal to olecranon process 34  10 cm proximal to olecranon process 33  Olecranon process 29.8  15 cm proximal to ulnar styloid process 25.1  10 cm proximal to ulnar styloid process 21.3  Just proximal to ulnar styloid  process 17.5  Across hand at thumb web space 20.2  At base of 2nd digit 6.9  (Blank rows = not tested)  LANDMARK LEFT  eval LEFT 06/17/23 LEFT 06/23/23 LEFT 06/29/23 07/31/23  At axilla  37 36.6 37.7 37.2   15 cm proximal to olecranon process 35.5 33.7 33.4 34.3 34.5  10 cm proximal to olecranon process 35 33 33 34.4   Olecranon process 29.8 27.8 27.1 27.1 28  15  cm proximal to ulnar styloid process 25.6 25.3 24.6 25.2   10 cm proximal to ulnar styloid process 21.6 21.7 21.4 20.6 20.6  Just proximal to ulnar styloid process 18.5 17.3 17 17.5 17.5  Across hand at thumb web space 20.2 18.8 18.2 19.7 19.5  At base of 2nd digit 6.7 6.1 6 6.5 6.5  (Blank rows = not tested) 11  difference   L-DEX LYMPHEDEMA SCREENING: (no baseline)   QUICK DASH SURVEY: 43%   TODAY'S TREATMENT:                                                                                                                                          DATE: 07/31/23: Manual Therapy Manual lymph drainage in supine as follows: short neck, 5 diaphragmatic breaths, right axillary nodes, left inguinal nodes; anterior inter-axillary anastamoses, left axillo-inguinal anastamoses. Left upper extremity from fingers and dorsal hand to lateral shoulder redirecting along pathways.  Compression bandaging as  follows: cocoa butter, medium TG soft, elastamull to digits 1-4, artiflex from hand to axilla, 1 6 cm bandage at hand, 1 8 cm bandage from wrist to axilla with "X" at elbow, 1 10 cm bandage from wrist to axilla  07/29/23: Manual Therapy Manual lymph drainage in supine as follows: short neck, 5 diaphragmatic breaths, right axillary nodes, left inguinal nodes; anterior inter-axillary anastamoses, left axillo-inguinal anastamoses. Left upper extremity from fingers and dorsal hand to lateral shoulder redirecting along pathways.  Compression bandaging as follows: cocoa butter, medium TG soft, elastamull to digits 1-4, artiflex  from hand to axilla, 1 6 cm bandage at hand, 1 8 cm bandage from wrist to axilla with "X" at elbow, 1 10 cm bandage from wrist to axilla.  07/22/23: Manual Therapy Manual lymph drainage in supine as follows: short neck, right axillary nodes, left inguinal nodes, 5 diaphragmatic breaths; anterior inter-axillary anastamoses, left axillo-inguinal anastamoses. Left upper extremity from fingers and dorsal hand to lateral shoulder redirecting along pathways.  Compression bandaging as follows: cocoa butter, medium TG soft, elastamull to digits 1-4, artiflex from hand to axilla, 1 6 cm bandage at hand, 1 8 cm bandage from wrist to axilla with "X" at elbow, 1 10 cm bandage from wrist to axilla.  07/16/23: Manual Therapy Manual lymph drainage in supine as follows: short neck, right axillary nodes, left inguinal nodes, 5 diaphragmatic breaths; anterior inter-axillary anastamoses, left axillo-inguinal anastamoses. Left upper extremity from fingers and dorsal hand to lateral shoulder redirecting along pathways.  P/ROM to Lt shoulder into flex, abd and D2 with scapular depression by therapist throughout Compression bandaging as follows: cocoa butter, medium TG soft, elastamull to digits 1-4, artiflex from hand to axilla, 1 6 cm bandage at hand, 1 8 cm bandage from wrist to axilla with "X" at elbow, 1 10 cm bandage from wrist to axilla.  07/14/23: Gave pt MLD handout Manual lymph drainage in supine as follows: short neck, right axillary nodes, left inguinal nodes, 5 diaphragmatic breaths; anterior inter-axillary anastamoses, left axillo-inguinal anastamoses. Left upper extremity from fingers and dorsal hand to lateral shoulder redirecting along pathways.  Compression bandaging as follows: lotion, medium TG soft, elastamull to digits 1-5, 1/2 grey foam to olecranon in area of discomfort, artiflex from hand to axilla, 1 6 cm bandage at hand, 1 8 cm bandage from wrist to axilla, 1 10 cm bandage from wrist to  axilla.  07/08/23: Removed bandages  Manual lymph drainage in supine as follows: short neck, right axillary nodes, left inguinal nodes, 5 diaphragmatic breaths; anterior inter-axillary anastamoses, left axillo-inguinal anastamoses. Left upper extremity from fingers and dorsal hand to lateral shoulder redirecting along pathways.  Compression bandaging as follows: lotion, medium TG soft, elastamull to digits 1-5, 1/2 grey foam to olecranon in area of discomfort, artiflex from hand to axilla, 1 6 cm bandage at hand, 1 8 cm bandage from wrist to axilla, 1 10 cm bandage from wrist to axilla.  07/06/23: Removed bandages and assessed pt's independence.  Manual lymph drainage in supine as follows: short neck, right axillary nodes, left inguinal nodes, 5 diaphragmatic breaths; anterior inter-axillary anastamoses, left axillo-inguinal anastamoses. Left upper extremity from fingers and dorsal hand to lateral shoulder redirecting along pathways.  Compression bandaging as follows: medium TG soft, elastamull to digits 1-5, 1/2 grey foam to olecranon in area of discomfort, artiflex from hand to axilla, 1 6 cm bandage at hand, 1 8 cm bandage from wrist to axilla, 1 10 cm bandage from wrist to axilla.  PATIENT EDUCATION:  Education details: self bandaging, MD Dareen Piano how to bandage your own arm Person educated: Patient Education method: Explanation, Demonstration, Tactile cues, and Verbal cues Education comprehension: verbalized understanding, returned demonstration, verbal cues required, and needs further education  HOME EXERCISE PROGRAM: Practice self bandaging Encouraged AROM, elevation, and distal to proximal milking type massage until next visits.   ASSESSMENT:  CLINICAL IMPRESSION: Continued with MLD and compression bandaging to Lt UE. Pt is waiting for funds to complete her sunmed order.   OBJECTIVE IMPAIRMENTS: decreased activity tolerance, decreased knowledge of condition, decreased  knowledge of use of DME, and increased edema.   ACTIVITY LIMITATIONS: none  PARTICIPATION LIMITATIONS: yard work  PERSONAL FACTORS: SLNB are also affecting patient's functional outcome.   [facility] POTENTIAL: Excellent  CLINICAL DECISION MAKING: Stable/uncomplicated  EVALUATION COMPLEXITY: Low  GOALS: Goals reviewed with patient? Yes  SHORT TERM GOALS = LTGs: Target date: 07/23/23  Pt will plateau left UE reduction to be measured for UE compression Baseline: Goal status: MET  2.  Pt will be ind with self MLD for the UE Baseline:  Goal status: MET  3.  Pt will be ind with self bandaging and where to get supplies  Baseline:  Goal status: MET  4.  Pt will be ind with stretches for any continued tightness Baseline: Pt has been instructed in initial HEP stretches Goal status: ONGOING  PLAN:  PT FREQUENCY: 1-2x/week   PT DURATION: 4 weeks  PLANNED INTERVENTIONS: Therapeutic exercises, Therapeutic activity, Patient/Family education, Self Care, Orthotic/Fit training, Prosthetic training, DME instructions, Manual therapy, and re-eval  PLAN FOR NEXT SESSION: Renewal done today. Did pt order garments? Pt would like to decrease to 1-2x/wk until garments arrive as she is doing well with self MLD and self bandaging at home. CDT Lt UE, continue UE stretches  Garments: waiting for sleeve to come.    Idamae Lusher, PT 07/31/2023, 8:54 AM  [facility] [address] [place], [place], [address] Phone: [phone]   Fax:  [phone]  [facility], Madison [facility] 07/31/23 8:54 AM

## 2023-08-04 ENCOUNTER — Encounter: Payer: Self-pay | Admitting: Physical Therapy

## 2023-08-04 ENCOUNTER — Ambulatory Visit: Payer: Medicare Other | Admitting: Physical Therapy

## 2023-08-04 DIAGNOSIS — I972 Postmastectomy lymphedema syndrome: Secondary | ICD-10-CM | POA: Diagnosis not present

## 2023-08-04 DIAGNOSIS — Z170 Estrogen receptor positive status [ER+]: Secondary | ICD-10-CM

## 2023-08-04 DIAGNOSIS — Z9012 Acquired absence of left breast and nipple: Secondary | ICD-10-CM

## 2023-08-04 DIAGNOSIS — M25612 Stiffness of left shoulder, not elsewhere classified: Secondary | ICD-10-CM

## 2023-08-04 NOTE — Therapy (Signed)
OUTPATIENT PHYSICAL THERAPY  UPPER EXTREMITY ONCOLOGY TREATMENT  Patient Name: Madison [facility] MRN: [id number] DOB:April 17, 1956, 67 y.o., female Today's Date: 08/04/2023  END OF SESSION:  PT End of Session - 08/04/23 0806     Visit Number 15   add kx   Number of Visits 21    Date for PT Re-Evaluation 08/26/23    PT Start Time 0804    PT Stop Time 0858    PT Time Calculation (min) 54 min    Activity Tolerance Patient tolerated treatment well    Behavior During Therapy [facility] for tasks assessed/performed                   Past Medical History:  Diagnosis Date   Breast cancer (HCC) 09/2021   GERD (gastroesophageal reflux disease)    History of cervical dysplasia    Port-A-Cath in place 04/09/2022   Past Surgical History:  Procedure Laterality Date   IR IMAGING GUIDED PORT INSERTION  04/09/2022   LEEP  2008   MASTECTOMY W/ SENTINEL NODE BIOPSY Left 03/05/2022   Procedure: LEFT MASTECTOMY WITH SENTINEL LYMPH NODE BIOPSY;  Surgeon: Abigail Miyamoto, MD;  Location: [facility];  Service: General;  Laterality: Left;   PORT-A-CATH REMOVAL Right 10/23/2022   Procedure: REMOVAL PORT-A-CATH;  Surgeon: Abigail Miyamoto, MD;  Location: [facility];  Service: General;  Laterality: Right;   Patient Active Problem List   Diagnosis Date Noted   Hilar adenopathy 05/08/2022   Multifocal pneumonia 05/07/2022   S/P left mastectomy 03/05/2022   Malignant neoplasm of upper-inner quadrant of left breast in female, estrogen receptor positive (HCC) 10/22/2021    PCP: None  REFERRING PROVIDER: Dr. Pamelia Hoit  REFERRING DIAG: Left arm stiffness , Lt mastectomy   THERAPY DIAG:  Postmastectomy lymphedema  Stiffness of left shoulder, not elsewhere classified  S/P left mastectomy  Malignant neoplasm of upper-inner quadrant of left breast in female, estrogen receptor positive (HCC)  ONSET DATE: 03/05/22  Rationale for Evaluation and Treatment:  Rehabilitation  SUBJECTIVE:                                                                                                                                                                                           SUBJECTIVE STATEMENT: I will order my sleeve when I go back to work.   PERTINENT HISTORY: Left mastectomy 03/05/22 with 1 positive node removed of 2.  Completed chemotherapy on antiestrogens. No radiation  PAIN:  Are you having pain? No, just Lt shoulder stiffness  PRECAUTIONS: left lymphedema risk   WEIGHT BEARING RESTRICTIONS: No  FALLS:  Has patient fallen in last  6 months? 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     OBJECTIVE:  COGNITION: Overall cognitive status: Within functional limits for tasks assessed   PALPATION: No pitting. PROM mainly full with no significant pain or pulling. Scar moving well.   OBSERVATIONS / OTHER ASSESSMENTS: skin vs edema left lateral chest  POSTURE: rounded shoulders   UPPER EXTREMITY AROM/PROM:  A/PROM RIGHT   eval   Shoulder extension 55  Shoulder flexion 155  Shoulder abduction 160  Shoulder internal rotation   Shoulder external rotation 85    (Blank rows = not tested)  A/PROM LEFT   eval  Shoulder extension 60  Shoulder flexion 160  Shoulder abduction 160  Shoulder internal rotation   Shoulder external rotation 80 - tight in axilla     (Blank rows = not tested)  UPPER EXTREMITY STRENGTH:   LYMPHEDEMA ASSESSMENTS:   LANDMARK RIGHT  eval  At axilla  35  15 cm proximal to olecranon process 34  10 cm proximal to olecranon process 33  Olecranon process 29.8  15 cm proximal to ulnar styloid process 25.1  10 cm proximal to ulnar styloid process 21.3  Just proximal to ulnar  styloid process 17.5  Across hand at thumb web space 20.2  At base of 2nd digit 6.9  (Blank rows = not tested)  LANDMARK LEFT  eval LEFT 06/17/23 LEFT 06/23/23 LEFT 06/29/23 07/31/23  At axilla  37 36.6 37.7 37.2   15 cm proximal to olecranon process 35.5 33.7 33.4 34.3 34.5  10 cm proximal to olecranon process 35 33 33 34.4   Olecranon process 29.8 27.8 27.1 27.1 28  15  cm proximal to ulnar styloid process 25.6 25.3 24.6 25.2   10 cm proximal to ulnar styloid process 21.6 21.7 21.4 20.6 20.6  Just proximal to ulnar styloid process 18.5 17.3 17 17.5 17.5  Across hand at thumb web space 20.2 18.8 18.2 19.7 19.5  At base of 2nd digit 6.7 6.1 6 6.5 6.5  (Blank rows = not tested) 11  difference   L-DEX LYMPHEDEMA SCREENING: (no baseline)   QUICK DASH SURVEY: 43%   TODAY'S TREATMENT:                                                                                                                                          DATE: 08/04/23: Manual Therapy Manual lymph drainage in supine as follows: short neck, 5 diaphragmatic breaths, right axillary nodes, left inguinal nodes; anterior inter-axillary anastamoses, left axillo-inguinal anastamoses. Left upper extremity from fingers and dorsal hand to lateral shoulder redirecting along pathways.  Compression bandaging as follows: cocoa  butter, medium TG soft, (did not wrap fingers today per pt request) 1-4, artiflex from hand to axilla, 1 6 cm bandage at hand, 1 8 cm bandage from wrist to axilla with "X" at elbow, 1 10 cm bandage from wrist to axilla   07/31/23: Manual Therapy Manual lymph drainage in supine as follows: short neck, 5 diaphragmatic breaths, right axillary nodes, left inguinal nodes; anterior inter-axillary anastamoses, left axillo-inguinal anastamoses. Left upper extremity from fingers and dorsal hand to lateral shoulder redirecting along pathways.  Compression bandaging as follows: cocoa butter, medium TG soft,  elastamull to digits 1-4, artiflex from hand to axilla, 1 6 cm bandage at hand, 1 8 cm bandage from wrist to axilla with "X" at elbow, 1 10 cm bandage from wrist to axilla  07/29/23: Manual Therapy Manual lymph drainage in supine as follows: short neck, 5 diaphragmatic breaths, right axillary nodes, left inguinal nodes; anterior inter-axillary anastamoses, left axillo-inguinal anastamoses. Left upper extremity from fingers and dorsal hand to lateral shoulder redirecting along pathways.  Compression bandaging as follows: cocoa butter, medium TG soft, elastamull to digits 1-4, artiflex from hand to axilla, 1 6 cm bandage at hand, 1 8 cm bandage from wrist to axilla with "X" at elbow, 1 10 cm bandage from wrist to axilla.  07/22/23: Manual Therapy Manual lymph drainage in supine as follows: short neck, right axillary nodes, left inguinal nodes, 5 diaphragmatic breaths; anterior inter-axillary anastamoses, left axillo-inguinal anastamoses. Left upper extremity from fingers and dorsal hand to lateral shoulder redirecting along pathways.  Compression bandaging as follows: cocoa butter, medium TG soft, elastamull to digits 1-4, artiflex from hand to axilla, 1 6 cm bandage at hand, 1 8 cm bandage from wrist to axilla with "X" at elbow, 1 10 cm bandage from wrist to axilla.  07/16/23: Manual Therapy Manual lymph drainage in supine as follows: short neck, right axillary nodes, left inguinal nodes, 5 diaphragmatic breaths; anterior inter-axillary anastamoses, left axillo-inguinal anastamoses. Left upper extremity from fingers and dorsal hand to lateral shoulder redirecting along pathways.  P/ROM to Lt shoulder into flex, abd and D2 with scapular depression by therapist throughout Compression bandaging as follows: cocoa butter, medium TG soft, elastamull to digits 1-4, artiflex from hand to axilla, 1 6 cm bandage at hand, 1 8 cm bandage from wrist to axilla with "X" at elbow, 1 10 cm bandage from wrist to  axilla.  07/14/23: Gave pt MLD handout Manual lymph drainage in supine as follows: short neck, right axillary nodes, left inguinal nodes, 5 diaphragmatic breaths; anterior inter-axillary anastamoses, left axillo-inguinal anastamoses. Left upper extremity from fingers and dorsal hand to lateral shoulder redirecting along pathways.  Compression bandaging as follows: lotion, medium TG soft, elastamull to digits 1-5, 1/2 grey foam to olecranon in area of discomfort, artiflex from hand to axilla, 1 6 cm bandage at hand, 1 8 cm bandage from wrist to axilla, 1 10 cm bandage from wrist to axilla.  07/08/23: Removed bandages  Manual lymph drainage in supine as follows: short neck, right axillary nodes, left inguinal nodes, 5 diaphragmatic breaths; anterior inter-axillary anastamoses, left axillo-inguinal anastamoses. Left upper extremity from fingers and dorsal hand to lateral shoulder redirecting along pathways.  Compression bandaging as follows: lotion, medium TG soft, elastamull to digits 1-5, 1/2 grey foam to olecranon in area of discomfort, artiflex from hand to axilla, 1 6 cm bandage at hand, 1 8 cm bandage from wrist to axilla, 1 10 cm bandage from wrist to axilla.  07/06/23: Removed bandages and  assessed pt's independence.  Manual lymph drainage in supine as follows: short neck, right axillary nodes, left inguinal nodes, 5 diaphragmatic breaths; anterior inter-axillary anastamoses, left axillo-inguinal anastamoses. Left upper extremity from fingers and dorsal hand to lateral shoulder redirecting along pathways.  Compression bandaging as follows: medium TG soft, elastamull to digits 1-5, 1/2 grey foam to olecranon in area of discomfort, artiflex from hand to axilla, 1 6 cm bandage at hand, 1 8 cm bandage from wrist to axilla, 1 10 cm bandage from wrist to axilla.      PATIENT EDUCATION:  Education details: self bandaging, MD Dareen Piano how to bandage your own arm Person educated: Patient Education  method: Explanation, Demonstration, Tactile cues, and Verbal cues Education comprehension: verbalized understanding, returned demonstration, verbal cues required, and needs further education  HOME EXERCISE PROGRAM: Practice self bandaging Encouraged AROM, elevation, and distal to proximal milking type massage until next visits.   ASSESSMENT:  CLINICAL IMPRESSION: Continued with MLD and compression bandaging to Lt UE. Pt is waiting for funds to complete her sunmed order. She received a letter from [facility] but forgot to bring it in so therapist could review it. Attempted to leave fingers unbandaged today. Educated pt that if her fingers swell to wrap them.    OBJECTIVE IMPAIRMENTS: decreased activity tolerance, decreased knowledge of condition, decreased knowledge of use of DME, and increased edema.   ACTIVITY LIMITATIONS: none  PARTICIPATION LIMITATIONS: yard work  PERSONAL FACTORS: SLNB are also affecting patient's functional outcome.   [facility] POTENTIAL: Excellent  CLINICAL DECISION MAKING: Stable/uncomplicated  EVALUATION COMPLEXITY: Low  GOALS: Goals reviewed with patient? Yes  SHORT TERM GOALS = LTGs: Target date: 07/23/23  Pt will plateau left UE reduction to be measured for UE compression Baseline: Goal status: MET  2.  Pt will be ind with self MLD for the UE Baseline:  Goal status: MET  3.  Pt will be ind with self bandaging and where to get supplies  Baseline:  Goal status: MET  4.  Pt will be ind with stretches for any continued tightness Baseline: Pt has been instructed in initial HEP stretches Goal status: ONGOING  PLAN:  PT FREQUENCY: 1-2x/week   PT DURATION: 4 weeks  PLANNED INTERVENTIONS: Therapeutic exercises, Therapeutic activity, Patient/Family education, Self Care, Orthotic/Fit training, Prosthetic training, DME instructions, Manual therapy, and re-eval  PLAN FOR NEXT SESSION: Did pt order garments? Pt would like to decrease to 1-2x/wk until  garments arrive as she is doing well with self MLD and self bandaging at home. CDT Lt UE, continue UE stretches  Garments: waiting for sleeve to come.    Milagros Loll [place], [place] 08/04/2023, 9:57 AM  [facility] [address] [place], [place], [address] Phone: [phone]   Fax:  [phone]  Milagros Loll [place], [place] 08/04/23 9:57 AM

## 2023-08-07 ENCOUNTER — Ambulatory Visit: Payer: Medicare Other

## 2023-08-07 DIAGNOSIS — I972 Postmastectomy lymphedema syndrome: Secondary | ICD-10-CM

## 2023-08-07 DIAGNOSIS — C50212 Malignant neoplasm of upper-inner quadrant of left female breast: Secondary | ICD-10-CM

## 2023-08-07 DIAGNOSIS — M25612 Stiffness of left shoulder, not elsewhere classified: Secondary | ICD-10-CM

## 2023-08-07 DIAGNOSIS — Z9012 Acquired absence of left breast and nipple: Secondary | ICD-10-CM

## 2023-08-07 NOTE — Therapy (Signed)
OUTPATIENT PHYSICAL THERAPY  UPPER EXTREMITY ONCOLOGY TREATMENT  Patient Name: Madison Becker MRN: [id number] DOB:11/19/1956, 67 y.o., female Today's Date: 08/07/2023  END OF SESSION:  PT End of Session - 08/07/23 0801     Visit Number 16    Number of Visits 21    Date for PT Re-Evaluation 08/26/23    PT Start Time 0803    PT Stop Time 0851    PT Time Calculation (min) 48 min    Activity Tolerance Patient tolerated treatment well    Behavior During Therapy [facility] for tasks assessed/performed                   Past Medical History:  Diagnosis Date   Breast cancer (HCC) 09/2021   GERD (gastroesophageal reflux disease)    History of cervical dysplasia    Port-A-Cath in place 04/09/2022   Past Surgical History:  Procedure Laterality Date   IR IMAGING GUIDED PORT INSERTION  04/09/2022   LEEP  2008   MASTECTOMY W/ SENTINEL NODE BIOPSY Left 03/05/2022   Procedure: LEFT MASTECTOMY WITH SENTINEL LYMPH NODE BIOPSY;  Surgeon: Abigail Miyamoto, MD;  Location: [facility];  Service: General;  Laterality: Left;   PORT-A-CATH REMOVAL Right 10/23/2022   Procedure: REMOVAL PORT-A-CATH;  Surgeon: Abigail Miyamoto, MD;  Location: [facility];  Service: General;  Laterality: Right;   Patient Active Problem List   Diagnosis Date Noted   Hilar adenopathy 05/08/2022   Multifocal pneumonia 05/07/2022   S/P left mastectomy 03/05/2022   Malignant neoplasm of upper-inner quadrant of left breast in female, estrogen receptor positive (HCC) 10/22/2021    PCP: None  REFERRING PROVIDER: Dr. Pamelia Hoit  REFERRING DIAG: Left arm stiffness , Lt mastectomy   THERAPY DIAG:  Postmastectomy lymphedema  Stiffness of left shoulder, not elsewhere classified  S/P left mastectomy  Malignant neoplasm of upper-inner quadrant of left breast in female, estrogen receptor positive (HCC)  ONSET DATE: 03/05/22  Rationale for Evaluation and Treatment:  Rehabilitation  SUBJECTIVE:                                                                                                                                                                                           SUBJECTIVE STATEMENT:   My fingers did fine without wrapping them. I took the wraps off last night and showered this am.  PERTINENT HISTORY: Left mastectomy 03/05/22 with 1 positive node removed of 2.  Completed chemotherapy on antiestrogens. No radiation  PAIN:  Are you having pain? No, just Lt shoulder stiffness  PRECAUTIONS: left lymphedema risk   WEIGHT BEARING RESTRICTIONS: No  FALLS:  Has patient fallen in last 6 months? 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     OBJECTIVE:  COGNITION: Overall cognitive status: Within functional limits for tasks assessed   PALPATION: No pitting. PROM mainly full with no significant pain or pulling. Scar moving well.   OBSERVATIONS / OTHER ASSESSMENTS: skin vs edema left lateral chest  POSTURE: rounded shoulders   UPPER EXTREMITY AROM/PROM:  A/PROM RIGHT   eval   Shoulder extension 55  Shoulder flexion 155  Shoulder abduction 160  Shoulder internal rotation   Shoulder external rotation 85    (Blank rows = not tested)  A/PROM LEFT   eval  Shoulder extension 60  Shoulder flexion 160  Shoulder abduction 160  Shoulder internal rotation   Shoulder external rotation 80 - tight in axilla     (Blank rows = not tested)  UPPER EXTREMITY STRENGTH:   LYMPHEDEMA ASSESSMENTS:   LANDMARK RIGHT  eval  At axilla  35  15 cm proximal to olecranon process 34  10 cm proximal to olecranon process 33  Olecranon process 29.8  15 cm proximal to ulnar styloid process 25.1  10 cm proximal to  ulnar styloid process 21.3  Just proximal to ulnar styloid process 17.5  Across hand at thumb web space 20.2  At base of 2nd digit 6.9  (Blank rows = not tested)  LANDMARK LEFT  eval LEFT 06/17/23 LEFT 06/23/23 LEFT 06/29/23 07/31/23  At axilla  37 36.6 37.7 37.2   15 cm proximal to olecranon process 35.5 33.7 33.4 34.3 34.5  10 cm proximal to olecranon process 35 33 33 34.4   Olecranon process 29.8 27.8 27.1 27.1 28  15  cm proximal to ulnar styloid process 25.6 25.3 24.6 25.2   10 cm proximal to ulnar styloid process 21.6 21.7 21.4 20.6 20.6  Just proximal to ulnar styloid process 18.5 17.3 17 17.5 17.5  Across hand at thumb web space 20.2 18.8 18.2 19.7 19.5  At base of 2nd digit 6.7 6.1 6 6.5 6.5  (Blank rows = not tested) 11  difference   L-DEX LYMPHEDEMA SCREENING: (no baseline)   QUICK DASH SURVEY: 43%   TODAY'S TREATMENT:                                                                                                                                          DATE: 08/07/2023 Manual Therapy Manual lymph drainage in supine as follows: short neck, 5 diaphragmatic breaths, right axillary nodes, left inguinal nodes; anterior inter-axillary anastamoses, left axillo-inguinal anastamoses. Left upper extremity from fingers and dorsal hand to lateral shoulder redirecting along pathways.  Compression bandaging as follows: cocoa butter, medium TG soft, (did not wrap fingers today per pt request) , new artiflex from hand to axilla, 1 6 cm bandage at hand, 1 8 cm bandage from wrist to axilla with "X" at elbow, 1 10 cm bandage from wrist to axilla    08/04/23: Manual Therapy Manual lymph drainage in supine as follows: short neck, 5 diaphragmatic breaths, right axillary nodes, left inguinal nodes; anterior inter-axillary anastamoses, left axillo-inguinal anastamoses. Left upper extremity from fingers and dorsal hand to lateral shoulder redirecting along pathways.   Compression bandaging as follows: cocoa butter, medium TG soft, (did not wrap fingers today per pt request) 1-4, artiflex from hand to axilla, 1 6 cm bandage at hand, 1 8 cm bandage from wrist to axilla with "X" at elbow, 1 10 cm bandage from wrist to axilla   07/31/23: Manual Therapy Manual lymph drainage in supine as follows: short neck, 5 diaphragmatic breaths, right axillary nodes, left inguinal nodes; anterior inter-axillary anastamoses, left axillo-inguinal anastamoses. Left upper extremity from fingers and dorsal hand to lateral shoulder redirecting along pathways.  Compression bandaging as follows: cocoa butter, medium TG soft, elastamull to digits 1-4, artiflex from hand to axilla, 1 6 cm bandage at hand, 1 8 cm bandage from wrist to axilla with "X" at elbow, 1 10 cm bandage from wrist to axilla  07/29/23: Manual Therapy Manual lymph drainage in supine as follows: short neck, 5 diaphragmatic breaths, right axillary nodes, left inguinal nodes; anterior inter-axillary anastamoses, left axillo-inguinal anastamoses. Left upper extremity from fingers and dorsal hand to lateral shoulder redirecting along pathways.  Compression bandaging as follows: cocoa butter, medium TG soft, elastamull to digits 1-4, artiflex from hand to axilla, 1 6 cm bandage at hand, 1 8 cm bandage from wrist to axilla with "X" at elbow, 1 10 cm bandage from wrist to axilla.  07/22/23: Manual Therapy Manual lymph drainage in supine as follows: short neck, right axillary nodes, left inguinal nodes, 5 diaphragmatic breaths; anterior inter-axillary anastamoses, left axillo-inguinal anastamoses. Left upper extremity from fingers and dorsal hand to lateral shoulder redirecting along pathways.  Compression bandaging as follows: cocoa butter, medium TG soft, elastamull to digits 1-4, artiflex from hand to axilla, 1 6 cm bandage at hand, 1 8 cm bandage from wrist to axilla with "X" at elbow, 1 10 cm bandage from wrist to  axilla.  07/16/23: Manual Therapy Manual lymph drainage in supine as follows: short neck, right axillary nodes, left inguinal nodes, 5 diaphragmatic breaths; anterior inter-axillary anastamoses, left axillo-inguinal anastamoses. Left upper extremity from fingers and dorsal hand to lateral shoulder redirecting along pathways.  P/ROM to Lt shoulder into flex, abd and D2 with scapular depression by therapist throughout Compression bandaging as follows: cocoa butter, medium TG soft, elastamull to digits 1-4, artiflex from hand to axilla, 1 6 cm bandage at hand, 1 8 cm bandage from wrist to axilla with "X" at elbow, 1 10 cm bandage from wrist to axilla.  07/14/23: Gave pt MLD handout Manual lymph drainage in supine as follows: short neck, right axillary nodes, left inguinal nodes, 5 diaphragmatic breaths; anterior inter-axillary anastamoses, left axillo-inguinal anastamoses. Left upper extremity from fingers and dorsal hand to lateral shoulder redirecting along pathways.  Compression bandaging as follows: lotion, medium TG soft, elastamull to digits 1-5, 1/2 grey foam to olecranon in area of discomfort, artiflex from hand to axilla, 1 6 cm bandage at hand, 1 8 cm bandage from wrist to axilla, 1 10 cm bandage from  wrist to axilla.  07/08/23: Removed bandages  Manual lymph drainage in supine as follows: short neck, right axillary nodes, left inguinal nodes, 5 diaphragmatic breaths; anterior inter-axillary anastamoses, left axillo-inguinal anastamoses. Left upper extremity from fingers and dorsal hand to lateral shoulder redirecting along pathways.  Compression bandaging as follows: lotion, medium TG soft, elastamull to digits 1-5, 1/2 grey foam to olecranon in area of discomfort, artiflex from hand to axilla, 1 6 cm bandage at hand, 1 8 cm bandage from wrist to axilla, 1 10 cm bandage from wrist to axilla.  07/06/23: Removed bandages and assessed pt's independence.  Manual lymph drainage in supine as  follows: short neck, right axillary nodes, left inguinal nodes, 5 diaphragmatic breaths; anterior inter-axillary anastamoses, left axillo-inguinal anastamoses. Left upper extremity from fingers and dorsal hand to lateral shoulder redirecting along pathways.  Compression bandaging as follows: medium TG soft, elastamull to digits 1-5, 1/2 grey foam to olecranon in area of discomfort, artiflex from hand to axilla, 1 6 cm bandage at hand, 1 8 cm bandage from wrist to axilla, 1 10 cm bandage from wrist to axilla.      PATIENT EDUCATION:  Education details: self bandaging, MD Dareen Piano how to bandage your own arm Person educated: Patient Education method: Explanation, Demonstration, Tactile cues, and Verbal cues Education comprehension: verbalized understanding, returned demonstration, verbal cues required, and needs further education  HOME EXERCISE PROGRAM: Practice self bandaging Encouraged AROM, elevation, and distal to proximal milking type massage until next visits.   ASSESSMENT:  CLINICAL IMPRESSION: Pt reports her fingers did not swell so requested we do without wrapping fingers again. She will wrap at home if she notices any swelling. Continue CDT  OBJECTIVE IMPAIRMENTS: decreased activity tolerance, decreased knowledge of condition, decreased knowledge of use of DME, and increased edema.   ACTIVITY LIMITATIONS: none  PARTICIPATION LIMITATIONS: yard work  PERSONAL FACTORS: SLNB are also affecting patient's functional outcome.   REHAB POTENTIAL: Excellent  CLINICAL DECISION MAKING: Stable/uncomplicated  EVALUATION COMPLEXITY: Low  GOALS: Goals reviewed with patient? Yes  SHORT TERM GOALS = LTGs: Target date: 07/23/23  Pt will plateau left UE reduction to be measured for UE compression Baseline: Goal status: MET  2.  Pt will be ind with self MLD for the UE Baseline:  Goal status: MET  3.  Pt will be ind with self bandaging and where to get supplies  Baseline:  Goal  status: MET  4.  Pt will be ind with stretches for any continued tightness Baseline: Pt has been instructed in initial HEP stretches Goal status: ONGOING  PLAN:  PT FREQUENCY: 1-2x/week   PT DURATION: 4 weeks  PLANNED INTERVENTIONS: Therapeutic exercises, Therapeutic activity, Patient/Family education, Self Care, Orthotic/Fit training, Prosthetic training, DME instructions, Manual therapy, and re-eval  PLAN FOR NEXT SESSION: Did pt order garments? Pt would like to decrease to 1-2x/wk until garments arrive as she is doing well with self MLD and self bandaging at home. CDT Lt UE, continue UE stretches  Garments: waiting for sleeve to come.    Waynette Buttery, PT 08/07/2023, 8:55 AM  [facility] [address] [place], [place], [address] Phone: [phone]   Fax:  [phone]

## 2023-08-11 ENCOUNTER — Ambulatory Visit: Payer: Medicare Other | Admitting: Physical Therapy

## 2023-08-13 ENCOUNTER — Ambulatory Visit: Payer: Medicare Other

## 2023-08-14 ENCOUNTER — Ambulatory Visit (AMBULATORY_SURGERY_CENTER): Payer: Medicare Other

## 2023-08-14 ENCOUNTER — Other Ambulatory Visit: Payer: Self-pay

## 2023-08-14 VITALS — Ht 64.0 in | Wt 213.0 lb

## 2023-08-14 DIAGNOSIS — Z1211 Encounter for screening for malignant neoplasm of colon: Secondary | ICD-10-CM

## 2023-08-14 MED ORDER — NA SULFATE-K SULFATE-MG SULF 17.5-3.13-1.6 GM/177ML PO SOLN: 1.0000 | Freq: Once | ORAL | 0 refills | Status: AC

## 2023-08-14 NOTE — Progress Notes (Signed)
Denies allergies to eggs or soy products. Denies complication of anesthesia or sedation. Denies use of weight loss medication. Denies use of O2.   Emmi instructions given for colonoscopy.  

## 2023-08-22 ENCOUNTER — Encounter: Payer: Self-pay | Admitting: Certified Registered Nurse Anesthetist

## 2023-08-25 ENCOUNTER — Encounter: Payer: Self-pay | Admitting: Internal Medicine

## 2023-08-25 ENCOUNTER — Ambulatory Visit (AMBULATORY_SURGERY_CENTER): Payer: Medicare Other | Admitting: Internal Medicine

## 2023-08-25 VITALS — BP 134/83 | HR 67 | Temp 97.8°F | Resp 15 | Ht 64.0 in | Wt 213.0 lb

## 2023-08-25 DIAGNOSIS — D122 Benign neoplasm of ascending colon: Secondary | ICD-10-CM | POA: Diagnosis not present

## 2023-08-25 DIAGNOSIS — D120 Benign neoplasm of cecum: Secondary | ICD-10-CM

## 2023-08-25 DIAGNOSIS — Z1211 Encounter for screening for malignant neoplasm of colon: Secondary | ICD-10-CM | POA: Diagnosis not present

## 2023-08-25 DIAGNOSIS — D123 Benign neoplasm of transverse colon: Secondary | ICD-10-CM

## 2023-08-25 DIAGNOSIS — K635 Polyp of colon: Secondary | ICD-10-CM | POA: Diagnosis not present

## 2023-08-25 MED ORDER — SODIUM CHLORIDE 0.9 % IV SOLN: 500.0000 mL | Freq: Once | INTRAVENOUS | Status: DC

## 2023-08-25 NOTE — Progress Notes (Signed)
Called to room to assist during endoscopic procedure.  Patient ID and intended procedure confirmed with present staff. Received instructions for my participation in the procedure from the performing physician.  

## 2023-08-25 NOTE — Progress Notes (Signed)
Report given to PACU, vss 

## 2023-08-25 NOTE — Op Note (Signed)
[facility] Patient Name: Madison Becker Procedure Date: 08/25/2023 7:12 AM MRN: [id number] Endoscopist: Beverley Fiedler , MD, [id number] Age: 67 Referring MD:  Date of Birth: 03/26/1956 Gender: Female Account #: [id number] Procedure:                Colonoscopy Indications:              Screening for colorectal malignant neoplasm, Last                            colonoscopy: 2012 Medicines:                Monitored Anesthesia Care Procedure:                Pre-Anesthesia Assessment:                           - Prior to the procedure, a History and Physical                            was performed, and patient medications and                            allergies were reviewed. The patient's tolerance of                            previous anesthesia was also reviewed. The risks                            and benefits of the procedure and the sedation                            options and risks were discussed with the patient.                            All questions were answered, and informed consent                            was obtained. Prior Anticoagulants: The patient has                            taken no anticoagulant or antiplatelet agents. ASA                            Grade Assessment: II - A patient with mild systemic                            disease. After reviewing the risks and benefits,                            the patient was deemed in satisfactory condition to                            undergo the procedure.  After obtaining informed consent, the colonoscope                            was passed under direct vision. Throughout the                            procedure, the patient's blood pressure, pulse, and                            oxygen saturations were monitored continuously. The                            CF [id number] #[id number] was introduced through the anus                            and advanced to the cecum, identified by                             appendiceal orifice and ileocecal valve. The                            colonoscopy was performed without difficulty. The                            patient tolerated the procedure well. The quality                            of the bowel preparation was good. The ileocecal                            valve, appendiceal orifice, and rectum were                            photographed. Scope In: 8:10:39 AM Scope Out: 8:27:03 AM Scope Withdrawal Time: 0 hours 12 minutes 45 seconds  Total Procedure Duration: 0 hours 16 minutes 24 seconds  Findings:                 The digital rectal exam was normal.                           A 4 mm polyp was found in the cecum. The polyp was                            sessile. The polyp was removed with a cold snare.                            Resection and retrieval were complete.                           Two sessile polyps were found in the ascending                            colon. The polyps were 4 to 7 mm in  size. These                            polyps were removed with a cold snare. Resection                            and retrieval were complete.                           A 4 mm polyp was found in the transverse colon. The                            polyp was sessile. The polyp was removed with a                            cold snare. Resection and retrieval were complete.                           Multiple large-mouthed, medium-mouthed and                            small-mouthed diverticula were found in the sigmoid                            colon, descending colon, transverse colon and                            ascending colon.                           The retroflexed view of the distal rectum and anal                            verge was normal and showed no anal or rectal                            abnormalities. Complications:            No immediate complications. Estimated Blood Loss:     Estimated blood loss  was minimal. Impression:               - One 4 mm polyp in the cecum, removed with a cold                            snare. Resected and retrieved.                           - Two 4 to 7 mm polyps in the ascending colon,                            removed with a cold snare. Resected and retrieved.                           - One 4 mm polyp in the transverse colon, removed  with a cold snare. Resected and retrieved.                           - Moderate diverticulosis in the sigmoid colon, in                            the descending colon, in the transverse colon and                            in the ascending colon.                           - The distal rectum and anal verge are normal on                            retroflexion view. Recommendation:           - Patient has a contact number available for                            emergencies. The signs and symptoms of potential                            delayed complications were discussed with the                            patient. Return to normal activities tomorrow.                            Written discharge instructions were provided to the                            patient.                           - Resume previous diet.                           - Continue present medications.                           - Await pathology results.                           - Repeat colonoscopy is recommended. The                            colonoscopy date will be determined after pathology                            results from today's exam become available for                            review. Beverley Fiedler, MD 08/25/2023 8:38:46 AM This report has been signed electronically.

## 2023-08-25 NOTE — Progress Notes (Signed)
GASTROENTEROLOGY PROCEDURE H&P NOTE   Primary Care Physician: Pcp, No    Reason for Procedure:   Crc screening  Plan:    colonoscopy  Patient is appropriate for endoscopic procedure(s) in the ambulatory ([facility]) setting.  The nature of the procedure, as well as the risks, benefits, and alternatives were carefully and thoroughly reviewed with the patient. Ample time for discussion and questions allowed. The patient understood, was satisfied, and agreed to proceed.     HPI: Madison Becker is a 67 y.o. female who presents for colonoscopy.  Medical history as below.  Tolerated the prep.  No recent chest pain or shortness of breath.  No abdominal pain today.  Past Medical History:  Diagnosis Date   Allergy    Anemia    Arthritis    Breast cancer (HCC) 09/2021   Depression    GERD (gastroesophageal reflux disease)    History of cervical dysplasia    Hyperlipidemia    Hypertension    Neuromuscular disorder (HCC)    Port-A-Cath in place 04/09/2022    Past Surgical History:  Procedure Laterality Date   IR IMAGING GUIDED PORT INSERTION  04/09/2022   LEEP  2008   MASTECTOMY W/ SENTINEL NODE BIOPSY Left 03/05/2022   Procedure: LEFT MASTECTOMY WITH SENTINEL LYMPH NODE BIOPSY;  Surgeon: Abigail Miyamoto, MD;  Location: [facility];  Service: General;  Laterality: Left;   PORT-A-CATH REMOVAL Right 10/23/2022   Procedure: REMOVAL PORT-A-CATH;  Surgeon: Abigail Miyamoto, MD;  Location: [facility];  Service: General;  Laterality: Right;    Prior to Admission medications   Medication Sig Start Date End Date Taking? Authorizing Provider  anastrozole (ARIMIDEX) 1 MG tablet Take 1 tablet (1 mg total) by mouth daily. 06/08/23  Yes Serena Croissant, MD  hydrochlorothiazide (HYDRODIURIL) 25 MG tablet Take 1 tablet (25 mg total) by mouth daily. 06/08/23  Yes Serena Croissant, MD  Multiple Vitamin (MULTIVITAMIN) capsule Take 1 capsule by mouth daily.   Yes [provider]  Multiple Vitamin (MULTIVITAMIN) capsule Take 1 capsule by mouth daily.   Yes [provider]  OVER THE COUNTER MEDICATION B 12 one tablet every other day.   Yes [provider]  VITAMIN D PO Take by mouth.   Yes [provider]  acetaminophen (TYLENOL) 500 MG tablet Take 1,000 mg by mouth every 6 (six) hours as needed for mild pain.    [provider]    Current Outpatient Medications  Medication Sig Dispense Refill   anastrozole (ARIMIDEX) 1 MG tablet Take 1 tablet (1 mg total) by mouth daily. 90 tablet 3   hydrochlorothiazide (HYDRODIURIL) 25 MG tablet Take 1 tablet (25 mg total) by mouth daily.     Multiple Vitamin (MULTIVITAMIN) capsule Take 1 capsule by mouth daily.     Multiple Vitamin (MULTIVITAMIN) capsule Take 1 capsule by mouth daily.     OVER THE COUNTER MEDICATION B 12 one tablet every other day.     VITAMIN D PO Take by mouth.     acetaminophen (TYLENOL) 500 MG tablet Take 1,000 mg by mouth every 6 (six) hours as needed for mild pain.     Current Facility-Administered Medications  Medication Dose Route Frequency Provider Last Rate Last Admin   0.9 %  sodium chloride infusion  500 mL Intravenous Once Jared Whorley, Carie Caddy, MD        Allergies as of 08/25/2023 - Review Complete 08/25/2023  Allergen Reaction Noted   Beef-derived products  05/08/2022   Pork-derived products  05/08/2022    Family History  Problem Relation Age of Onset   Hypertension Mother    Deep vein thrombosis Mother    Coronary artery disease Father    Breast cancer Maternal Aunt    Colon cancer Neg Hx    Esophageal cancer Neg Hx    Stomach cancer Neg Hx    Rectal cancer Neg Hx     Social History   Socioeconomic History   Marital status: Single    Spouse name: Not on file   Number of children: Not on file   Years of education: Not on file   Highest education level: Not on file  Occupational History   Not on file  Tobacco Use   Smoking status:  Former    Types: Cigarettes   Smokeless tobacco: Never   Tobacco comments:    Occasional smoker, not consistent.  Vaping Use   Vaping status: Never Used  Substance and Sexual Activity   Alcohol use: Yes    Comment: occ wine   Drug use: Never   Sexual activity: Not on file  Other Topics Concern   Not on file  Social History Narrative   Not on file   Social Determinants of Health   Financial Resource Strain: Not on file  Food Insecurity: No Food Insecurity (01/08/2023)   Hunger Vital Sign    Worried About Running Out of Food in the Last Year: Never true    Ran Out of Food in the Last Year: Never true  Transportation Needs: No Transportation Needs (01/08/2023)   PRAPARE - Administrator, Civil Service (Medical): No    Lack of Transportation (Non-Medical): No  Physical Activity: Not on file  Stress: Not on file  Social Connections: Not on file  Intimate Partner Violence: Not At Risk (10/22/2021)   Humiliation, Afraid, Rape, and Kick questionnaire    Fear of Current or Ex-Partner: No    Emotionally Abused: No    Physically Abused: No    Sexually Abused: No    Physical Exam: Vital signs in last 24 hours: @BP  (!) 157/81   Pulse 69   Temp 97.8 F (36.6 C)   Resp 12   Ht 5\' 4"  (1.626 m)   Wt 213 lb (96.6 kg)   SpO2 100%   BMI 36.56 kg/m  GEN: NAD EYE: Sclerae anicteric ENT: MMM CV: Non-tachycardic Pulm: CTA b/l GI: Soft, NT/ND NEURO:  Alert & Oriented x 3   Erick Blinks, MD [facility]  08/25/2023 8:05 AM

## 2023-08-25 NOTE — Patient Instructions (Addendum)
4 polyps removed and sent to pathology.  Handouts on findings given to patient.  (Diverticulosis, polyps)                           - Resume previous diet.                           - Continue present medications.                           - Await pathology results.                           - Repeat colonoscopy is recommended. The                            colonoscopy date will be determined after pathology                            results from today's exam become available for                            review.   YOU HAD AN ENDOSCOPIC PROCEDURE TODAY AT THE [facility]:   Refer to the procedure report that was given to you for any specific questions about what was found during the examination.  If the procedure report does not answer your questions, please call your gastroenterologist to clarify.  If you requested that your care partner not be given the details of your procedure findings, then the procedure report has been included in a sealed envelope for you to review at your convenience later.  YOU SHOULD EXPECT: Some feelings of bloating in the abdomen. Passage of more gas than usual.  Walking can help get rid of the air that was put into your GI tract during the procedure and reduce the bloating. If you had a lower endoscopy (such as a colonoscopy or flexible sigmoidoscopy) you may notice spotting of blood in your stool or on the toilet paper. If you underwent a bowel prep for your procedure, you may not have a normal bowel movement for a few days.  Please Note:  You might notice some irritation and congestion in your nose or some drainage.  This is from the oxygen used during your procedure.  There is no need for concern and it should clear up in a day or so.  SYMPTOMS TO REPORT IMMEDIATELY:  Following lower endoscopy (colonoscopy or flexible sigmoidoscopy):  Excessive amounts of blood in the stool  Significant tenderness or worsening of abdominal pains  Swelling of the  abdomen that is new, acute  Fever of 100F or higher   For urgent or emergent issues, a gastroenterologist can be reached at any hour by calling [phone]. Do not use MyChart messaging for urgent concerns.    DIET:  We do recommend a small meal at first, but then you may proceed to your regular diet.  Drink plenty of fluids but you should avoid alcoholic beverages for 24 hours.  ACTIVITY:  You should plan to take it easy for the rest of today and you should NOT DRIVE or use heavy machinery until tomorrow (because of the sedation medicines used during the test).  FOLLOW UP: Our staff will call the number listed on your records the next business day following your procedure.  We will call around 7:15- 8:00 am to check on you and address any questions or concerns that you may have regarding the information given to you following your procedure. If we do not reach you, we will leave a message.     If any biopsies were taken you will be contacted by phone or by letter within the next 1-3 weeks.  Please call us at [phone] if you have not heard about the biopsies in 3 weeks.    SIGNATURES/CONFIDENTIALITY: You and/or your care partner have signed paperwork which will be entered into your electronic medical record.  These signatures attest to the fact that that the information above on your After Visit Summary has been reviewed and is understood.  Full responsibility of the confidentiality of this discharge information lies with you and/or your care-partner.

## 2023-08-25 NOTE — Progress Notes (Signed)
Pt's states no medical or surgical changes since previsit or office visit. 

## 2023-08-26 ENCOUNTER — Telehealth: Payer: Self-pay

## 2023-08-26 NOTE — Telephone Encounter (Signed)
  Follow up Call-     08/25/2023    7:22 AM  Call back number  Post procedure Call Back phone  # [phone]  Permission to leave phone message Yes     Patient questions:  Do you have a fever, pain , or abdominal swelling? No. Pain Score  0 *  Have you tolerated food without any problems? Yes.    Have you been able to return to your normal activities? Yes.    Do you have any questions about your discharge instructions: Diet   No. Medications  No. Follow up visit  No.  Do you have questions or concerns about your Care? No.  Actions: * If pain score is 4 or above: No action needed, pain <4.

## 2023-08-27 ENCOUNTER — Encounter: Payer: Self-pay | Admitting: Internal Medicine

## 2023-08-31 ENCOUNTER — Ambulatory Visit: Payer: Medicare Other | Admitting: Physical Therapy

## 2023-09-04 ENCOUNTER — Other Ambulatory Visit: Payer: Self-pay

## 2023-09-04 DIAGNOSIS — Z170 Estrogen receptor positive status [ER+]: Secondary | ICD-10-CM

## 2023-10-02 LAB — SIGNATERA ONLY (NATERA MANAGED)
SIGNATERA MTM READOUT: 0 MTM/ml
SIGNATERA TEST RESULT: NEGATIVE

## 2023-10-07 ENCOUNTER — Telehealth: Payer: Self-pay

## 2023-10-07 NOTE — Telephone Encounter (Signed)
Attempted to call pt regarding signatera results lvm for pt to return call back.  Signetara results were negative.

## 2023-12-08 ENCOUNTER — Ambulatory Visit (HOSPITAL_COMMUNITY)
Admission: EM | Admit: 2023-12-08 | Discharge: 2023-12-08 | Disposition: A | Discharge status: Home / Self Care | Payer: BC Managed Care – PPO

## 2023-12-08 ENCOUNTER — Encounter (HOSPITAL_COMMUNITY): Payer: Self-pay

## 2023-12-08 DIAGNOSIS — J069 Acute upper respiratory infection, unspecified: Secondary | ICD-10-CM

## 2023-12-08 NOTE — ED Provider Notes (Signed)
[facility]    CSN: [id number] Arrival date & time: 12/08/23  1328      History   Chief Complaint Chief Complaint  Patient presents with   Cough    HPI Madison Becker is a 67 y.o. female.   Patient here c/w cough x 3 days.  Co-worker sick with similar sx.  She reports nasal congestion, rhinorrhea, sore throat. Fever of 100.0 yesterday.  Taking mucinex with relief.  Cough is productive.  She states she had burning in her chest when sx started, but that's resolved.    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      Home Medications    Prior to Admission medications   Medication Sig Start Date End Date Taking? Authorizing Provider  acetaminophen (TYLENOL) 500 MG tablet Take 1,000 mg by mouth every 6 (six) hours as needed for mild pain.    [provider]  anastrozole  (ARIMIDEX) 1 MG tablet Take 1 tablet (1 mg total) by mouth daily. 06/08/23   Serena Croissant, MD  hydrochlorothiazide (HYDRODIURIL) 25 MG tablet Take 1 tablet (25 mg total) by mouth daily. 06/08/23   Serena Croissant, MD  Multiple Vitamin (MULTIVITAMIN) capsule Take 1 capsule by mouth daily.    [provider]  Multiple Vitamin (MULTIVITAMIN) capsule Take 1 capsule by mouth daily.    [provider]  OVER THE COUNTER MEDICATION B 12 one tablet every other day.    [provider]  VITAMIN D PO Take by mouth.    [provider]    Family History Family History  Problem Relation Age of Onset   Hypertension Mother    Deep vein thrombosis Mother    Coronary artery disease Father    Breast cancer Maternal Aunt    Colon cancer Neg Hx    Esophageal cancer Neg Hx    Stomach cancer Neg Hx    Rectal cancer Neg Hx     Social History Social History   Tobacco Use   Smoking status: Former    Types: Cigarettes   Smokeless tobacco: Never   Tobacco comments:    Occasional smoker, not consistent.  Vaping Use   Vaping status: Never Used  Substance Use Topics   Alcohol use: Yes    Comment: occ wine   Drug use: Never  Allergies   Beef-derived drug products and Pork-derived products   Review of Systems Review of Systems  Constitutional:  Positive for fatigue. Negative for chills and fever.  HENT:  Positive for congestion and rhinorrhea. Negative for ear pain, nosebleeds, postnasal drip, sinus pressure, sinus pain and sore throat.   Eyes:  Negative for pain and redness.  Respiratory:  Positive for cough. Negative for chest tightness, shortness of breath and wheezing.   Gastrointestinal:  Negative for abdominal pain, diarrhea, nausea and vomiting.  Musculoskeletal:  Negative for arthralgias and myalgias.  Skin:  Negative for rash.  Neurological:  Positive for headaches. Negative for light-headedness.  Hematological:  Negative for adenopathy. Does not  bruise/bleed easily.  Psychiatric/Behavioral:  Negative for confusion and sleep disturbance.      Physical Exam Triage Vital Signs ED Triage Vitals [12/08/23 1509]  Encounter Vitals Group     BP 136/82     Systolic BP Percentile      Diastolic BP Percentile      Pulse Rate 68     Resp 16     Temp 99 F (37.2 C)     Temp Source Oral     SpO2 96 %     Weight 230 lb (104.3 kg)     Height 5\' 4"  (1.626 m)     Head Circumference      Peak Flow      Pain Score 0     Pain Loc      Pain Education      Exclude from Growth Chart    No data found.  Updated Vital Signs BP 136/82 (BP Location: Right Arm)   Pulse 68   Temp 99 F (37.2 C) (Oral)   Resp 16   Ht 5\' 4"  (1.626 m)   Wt 230 lb (104.3 kg)   SpO2 96%   BMI 39.48 kg/m   Visual Acuity Right Eye Distance:   Left Eye Distance:   Bilateral Distance:    Right Eye Near:   Left Eye Near:    Bilateral Near:     Physical Exam Vitals and nursing note reviewed.  Constitutional:      General: She is not in acute distress.    Appearance: Normal appearance. She is not ill-appearing.  HENT:     Head: Normocephalic and atraumatic.     Right Ear: Tympanic membrane and ear canal normal.     Left Ear: Tympanic membrane and ear canal normal.     Nose: Congestion and rhinorrhea present. No mucosal edema. Rhinorrhea is clear.     Comments: Erythematous nasal passage    Mouth/Throat:     Pharynx: Uvula midline. Posterior oropharyngeal erythema present. No pharyngeal swelling or oropharyngeal exudate.     Tonsils: No tonsillar exudate or tonsillar abscesses.  Eyes:     General: No scleral icterus.    Extraocular Movements: Extraocular movements intact.     Conjunctiva/sclera: Conjunctivae normal.  Cardiovascular:     Rate and Rhythm: Normal rate and regular rhythm.     Heart sounds: No murmur heard. Pulmonary:     Effort: Pulmonary effort is normal. No respiratory distress.     Breath sounds: Normal breath sounds. No wheezing  or rales.  Chest:     Chest wall: No tenderness.  Musculoskeletal:     Cervical back: Normal range of motion. No rigidity.  Lymphadenopathy:     Cervical: No cervical adenopathy.  Skin:    Coloration: Skin is not jaundiced.  Findings: No rash.  Neurological:     General: No focal deficit present.     Mental Status: She is alert and oriented to person, place, and time.     Motor: No weakness.     Gait: Gait normal.  Psychiatric:        Mood and Affect: Mood normal.        Behavior: Behavior normal.      UC Treatments / Results  Labs (all labs ordered are listed, but only abnormal results are displayed) Labs Reviewed - No data to display  EKG   Radiology No results found.  Procedures Procedures (including critical care time)  Medications Ordered in UC Medications - No data to display  Initial Impression / Assessment and Plan / UC Course  I have reviewed the triage vital signs and the nursing notes.  Pertinent labs & imaging results that were available during my care of the patient were reviewed by me and considered in my medical decision making (see chart for details).     Return PRN Continue mucinex  Final Clinical Impressions(s) / UC Diagnoses   Final diagnoses:  Viral URI with cough     Discharge Instructions      Return if no improvement or worsening symptoms   ED Prescriptions   None    PDMP not reviewed this encounter.   Evern Core, PA-C 12/08/23 1600

## 2023-12-08 NOTE — Discharge Instructions (Addendum)
Return if no improvement or worsening symptoms.

## 2023-12-08 NOTE — ED Triage Notes (Signed)
Patient here today with c/o productive cough, ST, burning in chest, fatigue, and HA since Sunday. Denies nasal congestion. Patient took Theraflu and Tylenol with some relief. Coworker was sick on Friday.

## 2023-12-18 ENCOUNTER — Encounter (HOSPITAL_COMMUNITY): Payer: Self-pay

## 2023-12-18 ENCOUNTER — Emergency Department (HOSPITAL_BASED_OUTPATIENT_CLINIC_OR_DEPARTMENT_OTHER): Payer: Medicare Other

## 2023-12-18 ENCOUNTER — Other Ambulatory Visit: Payer: Self-pay

## 2023-12-18 ENCOUNTER — Emergency Department (HOSPITAL_COMMUNITY)
Admission: EM | Admit: 2023-12-18 | Discharge: 2023-12-18 | Disposition: A | Discharge status: Home / Self Care | Payer: Medicare Other | Attending: Emergency Medicine | Admitting: Emergency Medicine

## 2023-12-18 ENCOUNTER — Other Ambulatory Visit (HOSPITAL_BASED_OUTPATIENT_CLINIC_OR_DEPARTMENT_OTHER): Payer: Self-pay

## 2023-12-18 ENCOUNTER — Other Ambulatory Visit (HOSPITAL_COMMUNITY): Payer: Self-pay

## 2023-12-18 DIAGNOSIS — M79662 Pain in left lower leg: Secondary | ICD-10-CM | POA: Diagnosis not present

## 2023-12-18 DIAGNOSIS — I824Y2 Acute embolism and thrombosis of unspecified deep veins of left proximal lower extremity: Secondary | ICD-10-CM | POA: Insufficient documentation

## 2023-12-18 DIAGNOSIS — R2242 Localized swelling, mass and lump, left lower limb: Secondary | ICD-10-CM | POA: Diagnosis present

## 2023-12-18 DIAGNOSIS — Z7901 Long term (current) use of anticoagulants: Secondary | ICD-10-CM | POA: Diagnosis not present

## 2023-12-18 MED ORDER — RIVAROXABAN (XARELTO) VTE STARTER PACK (15 & 20 MG)
ORAL_TABLET | ORAL | 0 refills | Status: DC
  Filled 2023-12-18: qty 51, 28d supply, fill #0

## 2023-12-18 MED ORDER — RIVAROXABAN 15 MG PO TABS: 15.0000 mg | ORAL_TABLET | Freq: Once | ORAL | Status: DC

## 2023-12-18 MED ORDER — RIVAROXABAN (XARELTO) EDUCATION KIT FOR DVT/PE PATIENTS
PACK | Freq: Once | Status: DC
  Filled 2023-12-18: qty 1

## 2023-12-18 NOTE — ED Triage Notes (Signed)
Pt arrives via POV. Pt reports pain, swelling, and increased warmth to left lower leg for the past 3 days. Pt is concerned she may have a dvt. Pt is AxOx4. Denies any other symptoms. No obvious sign of infection to left leg.

## 2023-12-18 NOTE — ED Provider Notes (Signed)
[place] EMERGENCY DEPARTMENT AT [facility] Provider Note   CSN: [id number] Arrival date & time: 12/18/23  8657     History Chief Complaint  Patient presents with   Leg Pain    HPI Madison Becker is a 67 y.o. female presenting for chief complaint of leg pain.  This is a 67 year old female with a minimal medical history.  Family history of DVTs.  Stated that 3 days ago her left calf started swelling up and becoming painful.  Today it has continued.  Comes in for further care management.  Denies fevers chills nausea vomiting shortness of breath or any injury.  No recent surgery..   Patient's recorded medical, surgical, social, medication list and allergies were reviewed in the Snapshot window as part of the initial history.   Review of Systems   Review of Systems  Constitutional:  Negative for chills and fever.  HENT:  Negative for ear pain and sore throat.   Eyes:  Negative for pain and visual disturbance.  Respiratory:  Negative for cough and shortness of breath.   Cardiovascular:  Positive for leg swelling. Negative for chest pain and palpitations.  Gastrointestinal:  Negative for abdominal pain and vomiting.  Genitourinary:  Negative for dysuria and hematuria.  Musculoskeletal:  Negative for arthralgias and back pain.  Skin:  Negative for color change and rash.  Neurological:  Negative for seizures and syncope.  All other systems reviewed and are negative.   Physical Exam Updated Vital Signs BP (!) 131/96 (BP Location: Right Arm)   Pulse 92   Temp 98.4 F (36.9 C)   Resp 16   Ht 5\' 4"  (1.626 m)   Wt 104.3 kg   SpO2 100%   BMI 39.48 kg/m  Physical Exam Vitals and nursing note reviewed.  Constitutional:      General: She is not in acute distress.    Appearance: She is well-developed.  HENT:     Head: Normocephalic and atraumatic.  Eyes:     Conjunctiva/sclera: Conjunctivae normal.  Cardiovascular:     Rate and Rhythm: Normal rate and regular  rhythm.     Heart sounds: No murmur heard. Pulmonary:     Effort: Pulmonary effort is normal. No respiratory distress.     Breath sounds: Normal breath sounds.  Abdominal:     General: There is no distension.     Palpations: Abdomen is soft.     Tenderness: There is no abdominal tenderness. There is no right CVA tenderness or left CVA tenderness.  Musculoskeletal:        General: No swelling or tenderness. Normal range of motion.     Cervical back: Neck supple.     Left lower leg: Edema present.  Skin:    General: Skin is warm and dry.  Neurological:     General: No focal deficit present.     Mental Status: She is alert and oriented to person, place, and time. Mental status is at baseline.     Cranial Nerves: No cranial nerve deficit.      ED Course/ Medical Decision Making/ A&P    Procedures Procedures   Medications Ordered in ED Medications  Rivaroxaban (XARELTO) tablet 15 mg (has no administration in time range)  rivaroxaban (XARELTO) Education Kit for DVT/PE patients (has no administration in time range)    Medical Decision Making:   67 year old female with left lower extremity swelling.  Very mild edema compared to the right side.  Neurovascularly intact otherwise.  No provocative  factors for DVT but given family history and findings on exam we will proceed with DVT study.  May just be simple dependent edema but will follow-up study for reassessment and plan. Reassessment: DVT study showed left lower extremity DVT.  Right leg is without swelling or symptoms therefore unlikely to ascend above lower extremity based on current exam. Will start on Xarelto for treatment and referred to DVT clinic for reassessment within 72 hours.  Disposition:  I have considered need for hospitalization, however, considering all of the above, I believe this patient is stable for discharge at this time.  Patient/family educated about specific return precautions for given chief complaint and  symptoms.  Patient/family educated about follow-up with PCP and DVT Clinic.     Patient/family expressed understanding of return precautions and need for follow-up. Patient spoken to regarding all imaging and laboratory results and appropriate follow up for these results. All education provided in verbal form with additional information in written form. Time was allowed for answering of patient questions. Patient discharged.    Emergency Department Medication Summary:   Medications  Rivaroxaban (XARELTO) tablet 15 mg (has no administration in time range)  rivaroxaban (XARELTO) Education Kit for DVT/PE patients (has no administration in time range)         Clinical Impression:  1. Acute deep vein thrombosis (DVT) of proximal vein of left lower extremity [facility])      Discharge   Final Clinical Impression(s) / ED Diagnoses Final diagnoses:  Acute deep vein thrombosis (DVT) of proximal vein of left lower extremity (HCC)    Rx / DC Orders ED Discharge Orders          Ordered    RIVAROXABAN (XARELTO) VTE STARTER PACK (15 & 20 MG)        12/18/23 0909    AMB Referral to Deep Vein Thrombosis Clinic       Comments: Please call [phone] with any questions.   12/18/23 0981              Glyn Ade, MD 12/18/23 [phone]

## 2023-12-18 NOTE — Progress Notes (Signed)
Left lower extremity venous duplex has been completed. Preliminary results can be found in CV Proc through chart review.  Results were given to Luther Hearing PA.  12/18/23 9:07 AM Olen Cordial RVT

## 2023-12-21 ENCOUNTER — Telehealth: Payer: Self-pay

## 2023-12-21 NOTE — Telephone Encounter (Signed)
Called pt. Per Shawna Orleans to follow-up with questions. Pt had some concerns about being on blood thinners and still doing blood draw. Pt verbalize understanding that she could. Pt also wanted to know if [facility] took her new insurance which was [employer]. Pt knows that she will receive a phone call back to confirm.

## 2023-12-25 ENCOUNTER — Other Ambulatory Visit (HOSPITAL_BASED_OUTPATIENT_CLINIC_OR_DEPARTMENT_OTHER): Payer: Self-pay

## 2023-12-25 ENCOUNTER — Other Ambulatory Visit (HOSPITAL_COMMUNITY): Payer: Self-pay

## 2023-12-25 ENCOUNTER — Ambulatory Visit (HOSPITAL_COMMUNITY)
Admission: RE | Admit: 2023-12-25 | Discharge: 2023-12-25 | Disposition: A | Discharge status: Home / Self Care | Payer: BC Managed Care – PPO | Source: Ambulatory Visit | Attending: Vascular Surgery | Admitting: Vascular Surgery

## 2023-12-25 VITALS — BP 139/90 | HR 78

## 2023-12-25 DIAGNOSIS — I82412 Acute embolism and thrombosis of left femoral vein: Secondary | ICD-10-CM | POA: Diagnosis not present

## 2023-12-25 LAB — COMPREHENSIVE METABOLIC PANEL
ALT: 16 U/L (ref 0–44)
AST: 15 U/L (ref 15–41)
Albumin: 3.3 g/dL — ABNORMAL LOW (ref 3.5–5.0)
Alkaline Phosphatase: 63 U/L (ref 38–126)
Anion gap: 8 (ref 5–15)
BUN: 10 mg/dL (ref 8–23)
CO2: 28 mmol/L (ref 22–32)
Calcium: 9.5 mg/dL (ref 8.9–10.3)
Chloride: 101 mmol/L (ref 98–111)
Creatinine, Ser: 0.92 mg/dL (ref 0.44–1.00)
GFR, Estimated: >60 mL/min (ref >60)
Glucose, Bld: 93 mg/dL (ref 70–99)
Potassium: 4.2 mmol/L (ref 3.5–5.1)
Sodium: 137 mmol/L (ref 135–145)
Total Bilirubin: 0.7 mg/dL (ref 0.0–1.2)
Total Protein: 7.3 g/dL (ref 6.5–8.1)

## 2023-12-25 LAB — CBC
HCT: 34 % — ABNORMAL LOW (ref 36.0–46.0)
Hemoglobin: 11.1 g/dL — ABNORMAL LOW (ref 12.0–15.0)
MCH: 28.2 pg (ref 26.0–34.0)
MCHC: 32.6 g/dL (ref 30.0–36.0)
MCV: 86.3 fL (ref 80.0–100.0)
Platelets: 173 10*3/uL (ref 150–400)
RBC: 3.94 MIL/uL (ref 3.87–5.11)
RDW: 13.9 % (ref 11.5–15.5)
WBC: 4.2 10*3/uL (ref 4.0–10.5)
nRBC: 0 % (ref 0.0–0.2)

## 2023-12-25 MED ORDER — RIVAROXABAN 20 MG PO TABS
20.0000 mg | ORAL_TABLET | Freq: Every day | ORAL | 5 refills | Status: DC
  Filled 2023-12-25 – 2024-01-13 (×2): qty 30, 30d supply, fill #0
  Filled 2024-04-28: qty 30, 30d supply, fill #1

## 2023-12-25 NOTE — Progress Notes (Addendum)
 DVT Clinic Note  Name: Madison Becker     MRN: [id number]     DOB: 1955/12/29     Sex: female  PCP: Ilah Crigler, MD  Today's Visit: Visit Information: Initial Visit  Referred to DVT Clinic by: Emergency Department - Madison Becker Referred to CPP by: Dr. Pearline Reason for referral:  Chief Complaint  Patient presents with   Med Management - DVT   HISTORY OF PRESENT ILLNESS: Madison Becker is a 68 y.o. female who presents after diagnosis of DVT for medication management. PMH includes HTN, HLD, and hx of L breast cx s/p mastectomy and adjuvant chemotherapy (2023). Patient was seen in the ED on 12/18/23 with 3 days of L calf cramping and pain. Found to have an acute femoropopliteal DVT in the LLE. Patient was initiated on rivaroxaban  (Xarelto ). Patient was seen in UC on 12/08/23 for a viral URI - reports she was bedridden for 3 days. Was not tested for COVID-19.   Today, pt reports that pain has improved since starting Xarelto . She reports that swelling started a few days after starting rivaroxaban . She has not been using compression stockings or elevating her leg. Endorses family history of DVTs in her mother (in the setting of frequent hospitalizations), aunts, and cousins. No personal hx of DVT. No bruising or bleeding. Reports missed dose of Xarelto  this AM, but otherwise adherent. Pt has had a recent colonoscopy and needs to schedule a mammogram.   Positive Thrombotic Risk Factors: Bed rest >72 hours within 3 month, Obesity, Older Age, Other (comment) (Patient taking aromatase inhibitor) Bleeding Risk Factors: Anticoagulant therapy  Negative Thrombotic Risk Factors: Previous VTE, Recent surgery (within 3 months), Recent trauma (within 3 months), Recent admission to hospital with acute illness (within 3 months), Paralysis, paresis, or recent plaster cast immobilization of lower extremity, Central venous catheterization, Sedentary journey lasting >8 hours within 4 weeks, Pregnancy, Within 6  weeks postpartum, Recent cesarean section (within 3 months), Testosterone therapy, Erythropoiesis-stimulating agent, Active cancer, Smoking, Known thrombophilic condition, Estrogen therapy  Rx Insurance Coverage: Commercial Rx Affordability: $109 for 30ds (patient has $10 copay card) Preferred Pharmacy: [facility] at [facility]  Past Medical History:  Diagnosis Date   Allergy    Anemia    Arthritis    Breast cancer (HCC) 09/2021   Depression    GERD (gastroesophageal reflux disease)    History of cervical dysplasia    Hyperlipidemia    Hypertension    Neuromuscular disorder (HCC)    Port-A-Cath in place 04/09/2022    Past Surgical History:  Procedure Laterality Date   IR IMAGING GUIDED PORT INSERTION  04/09/2022   LEEP  2008   MASTECTOMY W/ SENTINEL NODE BIOPSY Left 03/05/2022   Procedure: LEFT MASTECTOMY WITH SENTINEL LYMPH NODE BIOPSY;  Surgeon: Vernetta Berg, MD;  Location: [facility];  Service: General;  Laterality: Left;   PORT-A-CATH REMOVAL Right 10/23/2022   Procedure: REMOVAL PORT-A-CATH;  Surgeon: Vernetta Berg, MD;  Location: [facility];  Service: General;  Laterality: Right;    Social History   Socioeconomic History   Marital status: Single    Spouse name: Not on file   Number of children: Not on file   Years of education: Not on file   Highest education level: Not on file  Occupational History   Not on file  Tobacco Use   Smoking status: Former    Types: Cigarettes   Smokeless tobacco: Never   Tobacco comments:    Occasional  smoker, not consistent.  Vaping Use   Vaping status: Never Used  Substance and Sexual Activity   Alcohol use: Yes    Comment: occ wine   Drug use: Never   Sexual activity: Not on file  Other Topics Concern   Not on file  Social History Narrative   Not on file   Social Drivers of Health   Financial Resource Strain: Not on file  Food Insecurity: No Food Insecurity  (01/08/2023)   Hunger Vital Sign    Worried About Running Out of Food in the Last Year: Never true    Ran Out of Food in the Last Year: Never true  Transportation Needs: No Transportation Needs (01/08/2023)   PRAPARE - Administrator, Civil Service (Medical): No    Lack of Transportation (Non-Medical): No  Physical Activity: Not on file  Stress: Not on file  Social Connections: Not on file  Intimate Partner Violence: Not At Risk (10/22/2021)   Humiliation, Afraid, Rape, and Kick questionnaire    Fear of Current or Ex-Partner: No    Emotionally Abused: No    Physically Abused: No    Sexually Abused: No    Family History  Problem Relation Age of Onset   Hypertension Mother    Deep vein thrombosis Mother    Coronary artery disease Father    Breast cancer Maternal Aunt    Colon cancer Neg Hx    Esophageal cancer Neg Hx    Stomach cancer Neg Hx    Rectal cancer Neg Hx     Allergies as of 12/25/2023 - Review Complete 12/25/2023  Allergen Reaction Noted   Beef-derived drug products  05/08/2022   Pork-derived products  05/08/2022    Current Outpatient Medications on File Prior to Encounter  Medication Sig Dispense Refill   acetaminophen  (TYLENOL ) 500 MG tablet Take 1,000 mg by mouth every 6 (six) hours as needed for mild pain.     anastrozole  (ARIMIDEX ) 1 MG tablet Take 1 tablet (1 mg total) by mouth daily. 90 tablet 3   hydrochlorothiazide  (HYDRODIURIL ) 25 MG tablet Take 1 tablet (25 mg total) by mouth daily.     Multiple Vitamin (MULTIVITAMIN) capsule Take 1 capsule by mouth daily.     OVER THE COUNTER MEDICATION B 12 one tablet every other day.     RIVAROXABAN  (XARELTO ) VTE STARTER PACK (15 & 20 MG) Follow package directions: Take one 15mg  tablet by mouth twice a day. On day 22, switch to one 20mg  tablet once a day. Take with food. 51 each 0   VITAMIN D PO Take by mouth.     Multiple Vitamin (MULTIVITAMIN) capsule Take 1 capsule by mouth daily.     No current  facility-administered medications on file prior to encounter.   REVIEW OF SYSTEMS:  Review of Systems  Respiratory:  Negative for shortness of breath.   Cardiovascular:  Negative for chest pain, palpitations and leg swelling.  Musculoskeletal:  Negative for myalgias.  Neurological:  Positive for tingling. Negative for dizziness.   PHYSICAL EXAMINATION:  Vitals:   12/25/23 1422  BP: (!) 139/90  Pulse: 78  SpO2: 100%   Physical Exam Vitals reviewed.  Cardiovascular:     Rate and Rhythm: Normal rate.  Pulmonary:     Effort: Pulmonary effort is normal.  Musculoskeletal:        General: No tenderness.     Right lower leg: No edema.     Left lower leg: Edema present.  Skin:  Findings: Erythema present. No bruising.  Psychiatric:        Mood and Affect: Mood normal.        Behavior: Behavior normal.        Thought Content: Thought content normal.   Villalta Score for Post-Thrombotic Syndrome: Pain: Mild Cramps: Mild Heaviness: Moderate Paresthesia: Moderate Pruritus: Mild Pretibial Edema: Moderate Skin Induration: Absent Hyperpigmentation: Absent Redness: Mild Venous Ectasia: Absent Pain on calf compression: Absent Villalta Preliminary Score: 10 Is venous ulcer present?: No If venous ulcer is present and score is <15, then 15 points total are assigned: Absent Villalta Total Score: 10  LABS:  CBC     Component Value Date/Time   WBC 4.2 12/25/2023 1420   RBC 3.94 12/25/2023 1420   HGB 11.1 (L) 12/25/2023 1420   HGB 9.6 (L) 09/04/2022 1146   HCT 34.0 (L) 12/25/2023 1420   PLT 173 12/25/2023 1420   PLT 142 (L) 09/04/2022 1146   MCV 86.3 12/25/2023 1420   MCH 28.2 12/25/2023 1420   MCHC 32.6 12/25/2023 1420   RDW 13.9 12/25/2023 1420   LYMPHSABS 0.7 09/04/2022 1146   MONOABS 0.4 09/04/2022 1146   EOSABS 0.1 09/04/2022 1146   BASOSABS 0.0 09/04/2022 1146    Hepatic Function      Component Value Date/Time   PROT 7.3 12/25/2023 1420   ALBUMIN 3.3 (L)  12/25/2023 1420   AST 15 12/25/2023 1420   AST 17 09/04/2022 1146   ALT 16 12/25/2023 1420   ALT 17 09/04/2022 1146   ALKPHOS 63 12/25/2023 1420   BILITOT 0.7 12/25/2023 1420   BILITOT 0.5 09/04/2022 1146    Renal Function   Lab Results  Component Value Date   CREATININE 0.92 12/25/2023   CREATININE 0.86 09/04/2022   CREATININE 0.90 08/28/2022    Estimated Creatinine Clearance: 71.7 mL/min (by C-G formula based on SCr of 0.92 mg/dL).   [facility] Vascular Lab Studies:  12/18/23 VAS US  LOWER EXTREMITY VENOUS LEFT (DVT)  Summary:  RIGHT:  - No evidence of common femoral vein obstruction.   LEFT:  - Findings consistent with acute deep vein thrombosis involving the left  femoral vein, left popliteal vein, and left posterior tibial veins.  Findings consistent with acute intramuscular thrombosis involving the left  gastrocnemius veins.   - No cystic structure found in the popliteal fossa.   ASSESSMENT: Location of DVT: Left femoral vein, Left popliteal vein, Left distal vein  Since DVT diagnosis and starting Xarelto , LE pain has improved. Pt developed moderate non-pitting edema following diagnosis, which is stable. Pt would benefit from wearing compression stockings and leg elevation. Patient is tolerating Xarelto  well, without any s/sx of bleeding. Labs were not drawn in the ED. Last CBC/CMET are from early 2024. Updated CBC and CMET today. No concerns for Xarelto  use. DVT most likely provoked by 3 days of immobility in the setting of unknown viral illness, which immediately preceded DVT symptom onset. However, pt also expresses concern about her aromatase inhibitor, anastrozole , potentially contributing to DVT risk. Aromatase inhibitors are associated with a 1-2% risk of VTE - will defer to Dr. Gudena for his opinion on risk of VTE with this medicine. Will also defer decision on duration of anticoagulation to Dr. Odean. Patient has been provided with $10 Xarelto  coupon card and refills have  been provided. No concerns for medication access or adherence at this time. Counseled patient extensively on Xarelto  and all of her questions have been answered.   PLAN: -Continue rivaroxaban  (Xarelto ) 15  mg twice daily with food for 21 days followed by 20 mg daily with food. -Expected duration of therapy: per hematology. Therapy started on 12/18/23. -Patient educated on purpose, proper use and potential adverse effects of rivaroxaban  (Xarelto ). -Discussed importance of taking medication around the same time every day. -Advised patient of medications to avoid (NSAIDs, aspirin doses >100 mg daily). -Educated that Tylenol  (acetaminophen ) is the preferred analgesic to lower the risk of bleeding. -Advised patient to alert all providers of anticoagulation therapy prior to starting a new medication or having a procedure. -Emphasized importance of monitoring for signs and symptoms of bleeding (abnormal bruising, prolonged bleeding, nose bleeds, bleeding from gums, discolored urine, black tarry stools). -Educated patient to present to the ED if emergent signs and symptoms of new thrombosis occur. -Counseled patient to wear compression stockings daily, removing at night.  Follow up:  Follow-up with Dr. Odean, DVT clinic available as needed  Lorain Baseman, PharmD PGY1 Pharmacy Resident  Lum Herald, PharmD, BCACP, CPP Deep Vein Thrombosis Clinic Clinical Pharmacist Practitioner Office: [phone]

## 2023-12-25 NOTE — Patient Instructions (Addendum)
--  Continue rivaroxaban  (Xarelto ) 15 mg twice daily with food for 21 days followed by 20 mg daily with food. -Your refills have been sent to [facility] at [facility]). You may need to call the pharmacy to ask them to fill this when you start to run low on your current supply.  -It is important to take your medication around the same time every day.  -Avoid NSAIDs like ibuprofen (Advil, Motrin) and naproxen (Aleve) as well as aspirin doses over 100 mg daily. -Tylenol  (acetaminophen ) is the preferred over the counter pain medication to lower the risk of bleeding. -Be sure to alert all of your health care providers that you are taking an anticoagulant prior to starting a new medication or having a procedure. -Monitor for signs and symptoms of bleeding (abnormal bruising, prolonged bleeding, nose bleeds, bleeding from gums, discolored urine, black tarry stools). If you have fallen and hit your head OR if your bleeding is severe or not stopping, seek emergency care.  -Go to the emergency room if emergent signs and symptoms of new clot occur (new or worse swelling and pain in an arm or leg, shortness of breath, chest pain, fast or irregular heartbeats, lightheadedness, dizziness, fainting, coughing up blood) or if you experience a significant color change (pale or blue) in the extremity that has the DVT.  -We recommend you wear compression stockings (20-30 mmHg) as long as you are having swelling or pain. Be sure to purchase the correct size and take them off at night.   If you have any questions or need to reschedule an appointment, please call [phone] [facility].  If you are having an emergency, call 911 or present to the nearest emergency room.   Information to find a primary care doctor: [email or web address]   What is a DVT?  -Deep vein thrombosis (DVT) is a condition in which a blood clot forms in a vein of the deep venous system which  can occur in the lower leg, thigh, pelvis, arm, or neck. This condition is serious and can be life-threatening if the clot travels to the arteries of the lungs and causing a blockage (pulmonary embolism, PE). A DVT can also damage veins in the leg, which can lead to long-term venous disease, leg pain, swelling, discoloration, and ulcers or sores (post-thrombotic syndrome).  -Treatment may include taking an anticoagulant medication to prevent more clots from forming and the current clot from growing, wearing compression stockings, and/or surgical procedures to remove or dissolve the clot.

## 2023-12-28 ENCOUNTER — Telehealth: Payer: Self-pay | Admitting: Hematology and Oncology

## 2023-12-28 NOTE — Telephone Encounter (Signed)
 Called and left message of patient schedule f/u visit for 2-4 weeks with Dr.Guden office. Date and time left on vm for patient. Office number left if rescheduling needed.

## 2023-12-29 ENCOUNTER — Other Ambulatory Visit: Payer: Self-pay | Admitting: Hematology and Oncology

## 2023-12-29 DIAGNOSIS — Z1231 Encounter for screening mammogram for malignant neoplasm of breast: Secondary | ICD-10-CM

## 2024-01-03 LAB — SIGNATERA
SIGNATERA MTM READOUT: 0 MTM/ml
SIGNATERA TEST RESULT: NEGATIVE

## 2024-01-04 ENCOUNTER — Other Ambulatory Visit (HOSPITAL_BASED_OUTPATIENT_CLINIC_OR_DEPARTMENT_OTHER): Payer: Self-pay

## 2024-01-08 ENCOUNTER — Telehealth: Payer: Self-pay

## 2024-01-08 NOTE — Telephone Encounter (Signed)
Attempted to call pt regarding [employer] results lvm for pt to return call back.  Madison Becker results was not detected. [employer] will be out in the next 6 months to repeat labs.

## 2024-01-13 ENCOUNTER — Other Ambulatory Visit (HOSPITAL_BASED_OUTPATIENT_CLINIC_OR_DEPARTMENT_OTHER): Payer: Self-pay

## 2024-01-13 ENCOUNTER — Ambulatory Visit
Admission: RE | Admit: 2024-01-13 | Discharge: 2024-01-13 | Disposition: A | Discharge status: Home / Self Care | Payer: BC Managed Care – PPO | Source: Ambulatory Visit | Attending: Hematology and Oncology

## 2024-01-13 DIAGNOSIS — Z1231 Encounter for screening mammogram for malignant neoplasm of breast: Secondary | ICD-10-CM

## 2024-01-18 ENCOUNTER — Other Ambulatory Visit (HOSPITAL_BASED_OUTPATIENT_CLINIC_OR_DEPARTMENT_OTHER): Payer: Self-pay

## 2024-01-25 ENCOUNTER — Telehealth: Payer: Self-pay

## 2024-01-25 ENCOUNTER — Other Ambulatory Visit: Payer: Self-pay

## 2024-01-25 DIAGNOSIS — Z170 Estrogen receptor positive status [ER+]: Secondary | ICD-10-CM

## 2024-01-25 NOTE — Telephone Encounter (Signed)
Pt called and states she needs to r/s her appt with MD because she is "not feeling well" states she has chills and body aches.   Pt r/s for 02/16/24 130 labs and 2 PM md visit.

## 2024-01-26 ENCOUNTER — Other Ambulatory Visit: Payer: BC Managed Care – PPO

## 2024-01-26 ENCOUNTER — Ambulatory Visit: Payer: BC Managed Care – PPO | Admitting: Hematology and Oncology

## 2024-02-15 ENCOUNTER — Telehealth: Payer: Self-pay

## 2024-02-15 NOTE — Telephone Encounter (Signed)
 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  She was r/s for 2/27 and is agreeable to the appts.

## 2024-02-16 ENCOUNTER — Ambulatory Visit: Payer: BC Managed Care – PPO | Admitting: Hematology and Oncology

## 2024-02-16 ENCOUNTER — Other Ambulatory Visit: Payer: BC Managed Care – PPO

## 2024-02-18 ENCOUNTER — Inpatient Hospital Stay: Payer: BC Managed Care – PPO | Attending: Hematology and Oncology

## 2024-02-18 ENCOUNTER — Inpatient Hospital Stay: Payer: BC Managed Care – PPO | Admitting: Hematology and Oncology

## 2024-02-18 VITALS — BP 155/72 | HR 61 | Temp 97.6°F | Resp 18 | Ht 64.0 in | Wt 208.3 lb

## 2024-02-18 DIAGNOSIS — Z170 Estrogen receptor positive status [ER+]: Secondary | ICD-10-CM | POA: Diagnosis not present

## 2024-02-18 DIAGNOSIS — C50212 Malignant neoplasm of upper-inner quadrant of left female breast: Secondary | ICD-10-CM | POA: Insufficient documentation

## 2024-02-18 DIAGNOSIS — M25512 Pain in left shoulder: Secondary | ICD-10-CM | POA: Insufficient documentation

## 2024-02-18 DIAGNOSIS — Z7901 Long term (current) use of anticoagulants: Secondary | ICD-10-CM | POA: Insufficient documentation

## 2024-02-18 DIAGNOSIS — I82402 Acute embolism and thrombosis of unspecified deep veins of left lower extremity: Secondary | ICD-10-CM | POA: Insufficient documentation

## 2024-02-18 DIAGNOSIS — Z832 Family history of diseases of the blood and blood-forming organs and certain disorders involving the immune mechanism: Secondary | ICD-10-CM | POA: Insufficient documentation

## 2024-02-18 DIAGNOSIS — Z79811 Long term (current) use of aromatase inhibitors: Secondary | ICD-10-CM | POA: Diagnosis not present

## 2024-02-18 DIAGNOSIS — I82412 Acute embolism and thrombosis of left femoral vein: Secondary | ICD-10-CM | POA: Diagnosis not present

## 2024-02-18 LAB — CMP (CANCER CENTER ONLY)
ALT: 17 U/L (ref 0–44)
AST: 15 U/L (ref 15–41)
Albumin: 3.8 g/dL (ref 3.5–5.0)
Alkaline Phosphatase: 67 U/L (ref 38–126)
Anion gap: 3 — ABNORMAL LOW (ref 5–15)
BUN: 12 mg/dL (ref 8–23)
CO2: 30 mmol/L (ref 22–32)
Calcium: 9.6 mg/dL (ref 8.9–10.3)
Chloride: 108 mmol/L (ref 98–111)
Creatinine: 0.96 mg/dL (ref 0.44–1.00)
GFR, Estimated: >60 mL/min (ref >60)
Glucose, Bld: 88 mg/dL (ref 70–99)
Potassium: 4.7 mmol/L (ref 3.5–5.1)
Sodium: 141 mmol/L (ref 135–145)
Total Bilirubin: 0.7 mg/dL (ref 0.0–1.2)
Total Protein: 7.1 g/dL (ref 6.5–8.1)

## 2024-02-18 LAB — CBC WITH DIFFERENTIAL (CANCER CENTER ONLY)
Abs Immature Granulocytes: 0.01 10*3/uL (ref 0.00–0.07)
Basophils Absolute: 0 10*3/uL (ref 0.0–0.1)
Basophils Relative: 1 %
Eosinophils Absolute: 0.1 10*3/uL (ref 0.0–0.5)
Eosinophils Relative: 2 %
HCT: 33.2 % — ABNORMAL LOW (ref 36.0–46.0)
Hemoglobin: 10.7 g/dL — ABNORMAL LOW (ref 12.0–15.0)
Immature Granulocytes: 0 %
Lymphocytes Relative: 38 %
Lymphs Abs: 1.2 10*3/uL (ref 0.7–4.0)
MCH: 28.2 pg (ref 26.0–34.0)
MCHC: 32.2 g/dL (ref 30.0–36.0)
MCV: 87.4 fL (ref 80.0–100.0)
Monocytes Absolute: 0.5 10*3/uL (ref 0.1–1.0)
Monocytes Relative: 15 %
Neutro Abs: 1.5 10*3/uL — ABNORMAL LOW (ref 1.7–7.7)
Neutrophils Relative %: 44 %
Platelet Count: 137 10*3/uL — ABNORMAL LOW (ref 150–400)
RBC: 3.8 MIL/uL — ABNORMAL LOW (ref 3.87–5.11)
RDW: 14.7 % (ref 11.5–15.5)
WBC Count: 3.3 10*3/uL — ABNORMAL LOW (ref 4.0–10.5)
nRBC: 0 % (ref 0.0–0.2)

## 2024-02-18 NOTE — Assessment & Plan Note (Signed)
 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  Adjuvant chemotherapy with dose dense Adriamycin and Cytoxan x4 followed by Taxol x12 2. adjuvant antiestrogen therapy with anastrozole 1 mg daily started 10/04/2022 --------------------------------------------------------------------------------------------------------------- Anastrozole toxicities: Tolerating it fairly well.   Breast cancer surveillance: Breast exam 02/18/2024: Benign Mammogram right breast: 01/14/2024: Benign breast density category B Bone density 10/01/2022: T-score -1.4   Left shoulder pain and decreased range of motion: Will refer her to physical therapy.   Patient went to see a primary care physician and was found to have elevated cholesterol and high blood pressure.  Also positive for ANA and she is being referred for rheumatology.   Return to clinic in 1 year for follow-up

## 2024-02-18 NOTE — Progress Notes (Signed)
 Patient Care Team: Leilani Able, MD as PCP - General (Family Medicine) Pershing Proud, RN as Oncology Nurse Navigator Donnelly Angelica, RN as Oncology Nurse Navigator Serena Croissant, MD as Consulting Physician (Hematology and Oncology) Abigail Miyamoto, MD as Consulting Physician (General Surgery)  DIAGNOSIS:  Encounter Diagnoses  Name Primary?   Malignant neoplasm of upper-inner quadrant of left breast in female, estrogen receptor positive (HCC) Yes   Acute deep vein thrombosis (DVT) of femoral vein of left lower extremity (HCC)     SUMMARY OF ONCOLOGIC HISTORY: Oncology History  Malignant neoplasm of upper-inner quadrant of left breast in female, estrogen receptor positive (HCC)  10/22/2021 Initial Diagnosis   Palpable left breast mass. Diagnostic mammogram and [place]: an irregular mass within the upper inner left breast, IDC 2.2 cm ER 99%, PR 99%, HER2 negative, Ki-67 50%.  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  Total area of involvement 6 cm   10/22/2021 Cancer Staging   Staging form: Breast, AJCC 8th Edition - Clinical stage from 10/22/2021: Stage IIA (cT2, cN0(f), cM0, G3, ER+, PR+, HER2-) - Signed by Serena Croissant, MD on 01/02/2022 Stage prefix: Initial diagnosis Method of lymph node assessment: Core biopsy Histologic grading system: 3 grade system   11/04/2021 Breast MRI   2.5 cm irregular enhancing mass UIQ left breast IDC with DCIS.  Additional non-mass enhancement extends anterior and inferior to the mass 2.5 cm total span 7.1 cm.   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, focal high-grade DCIS, margins negative, 1/2 lymph nodes positive ER 99%, PR 99%, HER2 negative, Ki-67 50%   03/24/2022 Oncotype testing   Oncotype DX recurrence score: 30 (distant recurrence at 9 years with hormone therapy alone: 23%   04/10/2022 - 08/21/2022 Chemotherapy   Patient is on Treatment Plan :  BREAST ADJUVANT DOSE DENSE AC q14d / PACLitaxel q7d     04/10/2022 - 09/04/2022 Chemotherapy   Patient is on Treatment Plan : BREAST ADJUVANT DOSE DENSE AC q14d / PACLitaxel q7d     10/04/2022 -  Anti-estrogen oral therapy   Anastrozole     CHIEF COMPLIANT: Recent DVT  HISTORY OF PRESENT ILLNESS: Madison Becker is a 68 year old female with breast cancer who presents for follow-up and management of a recent DVT.  She recently experienced a deep vein thrombosis (DVT) in her left lower extremity, diagnosed in December after presenting to the emergency room with leg pain and swelling. She describes a sharp burning sensation in her leg. She is currently on Xarelto 20 mg daily for anticoagulation. Her leg did not swell significantly, but there was noticeable swelling compared to the other leg. She has started wearing compression socks, which have helped alleviate symptoms.  She has a history of breast cancer and has been on anastrozole for the past year and a half. She experiences occasional hot flashes, which are not frequent or bothersome. No significant breast pain or discomfort, although she occasionally experiences sharp, sudden pains that resolve quickly. Her most recent mammogram in January was normal.  Her family history is notable for her mother having had issues with blood clots.  She works a Office manager, which involves prolonged periods of sitting, and she speculates that this may have contributed to her DVT.    ALLERGIES:  is allergic to beef-derived drug products and pork-derived products.  MEDICATIONS:  Current Outpatient Medications  Medication Sig Dispense  Refill   acetaminophen (TYLENOL) 500 MG tablet Take 1,000 mg by mouth every 6 (six) hours as needed for mild pain.     anastrozole (ARIMIDEX) 1 MG tablet Take 1 tablet (1 mg total) by mouth daily. 90 tablet 3   hydrochlorothiazide (HYDRODIURIL) 25 MG tablet Take 1 tablet (25 mg total) by mouth daily.     Multiple Vitamin  (MULTIVITAMIN) capsule Take 1 capsule by mouth daily.     Multiple Vitamin (MULTIVITAMIN) capsule Take 1 capsule by mouth daily.     OVER THE COUNTER MEDICATION B 12 one tablet every other day.     rivaroxaban (XARELTO) 20 MG TABS tablet Take 1 tablet (20 mg total) by mouth daily with supper. Take with food. Start taking after completion of starter pack. 30 tablet 5   VITAMIN D PO Take by mouth.     No current facility-administered medications for this visit.    PHYSICAL EXAMINATION: ECOG PERFORMANCE STATUS: 1 - Symptomatic but completely ambulatory  Vitals:   02/18/24 1002  BP: (!) 155/72  Pulse: 61  Resp: 18  Temp: 97.6 F (36.4 C)  SpO2: 100%   Filed Weights   02/18/24 1002  Weight: 208 lb 4.8 oz (94.5 kg)    Physical Exam   (exam performed in the presence of a chaperone)  LABORATORY DATA:  I have reviewed the data as listed    Latest Ref Rng & Units 02/18/2024    9:08 AM 12/25/2023    2:20 PM 09/04/2022   11:46 AM  CMP  Glucose 70 - 99 mg/dL 88  93  161   BUN 8 - 23 mg/dL 12  10  17    Creatinine 0.44 - 1.00 mg/dL 0.96  0.45  4.09   Sodium 135 - 145 mmol/L 141  137  141   Potassium 3.5 - 5.1 mmol/L 4.7  4.2  3.7   Chloride 98 - 111 mmol/L 108  101  109   CO2 22 - 32 mmol/L 30  28  27    Calcium 8.9 - 10.3 mg/dL 9.6  9.5  9.2   Total Protein 6.5 - 8.1 g/dL 7.1  7.3  6.4   Total Bilirubin 0.0 - 1.2 mg/dL 0.7  0.7  0.5   Alkaline Phos 38 - 126 U/L 67  63  63   AST 15 - 41 U/L 15  15  17    ALT 0 - 44 U/L 17  16  17      Lab Results  Component Value Date   WBC 3.3 (L) 02/18/2024   HGB 10.7 (L) 02/18/2024   HCT 33.2 (L) 02/18/2024   MCV 87.4 02/18/2024   PLT 137 (L) 02/18/2024   NEUTROABS 1.5 (L) 02/18/2024    ASSESSMENT & PLAN:  Malignant neoplasm of upper-inner quadrant of left breast in female, estrogen receptor positive (HCC) 03/05/2022:Left mastectomy: 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  Adjuvant chemotherapy with dose dense Adriamycin and Cytoxan x4 followed by Taxol x12 2. adjuvant antiestrogen therapy with anastrozole 1 mg daily started 10/04/2022 --------------------------------------------------------------------------------------------------------------- Anastrozole toxicities: Tolerating it fairly well.   Breast cancer surveillance: Breast exam 02/18/2024: Benign Mammogram right breast: 01/14/2024: Benign breast density category B Bone density 10/01/2022: T-score -1.4   Left shoulder pain and decreased range of motion: Will refer  her to physical therapy.   Recent DVT December 2024: Started on Xarelto.  Most likely cause is her sedentary notes.  She is wearing compression socks.  Our plan is to treat her with 6 months of anticoagulation and recheck with an ultrasound.   Telephone follow-up after the leg ultrasound to discuss results.     No orders of the defined types were placed in this encounter.  The patient has a good understanding of the overall plan. she agrees with it. she will call with any problems that may develop before the next visit here. Total time spent: 30 mins including face to face time and time spent for planning, charting and co-ordination of care   Tamsen Meek, MD 02/18/24

## 2024-02-19 ENCOUNTER — Telehealth: Payer: Self-pay | Admitting: Hematology and Oncology

## 2024-02-19 NOTE — Telephone Encounter (Signed)
 Rescheduled appointment per 2/27 los. Patient is aware of the changes made to her upcoming appointment.

## 2024-03-09 ENCOUNTER — Encounter: Payer: Self-pay | Admitting: *Deleted

## 2024-03-09 NOTE — Progress Notes (Signed)
Receive a message from [employer] representative stating patient did not respond to their call to schedule mobile phlebotomy draw.  Representative states patient will need to reach out to [employer] directly at [phone] option #1 [phone] to schedule if patient wishes to proceed.   

## 2024-03-22 ENCOUNTER — Encounter: Payer: Self-pay | Admitting: *Deleted

## 2024-03-22 NOTE — Progress Notes (Signed)
Receive a message from [employer] representative stating patient did not respond to their call to schedule mobile phlebotomy draw.  Representative states patient will need to reach out to [employer] directly at [phone] option #1 [phone] to schedule if patient wishes to proceed.   

## 2024-06-07 ENCOUNTER — Ambulatory Visit: Payer: Medicaid Other | Admitting: Hematology and Oncology

## 2024-06-15 ENCOUNTER — Ambulatory Visit (HOSPITAL_COMMUNITY)
Admission: RE | Admit: 2024-06-15 | Discharge: 2024-06-15 | Disposition: A | Discharge status: Home / Self Care | Payer: BC Managed Care – PPO | Source: Ambulatory Visit | Attending: Hematology and Oncology | Admitting: Hematology and Oncology

## 2024-06-15 DIAGNOSIS — I82412 Acute embolism and thrombosis of left femoral vein: Secondary | ICD-10-CM | POA: Insufficient documentation

## 2024-06-21 NOTE — Assessment & Plan Note (Signed)
 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  Adjuvant chemotherapy with dose dense Adriamycin  and Cytoxan  x4 followed by Taxol  x12 2. adjuvant antiestrogen therapy with anastrozole  1 mg daily started 10/04/2022 --------------------------------------------------------------------------------------------------------------- Anastrozole  toxicities: Tolerating it fairly well.   Breast cancer surveillance: Breast exam 02/18/2024: Benign Mammogram right breast: 01/14/2024: Benign breast density category B Bone density 10/01/2022: T-score -1.4   Left shoulder pain and decreased range of motion: Will refer her to physical therapy.   Recent DVT December 2024: Started on Xarelto .  Most likely cause is her sedentary notes.  She is wearing compression socks.  Our plan is to treat her with 6 months of anticoagulation and recheck with an ultrasound.  06/15/24: vascular US : No evidence of DVT Ok to stop anticoagulation

## 2024-06-22 ENCOUNTER — Other Ambulatory Visit (HOSPITAL_BASED_OUTPATIENT_CLINIC_OR_DEPARTMENT_OTHER): Payer: Self-pay

## 2024-06-22 ENCOUNTER — Inpatient Hospital Stay: Payer: Self-pay | Attending: Hematology and Oncology | Admitting: Hematology and Oncology

## 2024-06-22 VITALS — BP 135/68 | HR 72 | Temp 98.3°F | Resp 17 | Ht 64.0 in | Wt 212.7 lb

## 2024-06-22 DIAGNOSIS — C50212 Malignant neoplasm of upper-inner quadrant of left female breast: Secondary | ICD-10-CM | POA: Diagnosis present

## 2024-06-22 DIAGNOSIS — Z7901 Long term (current) use of anticoagulants: Secondary | ICD-10-CM | POA: Diagnosis not present

## 2024-06-22 DIAGNOSIS — M25512 Pain in left shoulder: Secondary | ICD-10-CM | POA: Diagnosis not present

## 2024-06-22 DIAGNOSIS — Z170 Estrogen receptor positive status [ER+]: Secondary | ICD-10-CM | POA: Diagnosis not present

## 2024-06-22 DIAGNOSIS — Z79811 Long term (current) use of aromatase inhibitors: Secondary | ICD-10-CM | POA: Insufficient documentation

## 2024-06-22 DIAGNOSIS — I82412 Acute embolism and thrombosis of left femoral vein: Secondary | ICD-10-CM | POA: Insufficient documentation

## 2024-06-22 MED ORDER — RIVAROXABAN 20 MG PO TABS
20.0000 mg | ORAL_TABLET | Freq: Every day | ORAL | 5 refills | Status: AC
  Filled 2024-06-22 – 2024-07-02 (×2): qty 30, 30d supply, fill #0

## 2024-06-22 MED ORDER — ANASTROZOLE 1 MG PO TABS
1.0000 mg | ORAL_TABLET | Freq: Every day | ORAL | 3 refills | Status: AC
  Filled 2024-06-22 – 2024-07-02 (×2): qty 90, 90d supply, fill #0

## 2024-06-22 NOTE — Progress Notes (Signed)
 Patient Care Team: Ilah Crigler, MD as PCP - General (Family Medicine) Glean Stephane BROCKS, RN (Inactive) as Oncology Nurse Navigator Tyree Nanetta SAILOR, RN as Oncology Nurse Navigator Odean Potts, MD as Consulting Physician (Hematology and Oncology) Vernetta Berg, MD as Consulting Physician (General Surgery)  DIAGNOSIS:  Encounter Diagnosis  Name Primary?   Malignant neoplasm of upper-inner quadrant of left breast in female, estrogen receptor positive (HCC) Yes    SUMMARY OF ONCOLOGIC HISTORY: Oncology History  Malignant neoplasm of upper-inner quadrant of left breast in female, estrogen receptor positive (HCC)  10/22/2021 Initial Diagnosis   Palpable left breast mass. Diagnostic mammogram and US : an irregular mass within the upper inner left breast, IDC 2.2 cm ER 99%, PR 99%, HER2 negative, Ki-67 50%.  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  Total area of involvement 6 cm   10/22/2021 Cancer Staging   Staging form: Breast, AJCC 8th Edition - Clinical stage from 10/22/2021: Stage IIA (cT2, cN0(f), cM0, G3, ER+, PR+, HER2-) - Signed by Odean Potts, MD on 01/02/2022 Stage prefix: Initial diagnosis Method of lymph node assessment: Core biopsy Histologic grading system: 3 grade system   11/04/2021 Breast MRI   2.5 cm irregular enhancing mass UIQ left breast IDC with DCIS.  Additional non-mass enhancement extends anterior and inferior to the mass 2.5 cm total span 7.1 cm.   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, focal high-grade DCIS, margins negative, 1/2 lymph nodes positive ER 99%, PR 99%, HER2 negative, Ki-67 50%   03/24/2022 Oncotype testing   Oncotype DX recurrence score: 30 (distant recurrence at 9 years with hormone therapy alone: 23%   04/10/2022 - 08/21/2022 Chemotherapy   Patient is on Treatment Plan : BREAST ADJUVANT DOSE DENSE AC q14d / PACLitaxel  q7d     04/10/2022 -  09/04/2022 Chemotherapy   Patient is on Treatment Plan : BREAST ADJUVANT DOSE DENSE AC q14d / PACLitaxel  q7d     10/04/2022 -  Anti-estrogen oral therapy   Anastrozole      CHIEF COMPLIANT: Follow-up after recent ultrasound of the leg for blood clot, history of breast cancer on anastrozole   HISTORY OF PRESENT ILLNESS:  History of Present Illness Madison Becker is a 68 year old female with a history of blood clots who presents for follow-up on her anticoagulation therapy.  Her recent ultrasound shows improvement with some clotting still present around the ankle and noticeable movement where the clot was previously located. She is on Xarelto  but occasionally forgets the name of the medication. She frequently wears compression socks, noting symptom differences when she does.   She experiences tension and a pulling sensation on her left breast where she had surgery, with occasional jabs of pain or tenderness.  Tolerating anastrozole  extremely well     ALLERGIES:  is allergic to beef-derived drug products and pork-derived products.  MEDICATIONS:  Current Outpatient Medications  Medication Sig Dispense Refill   acetaminophen  (TYLENOL ) 500 MG tablet Take 1,000 mg by mouth every 6 (six) hours as needed for mild pain.     anastrozole  (ARIMIDEX ) 1 MG tablet Take 1 tablet (1 mg total) by mouth daily. 90 tablet 3   hydrochlorothiazide  (HYDRODIURIL ) 25 MG tablet Take 1 tablet (25 mg total) by mouth daily.     Multiple Vitamin (MULTIVITAMIN) capsule Take 1 capsule by mouth daily.     Multiple Vitamin (MULTIVITAMIN) capsule Take 1 capsule by mouth  daily.     OVER THE COUNTER MEDICATION B 12 one tablet every other day.     rivaroxaban  (XARELTO ) 20 MG TABS tablet Take 1 tablet (20 mg total) by mouth daily with supper. Take with food. Start taking after completion of starter pack. 30 tablet 5   VITAMIN D PO Take by mouth.     No current facility-administered medications for this visit.    PHYSICAL  EXAMINATION: ECOG PERFORMANCE STATUS: 1 - Symptomatic but completely ambulatory  Vitals:   06/22/24 0828  BP: 135/68  Pulse: 72  Resp: 17  Temp: 98.3 F (36.8 C)  SpO2: 100%   Filed Weights   06/22/24 0828  Weight: 212 lb 11.2 oz (96.5 kg)    LABORATORY DATA:  I have reviewed the data as listed    Latest Ref Rng & Units 02/18/2024    9:08 AM 12/25/2023    2:20 PM 09/04/2022   11:46 AM  CMP  Glucose 70 - 99 mg/dL 88  93  895   BUN 8 - 23 mg/dL 12  10  17    Creatinine 0.44 - 1.00 mg/dL 9.03  9.07  9.13   Sodium 135 - 145 mmol/L 141  137  141   Potassium 3.5 - 5.1 mmol/L 4.7  4.2  3.7   Chloride 98 - 111 mmol/L 108  101  109   CO2 22 - 32 mmol/L 30  28  27    Calcium 8.9 - 10.3 mg/dL 9.6  9.5  9.2   Total Protein 6.5 - 8.1 g/dL 7.1  7.3  6.4   Total Bilirubin 0.0 - 1.2 mg/dL 0.7  0.7  0.5   Alkaline Phos 38 - 126 U/L 67  63  63   AST 15 - 41 U/L 15  15  17    ALT 0 - 44 U/L 17  16  17      Lab Results  Component Value Date   WBC 3.3 (L) 02/18/2024   HGB 10.7 (L) 02/18/2024   HCT 33.2 (L) 02/18/2024   MCV 87.4 02/18/2024   PLT 137 (L) 02/18/2024   NEUTROABS 1.5 (L) 02/18/2024    ASSESSMENT & PLAN:  Malignant neoplasm of upper-inner quadrant of left breast in female, estrogen receptor positive (HCC) 03/05/2022:Left mastectomy: 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  Adjuvant chemotherapy with dose dense Adriamycin  and Cytoxan  x4 followed by Taxol  x12 2. adjuvant antiestrogen therapy with anastrozole  1 mg daily started 10/04/2022 --------------------------------------------------------------------------------------------------------------- Anastrozole  toxicities: Tolerating it fairly well.   Breast cancer surveillance: Breast exam 02/18/2024: Benign Mammogram  right breast: 01/14/2024: Benign breast density category B Bone density 10/01/2022: T-score -1.4   Left shoulder pain and decreased range of motion: Improved   DVT December 2024: Started on Xarelto .  Most likely cause is her sedentary notes.  She is wearing compression socks.   06/15/24: vascular US : chronic deep vein thrombosis involving the left femoral vein, left popliteal vein, and left posterior tibial veins. 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 Continue anticoagulation for 6 months and recheck with another ultrasound. ------------------------------------- Assessment and Plan Assessment & Plan Acute DVT of left femoral vein Ultrasound shows improvement with residual clotting around the ankle. No clear risk factor identified, complicating anticoagulation duration decision. Highest recurrence risk within two  months post-anticoagulation cessation. - Continue Rivaroxaban  20 mg oral daily with supper for one year. - Order ultrasound in six months to reassess DVT. - Encourage wearing compression socks regularly. - Send six months' worth of Rivaroxaban  prescription to [facility].  Malignant neoplasm of left breast, estrogen receptor positive Reports tension and pulling sensations on the left side post-surgery, likely due to scar tissue. Last mammogram in January was normal with low density, reducing recurrence concern. - Continue Anastrozole  1 mg oral daily. - Send prescription refill for Anastrozole  to [facility].      No orders of the defined types were placed in this encounter.  The patient has a good understanding of the overall plan. she agrees with it. she will call with any problems that may develop before the next visit here. Total time spent: 30 mins including face to face time and time spent for planning, charting and co-ordination of care   Naomi MARLA Chad, MD 06/22/24

## 2024-07-02 ENCOUNTER — Other Ambulatory Visit (HOSPITAL_BASED_OUTPATIENT_CLINIC_OR_DEPARTMENT_OTHER): Payer: Self-pay

## 2024-07-18 LAB — SIGNATERA
SIGNATERA MTM READOUT: 0 MTM/ml
SIGNATERA TEST RESULT: NEGATIVE

## 2024-12-27 DIAGNOSIS — Z170 Estrogen receptor positive status [ER+]: Secondary | ICD-10-CM

## 2024-12-27 NOTE — Progress Notes (Signed)
 Signater[employer] renewal orders placed.

## 2024-12-27 NOTE — Assessment & Plan Note (Signed)
 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  Adjuvant chemotherapy with dose dense Adriamycin  and Cytoxan  x4 followed by Taxol  x12 2. adjuvant antiestrogen therapy with anastrozole  1 mg daily started 10/04/2022 --------------------------------------------------------------------------------------------------------------- Anastrozole  toxicities: Tolerating it fairly well.   Breast cancer surveillance: Breast exam 12/28/24 Benign Mammogram right breast: 01/13/2024: Benign breast density category B Bone density 10/01/2022: T-score -1.4   Left shoulder pain and decreased range of motion: Improved   DVT December 2024: Started on Xarelto .  Most likely cause is her sedentary notes.  She is wearing compression socks.   06/15/24: vascular US : chronic deep vein thrombosis involving the left femoral vein, left popliteal vein, and left posterior tibial veins. 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 Continue anticoagulation for 6 months and recheck with another ultrasound.

## 2024-12-28 ENCOUNTER — Inpatient Hospital Stay: Attending: Hematology and Oncology | Admitting: Hematology and Oncology

## 2024-12-28 DIAGNOSIS — Z170 Estrogen receptor positive status [ER+]: Secondary | ICD-10-CM

## 2024-12-28 NOTE — Progress Notes (Signed)
 Telephone call: Patient has a voicemail that has not been set up.  Unable to connect with the patient.
